# Patient Record
Sex: Male | Born: 1950 | Race: Black or African American | Hispanic: No | Marital: Married | Smoking: Former smoker
Health system: Southern US, Community
[De-identification: ages and names within clinical notes are randomized; demographics above are authoritative.]

## PROBLEM LIST (undated history)

## (undated) DIAGNOSIS — C801 Malignant (primary) neoplasm, unspecified: Secondary | ICD-10-CM

## (undated) DIAGNOSIS — I82401 Acute embolism and thrombosis of unspecified deep veins of right lower extremity: Secondary | ICD-10-CM

## (undated) DIAGNOSIS — D649 Anemia, unspecified: Secondary | ICD-10-CM

---

## 2008-04-16 HISTORY — PX: DENTAL SURGERY: SHX609

## 2018-04-14 ENCOUNTER — Inpatient Hospital Stay (HOSPITAL_COMMUNITY)
Admission: EM | Admit: 2018-04-14 | Discharge: 2018-04-22 | DRG: 329 | Disposition: A | Discharge status: Home / Self Care | Payer: PRIVATE HEALTH INSURANCE | Attending: Internal Medicine | Admitting: Internal Medicine

## 2018-04-14 ENCOUNTER — Encounter (HOSPITAL_COMMUNITY): Payer: Self-pay | Admitting: Emergency Medicine

## 2018-04-14 ENCOUNTER — Emergency Department (HOSPITAL_COMMUNITY): Payer: PRIVATE HEALTH INSURANCE

## 2018-04-14 DIAGNOSIS — K5669 Other partial intestinal obstruction: Secondary | ICD-10-CM | POA: Diagnosis not present

## 2018-04-14 DIAGNOSIS — N289 Disorder of kidney and ureter, unspecified: Secondary | ICD-10-CM

## 2018-04-14 DIAGNOSIS — Z681 Body mass index (BMI) 19 or less, adult: Secondary | ICD-10-CM

## 2018-04-14 DIAGNOSIS — C772 Secondary and unspecified malignant neoplasm of intra-abdominal lymph nodes: Secondary | ICD-10-CM | POA: Diagnosis present

## 2018-04-14 DIAGNOSIS — N3289 Other specified disorders of bladder: Secondary | ICD-10-CM | POA: Insufficient documentation

## 2018-04-14 DIAGNOSIS — R630 Anorexia: Secondary | ICD-10-CM | POA: Diagnosis present

## 2018-04-14 DIAGNOSIS — R531 Weakness: Secondary | ICD-10-CM

## 2018-04-14 DIAGNOSIS — Z91018 Allergy to other foods: Secondary | ICD-10-CM

## 2018-04-14 DIAGNOSIS — K56609 Unspecified intestinal obstruction, unspecified as to partial versus complete obstruction: Secondary | ICD-10-CM

## 2018-04-14 DIAGNOSIS — Z978 Presence of other specified devices: Secondary | ICD-10-CM

## 2018-04-14 DIAGNOSIS — N131 Hydronephrosis with ureteral stricture, not elsewhere classified: Secondary | ICD-10-CM | POA: Diagnosis present

## 2018-04-14 DIAGNOSIS — K56600 Partial intestinal obstruction, unspecified as to cause: Secondary | ICD-10-CM | POA: Diagnosis present

## 2018-04-14 DIAGNOSIS — C7802 Secondary malignant neoplasm of left lung: Secondary | ICD-10-CM | POA: Diagnosis present

## 2018-04-14 DIAGNOSIS — C7801 Secondary malignant neoplasm of right lung: Secondary | ICD-10-CM | POA: Diagnosis present

## 2018-04-14 DIAGNOSIS — Z515 Encounter for palliative care: Secondary | ICD-10-CM

## 2018-04-14 DIAGNOSIS — R918 Other nonspecific abnormal finding of lung field: Secondary | ICD-10-CM

## 2018-04-14 DIAGNOSIS — D649 Anemia, unspecified: Secondary | ICD-10-CM | POA: Diagnosis present

## 2018-04-14 DIAGNOSIS — E876 Hypokalemia: Secondary | ICD-10-CM | POA: Diagnosis not present

## 2018-04-14 DIAGNOSIS — R809 Proteinuria, unspecified: Secondary | ICD-10-CM | POA: Diagnosis present

## 2018-04-14 DIAGNOSIS — Z7982 Long term (current) use of aspirin: Secondary | ICD-10-CM

## 2018-04-14 DIAGNOSIS — D500 Iron deficiency anemia secondary to blood loss (chronic): Secondary | ICD-10-CM | POA: Diagnosis present

## 2018-04-14 DIAGNOSIS — Z23 Encounter for immunization: Secondary | ICD-10-CM

## 2018-04-14 DIAGNOSIS — C787 Secondary malignant neoplasm of liver and intrahepatic bile duct: Secondary | ICD-10-CM | POA: Diagnosis present

## 2018-04-14 DIAGNOSIS — C679 Malignant neoplasm of bladder, unspecified: Secondary | ICD-10-CM | POA: Diagnosis present

## 2018-04-14 DIAGNOSIS — J189 Pneumonia, unspecified organism: Secondary | ICD-10-CM | POA: Diagnosis not present

## 2018-04-14 DIAGNOSIS — R3129 Other microscopic hematuria: Secondary | ICD-10-CM | POA: Diagnosis present

## 2018-04-14 DIAGNOSIS — K6389 Other specified diseases of intestine: Secondary | ICD-10-CM | POA: Diagnosis not present

## 2018-04-14 DIAGNOSIS — Z7189 Other specified counseling: Secondary | ICD-10-CM

## 2018-04-14 DIAGNOSIS — R634 Abnormal weight loss: Secondary | ICD-10-CM | POA: Diagnosis present

## 2018-04-14 DIAGNOSIS — C187 Malignant neoplasm of sigmoid colon: Secondary | ICD-10-CM

## 2018-04-14 DIAGNOSIS — N189 Chronic kidney disease, unspecified: Secondary | ICD-10-CM | POA: Diagnosis present

## 2018-04-14 DIAGNOSIS — R160 Hepatomegaly, not elsewhere classified: Secondary | ICD-10-CM | POA: Diagnosis present

## 2018-04-14 DIAGNOSIS — N138 Other obstructive and reflux uropathy: Secondary | ICD-10-CM | POA: Diagnosis not present

## 2018-04-14 DIAGNOSIS — E870 Hyperosmolality and hypernatremia: Secondary | ICD-10-CM | POA: Diagnosis not present

## 2018-04-14 DIAGNOSIS — R000 Tachycardia, unspecified: Secondary | ICD-10-CM | POA: Diagnosis present

## 2018-04-14 DIAGNOSIS — K228 Other specified diseases of esophagus: Secondary | ICD-10-CM | POA: Diagnosis present

## 2018-04-14 DIAGNOSIS — K632 Fistula of intestine: Secondary | ICD-10-CM | POA: Diagnosis present

## 2018-04-14 DIAGNOSIS — E860 Dehydration: Secondary | ICD-10-CM | POA: Diagnosis present

## 2018-04-14 DIAGNOSIS — Z5111 Encounter for antineoplastic chemotherapy: Secondary | ICD-10-CM

## 2018-04-14 DIAGNOSIS — N179 Acute kidney failure, unspecified: Secondary | ICD-10-CM | POA: Diagnosis present

## 2018-04-14 DIAGNOSIS — K219 Gastro-esophageal reflux disease without esophagitis: Secondary | ICD-10-CM | POA: Diagnosis present

## 2018-04-14 DIAGNOSIS — R1900 Intra-abdominal and pelvic swelling, mass and lump, unspecified site: Secondary | ICD-10-CM

## 2018-04-14 DIAGNOSIS — Z0189 Encounter for other specified special examinations: Secondary | ICD-10-CM

## 2018-04-14 LAB — COMPREHENSIVE METABOLIC PANEL
ALT: 26 U/L (ref 0–44)
AST: 47 U/L — ABNORMAL HIGH (ref 15–41)
Albumin: 3.4 g/dL — ABNORMAL LOW (ref 3.5–5.0)
Alkaline Phosphatase: 83 U/L (ref 38–126)
Anion gap: 15 (ref 5–15)
BUN: 75 mg/dL — ABNORMAL HIGH (ref 8–23)
CO2: 18 mmol/L — ABNORMAL LOW (ref 22–32)
Calcium: 9 mg/dL (ref 8.9–10.3)
Chloride: 111 mmol/L (ref 98–111)
Creatinine, Ser: 2.83 mg/dL — ABNORMAL HIGH (ref 0.61–1.24)
GFR calc Af Amer: 25 mL/min — ABNORMAL LOW (ref >60)
GFR calc non Af Amer: 22 mL/min — ABNORMAL LOW (ref >60)
Glucose, Bld: 125 mg/dL — ABNORMAL HIGH (ref 70–99)
Potassium: 4 mmol/L (ref 3.5–5.1)
SODIUM: 144 mmol/L (ref 135–145)
Total Bilirubin: 1 mg/dL (ref 0.3–1.2)
Total Protein: 7.3 g/dL (ref 6.5–8.1)

## 2018-04-14 LAB — CBC
HCT: 29.3 % — ABNORMAL LOW (ref 39.0–52.0)
Hemoglobin: 8.9 g/dL — ABNORMAL LOW (ref 13.0–17.0)
MCH: 29.2 pg (ref 26.0–34.0)
MCHC: 30.4 g/dL (ref 30.0–36.0)
MCV: 96.1 fL (ref 80.0–100.0)
Platelets: 315 10*3/uL (ref 150–400)
RBC: 3.05 MIL/uL — ABNORMAL LOW (ref 4.22–5.81)
RDW: 15 % (ref 11.5–15.5)
WBC: 20.3 10*3/uL — ABNORMAL HIGH (ref 4.0–10.5)
nRBC: 0 % (ref 0.0–0.2)

## 2018-04-14 LAB — POC OCCULT BLOOD, ED: Fecal Occult Bld: NEGATIVE

## 2018-04-14 LAB — URINALYSIS, ROUTINE W REFLEX MICROSCOPIC
Bilirubin Urine: NEGATIVE
Glucose, UA: NEGATIVE mg/dL
Ketones, ur: NEGATIVE mg/dL
Leukocytes, UA: NEGATIVE
Nitrite: NEGATIVE
PROTEIN: 100 mg/dL — AB
RBC / HPF: >50 RBC/hpf — ABNORMAL HIGH (ref 0–5)
Specific Gravity, Urine: 1.021 (ref 1.005–1.030)
pH: 5 (ref 5.0–8.0)

## 2018-04-14 LAB — I-STAT CG4 LACTIC ACID, ED
Lactic Acid, Venous: 3.65 mmol/L (ref 0.5–1.9)
Lactic Acid, Venous: 1.86 mmol/L (ref 0.5–1.9)

## 2018-04-14 LAB — LIPASE, BLOOD: Lipase: 23 U/L (ref 11–51)

## 2018-04-14 MED ORDER — SODIUM CHLORIDE 0.9 % IV SOLN
1.0000 g | Freq: Every day | INTRAVENOUS | Status: DC
  Administered 2018-04-14 – 2018-04-18 (×5): 1 g via INTRAVENOUS
  Filled 2018-04-14: qty 10
  Filled 2018-04-14 (×4): qty 1

## 2018-04-14 MED ORDER — MORPHINE SULFATE (PF) 2 MG/ML IV SOLN
1.0000 mg | INTRAVENOUS | Status: DC | PRN
  Filled 2018-04-14: qty 1

## 2018-04-14 MED ORDER — ACETAMINOPHEN 650 MG RE SUPP: 650.0000 mg | Freq: Four times a day (QID) | RECTAL | Status: DC | PRN

## 2018-04-14 MED ORDER — SODIUM CHLORIDE 0.9 % IV BOLUS
1000.0000 mL | Freq: Once | INTRAVENOUS | Status: AC
  Administered 2018-04-14: 1000 mL via INTRAVENOUS

## 2018-04-14 MED ORDER — SODIUM CHLORIDE 0.9 % IV SOLN
INTRAVENOUS | Status: AC
  Administered 2018-04-14 – 2018-04-15 (×2): via INTRAVENOUS

## 2018-04-14 MED ORDER — ONDANSETRON HCL 4 MG PO TABS: 4.0000 mg | ORAL_TABLET | Freq: Four times a day (QID) | ORAL | Status: DC | PRN

## 2018-04-14 MED ORDER — ONDANSETRON HCL 4 MG/2ML IJ SOLN: 4.0000 mg | Freq: Four times a day (QID) | INTRAMUSCULAR | Status: DC | PRN

## 2018-04-14 MED ORDER — SODIUM CHLORIDE 0.9 % IV SOLN
500.0000 mg | Freq: Every day | INTRAVENOUS | Status: DC
  Administered 2018-04-14 – 2018-04-18 (×5): 500 mg via INTRAVENOUS
  Filled 2018-04-14 (×5): qty 500

## 2018-04-14 MED ORDER — ACETAMINOPHEN 325 MG PO TABS: 650.0000 mg | ORAL_TABLET | Freq: Four times a day (QID) | ORAL | Status: DC | PRN

## 2018-04-14 MED ORDER — LORAZEPAM 0.5 MG PO TABS: 0.5000 mg | ORAL_TABLET | Freq: Four times a day (QID) | ORAL | Status: DC | PRN

## 2018-04-14 MED ORDER — HEPARIN SODIUM (PORCINE) 5000 UNIT/ML IJ SOLN: 5000.0000 [IU] | Freq: Three times a day (TID) | INTRAMUSCULAR | Status: DC

## 2018-04-14 NOTE — ED Triage Notes (Signed)
Patient here from [facility] with complaints of weight loss of 30lbs in 4 months. Abd pain all over. Abd x-ray and chest x-ray done. Elevated WBC, and low Hgb.

## 2018-04-14 NOTE — ED Provider Notes (Addendum)
[facility] DEPT Provider Note   CSN: [id number] Arrival date & time: 04/14/18  1648     History   Chief Complaint Chief Complaint  Patient presents with  . Weight Loss  . Abdominal Pain  . Abnormal Lab    HPI Miguel Gamble is a 67 y.o. male presenting for evaluation of weight loss, decreased appetite, and abnormal bowel movements.  Patient states that the past 3 months, he has had a 40 pound weight loss.  This began after he felt like he had a food reaction to eating lots of garlic.  He had several months of diarrhea and weight loss, although this improved slightly before he started losing weight again.  Over the past 2 months, he reports decreased appetite.  He states his abdomen feels swollen.  As symptoms are worsening, he went to [facility] walk-in clinic today, where he was found to have abnormal labs including leukocytosis and anemia.  X-ray was abnormal, possible bronchogenic pna.  X-ray of the abdomen showed distended bowel, possible obstruction.  As such, is recommended he come to the ER.  Patient states he is currently without abdominal pain.  He reports some mild nausea, but no vomiting.  He denies fevers, chills, chest pain, shortness of breath, cough, or urinary symptoms.  Patient states he has no medical problems, takes no medications daily. He denies night sweats.   HPI  History reviewed. No pertinent past medical history.  There are no active problems to display for this patient.   History reviewed. No pertinent surgical history.      Home Medications    Prior to Admission medications   Medication Sig Start Date End Date Taking? Authorizing Provider  aspirin 325 MG EC tablet Take 325 mg by mouth every 6 (six) hours as needed for pain.   Yes [provider]    Family History No family history on file.  Social History Social History   Tobacco Use  . Smoking status: Never Smoker  . Smokeless tobacco: Never Used    Substance Use Topics  . Alcohol use: Not on file  . Drug use: Not on file     Allergies   Other   Review of Systems Review of Systems  Constitutional: Positive for appetite change and unexpected weight change.  Gastrointestinal: Positive for abdominal distention and nausea.  All other systems reviewed and are negative.    Physical Exam Updated Vital Signs BP 133/82   Pulse (!) 103   Temp 97.8 F (36.6 C) (Oral)   Resp 18   Ht 5\' 8"  (1.727 m)   Wt 57.2 kg   SpO2 98%   BMI 19.16 kg/m   Physical Exam Vitals signs and nursing note reviewed.  Constitutional:      General: He is not in acute distress.    Comments: Gaunt appearing male.  Appears dehydrated, but in no acute distress  HENT:     Head: Normocephalic and atraumatic.     Comments: MM dry    Mouth/Throat:     Mouth: Mucous membranes are dry.  Eyes:     Conjunctiva/sclera: Conjunctivae normal.     Pupils: Pupils are equal, round, and reactive to light.  Neck:     Musculoskeletal: Normal range of motion and neck supple.  Cardiovascular:     Rate and Rhythm: Regular rhythm. Tachycardia present.     Pulses: Normal pulses.     Comments: Tachycardic around 110 Pulmonary:     Effort: Pulmonary effort is  normal. No respiratory distress.     Breath sounds: Normal breath sounds. No wheezing.  Abdominal:     General: There is distension.     Palpations: Abdomen is soft.     Tenderness: There is no abdominal tenderness. There is no guarding or rebound.     Comments: abd appears distended and tight. No guarding or tenderness  Musculoskeletal: Normal range of motion.  Skin:    General: Skin is warm and dry.     Capillary Refill: Capillary refill takes less than 2 seconds.  Neurological:     Mental Status: He is oriented to person, place, and time.      ED Treatments / Results  Labs (all labs ordered are listed, but only abnormal results are displayed) Labs Reviewed  COMPREHENSIVE METABOLIC PANEL -  Abnormal; Notable for the following components:      Result Value   CO2 18 (*)    Glucose, Bld 125 (*)    BUN 75 (*)    Creatinine, Ser 2.83 (*)    Albumin 3.4 (*)    AST 47 (*)    GFR calc non Af Amer 22 (*)    GFR calc Af Amer 25 (*)    All other components within normal limits  CBC - Abnormal; Notable for the following components:   WBC 20.3 (*)    RBC 3.05 (*)    Hemoglobin 8.9 (*)    HCT 29.3 (*)    All other components within normal limits  URINALYSIS, ROUTINE W REFLEX MICROSCOPIC - Abnormal; Notable for the following components:   Color, Urine AMBER (*)    APPearance CLOUDY (*)    Hgb urine dipstick LARGE (*)    Protein, ur 100 (*)    RBC / HPF >50 (*)    Bacteria, UA RARE (*)    All other components within normal limits  I-STAT CG4 LACTIC ACID, ED - Abnormal; Notable for the following components:   Lactic Acid, Venous 3.65 (*)    All other components within normal limits  LIPASE, BLOOD  POC OCCULT BLOOD, ED  I-STAT CG4 LACTIC ACID, ED  TYPE AND SCREEN  ABO/RH    EKG None  Radiology Ct Abdomen Pelvis Wo Contrast  Result Date: 04/14/2018 CLINICAL DATA:  Chronic weight loss. Abnormal radiograph. Further evaluation requested. EXAM: CT CHEST, ABDOMEN AND PELVIS WITHOUT CONTRAST TECHNIQUE: Multidetector CT imaging of the chest, abdomen and pelvis was performed following the standard protocol without IV contrast. COMPARISON:  None. FINDINGS: CT CHEST FINDINGS Cardiovascular: The heart is normal in size. Mild calcification is noted at the aortic arch. The great vessels are unremarkable in appearance. Mediastinum/Nodes: The esophagus is filled with contrast, which may reflect gastroesophageal reflux or esophageal dysmotility. Wall thickening along the esophagus may reflect esophagitis or possibly metastatic disease. Mild achalasia cannot be excluded. Mild coronary artery calcification is seen. An enlarged 1.8 cm precarinal node is suspicious for metastatic disease. A trace  pericardial effusion is noted. The visualized portions of the thyroid gland are unremarkable. No axillary lymphadenopathy is seen. Lungs/Pleura: Patchy bilateral airspace opacities are noted, predominantly at the right upper lobe and left lower lobe, concerning for pneumonia. Lymphangitic spread of tumor could conceivably have a similar appearance. Multiple large pulmonary nodules are noted bilaterally, measuring up to 1.7 cm in size, compatible with metastatic disease. No pleural effusion or pneumothorax is seen. Musculoskeletal: No acute osseous abnormalities are identified. The visualized musculature is unremarkable in appearance. CT ABDOMEN PELVIS FINDINGS Hepatobiliary: Vague lesions  within the liver measure up to 3.5 cm in size, compatible with metastatic disease. The gallbladder is not definitely characterized. The common bile duct is normal in caliber. Pancreas: The pancreas is within normal limits. Peripancreatic nodes measure up to 1.4 cm in short axis, concerning for metastatic disease. Spleen: The spleen is diminutive and unremarkable in appearance. Adrenals/Urinary Tract: The adrenal glands are grossly unremarkable in appearance. Scattered bilateral renal cysts are noted, more prominent on the right. There is mild chronic left-sided hydronephrosis, with prominence of the proximal left ureter to the level of the lower abdomen, reflecting obstruction by the large lower abdominal and pelvic mass. No renal or ureteral stones are identified. Mild left-sided perinephric stranding is noted. Stomach/Bowel: There is diffuse dilatation of small-bowel loops to 6.0 cm in diameter, and distention of the cecum to 12.0 cm in diameter. The small and large bowel are diffusely dilated. The stomach is largely filled with contrast. Distention extends to the level of the proximal sigmoid colon. 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 Wall thickening is noted along the adjacent small and large bowel loops, and the mass measures approximately 16 x 9 cm. Wall thickening extends to the rectum, with diffuse presacral stranding. This most likely arises from the sigmoid colon, though a small bowel or bladder source is also possible. Vascular/Lymphatic: Scattered calcification is seen along the abdominal aorta and its branches. The abdominal aorta is otherwise grossly unremarkable. The inferior vena cava is grossly unremarkable. Scattered enlarged mesenteric and retroperitoneal nodes are seen, measuring up to 1.5 cm in short axis. Vague mass tracks directly anterior to the aortic bifurcation and along the pelvic sidewall bilaterally. A 1.9 cm node is seen along the left external iliac vessels. Reproductive: Multiple masses are noted within the bladder, with diffuse irregular bladder wall thickening at the dome of the bladder, reflecting diffuse spread of disease. The prostate is borderline enlarged, measuring 4.9 cm in transverse dimension. Other: Trace fluid is noted at a small right inguinal hernia. Musculoskeletal: No acute osseous abnormalities are identified. The visualized musculature is unremarkable in appearance. IMPRESSION: 1. 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 The mass measures 16 x 9 cm. Wall thickening extends along the adjacent small and large bowel loops. 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 2. Marked diffuse dilatation of the small and large bowel loops, reflecting partial bowel obstruction by the mass. 3. Extensive retroperitoneal, mesenteric and pelvic sidewall metastatic lymphadenopathy  noted. Enlarged mediastinal node also noted, concerning for metastatic disease. 4. Multiple pulmonary nodules noted bilaterally, compatible with metastatic disease. 5. Wall thickening along the esophagus may reflect esophagitis or possibly metastatic disease. Esophagus filled with contrast, possibly reflecting gastroesophageal reflux or esophageal dysmotility. Mild achalasia cannot be excluded. 6. Mild chronic left-sided hydronephrosis, reflecting obstruction by the large lower abdominal and pelvic mass. 7. Multiple masses within the bladder, with diffuse irregular bladder wall thickening at the dome of the bladder, reflecting diffuse spread of malignancy. 8. Scattered hepatic metastases noted. 9. Patchy bilateral airspace opacities, predominantly at the right upper lobe and  left lower lobe, concerning for pneumonia. Lymphangitic spread of tumor could conceivably have a similar appearance. 10. Bilateral renal cysts. 11. Trace pericardial effusion noted. Aortic Atherosclerosis (ICD10-I70.0). These results were called by telephone at the time of interpretation on 04/14/2018 at 9:41 pm to [facility] PA, who verbally acknowledged these results. Electronically Signed   By: Garald Balding M.D.   On: 04/14/2018 21:48   Ct Chest Wo Contrast  Result Date: 04/14/2018 CLINICAL DATA:  Chronic weight loss. Abnormal radiograph. Further evaluation requested. EXAM: CT CHEST, ABDOMEN AND PELVIS WITHOUT CONTRAST TECHNIQUE: Multidetector CT imaging of the chest, abdomen and pelvis was performed following the standard protocol without IV contrast. COMPARISON:  None. FINDINGS: CT CHEST FINDINGS Cardiovascular: The heart is normal in size. Mild calcification is noted at the aortic arch. The great vessels are unremarkable in appearance. Mediastinum/Nodes: The esophagus is filled with contrast, which may reflect gastroesophageal reflux or esophageal dysmotility. Wall thickening along the esophagus may reflect esophagitis or  possibly metastatic disease. Mild achalasia cannot be excluded. Mild coronary artery calcification is seen. An enlarged 1.8 cm precarinal node is suspicious for metastatic disease. A trace pericardial effusion is noted. The visualized portions of the thyroid gland are unremarkable. No axillary lymphadenopathy is seen. Lungs/Pleura: Patchy bilateral airspace opacities are noted, predominantly at the right upper lobe and left lower lobe, concerning for pneumonia. Lymphangitic spread of tumor could conceivably have a similar appearance. Multiple large pulmonary nodules are noted bilaterally, measuring up to 1.7 cm in size, compatible with metastatic disease. No pleural effusion or pneumothorax is seen. Musculoskeletal: No acute osseous abnormalities are identified. The visualized musculature is unremarkable in appearance. CT ABDOMEN PELVIS FINDINGS Hepatobiliary: Vague lesions within the liver measure up to 3.5 cm in size, compatible with metastatic disease. The gallbladder is not definitely characterized. The common bile duct is normal in caliber. Pancreas: The pancreas is within normal limits. Peripancreatic nodes measure up to 1.4 cm in short axis, concerning for metastatic disease. Spleen: The spleen is diminutive and unremarkable in appearance. Adrenals/Urinary Tract: The adrenal glands are grossly unremarkable in appearance. Scattered bilateral renal cysts are noted, more prominent on the right. There is mild chronic left-sided hydronephrosis, with prominence of the proximal left ureter to the level of the lower abdomen, reflecting obstruction by the large lower abdominal and pelvic mass. No renal or ureteral stones are identified. Mild left-sided perinephric stranding is noted. Stomach/Bowel: There is diffuse dilatation of small-bowel loops to 6.0 cm in diameter, and distention of the cecum to 12.0 cm in diameter. The small and large bowel are diffusely dilated. The stomach is largely filled with contrast.  Distention extends to the level of the proximal sigmoid colon. 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 Wall thickening is noted along the adjacent small and large bowel loops, and the mass measures approximately 16 x 9 cm. Wall thickening extends to the rectum, with diffuse presacral stranding. This most likely arises from the sigmoid colon, though a small bowel or bladder source is also possible. Vascular/Lymphatic: Scattered calcification is seen along the abdominal aorta and its branches. The abdominal aorta is otherwise grossly unremarkable. The inferior vena cava is grossly unremarkable. Scattered enlarged mesenteric and retroperitoneal nodes are seen, measuring up to 1.5 cm in short axis. Vague mass tracks directly anterior to the  aortic bifurcation and along the pelvic sidewall bilaterally. A 1.9 cm node is seen along the left external iliac vessels. Reproductive: Multiple masses are noted within the bladder, with diffuse irregular bladder wall thickening at the dome of the bladder, reflecting diffuse spread of disease. The prostate is borderline enlarged, measuring 4.9 cm in transverse dimension. Other: Trace fluid is noted at a small right inguinal hernia. Musculoskeletal: No acute osseous abnormalities are identified. The visualized musculature is unremarkable in appearance. IMPRESSION: 1. 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 The mass measures 16 x 9 cm. Wall thickening extends along the adjacent small and large bowel loops. 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 2. Marked  diffuse dilatation of the small and large bowel loops, reflecting partial bowel obstruction by the mass. 3. Extensive retroperitoneal, mesenteric and pelvic sidewall metastatic lymphadenopathy noted. Enlarged mediastinal node also noted, concerning for metastatic disease. 4. Multiple pulmonary nodules noted bilaterally, compatible with metastatic disease. 5. Wall thickening along the esophagus may reflect esophagitis or possibly metastatic disease. Esophagus filled with contrast, possibly reflecting gastroesophageal reflux or esophageal dysmotility. Mild achalasia cannot be excluded. 6. Mild chronic left-sided hydronephrosis, reflecting obstruction by the large lower abdominal and pelvic mass. 7. Multiple masses within the bladder, with diffuse irregular bladder wall thickening at the dome of the bladder, reflecting diffuse spread of malignancy. 8. Scattered hepatic metastases noted. 9. Patchy bilateral airspace opacities, predominantly at the right upper lobe and left lower lobe, concerning for pneumonia. Lymphangitic spread of tumor could conceivably have a similar appearance. 10. Bilateral renal cysts. 11. Trace pericardial effusion noted. Aortic Atherosclerosis (ICD10-I70.0). These results were called by telephone at the time of interpretation on 04/14/2018 at 9:41 pm to [facility] PA, who verbally acknowledged these results. Electronically Signed   By: Garald Balding M.D.   On: 04/14/2018 21:48    Procedures Procedures (including critical care time)  Medications Ordered in ED Medications  sodium chloride 0.9 % bolus 1,000 mL (0 mLs Intravenous Stopped 04/14/18 1926)     Initial Impression / Assessment and Plan / ED Course  I have reviewed the triage vital signs and the nursing notes.  Pertinent labs & imaging results that were available during my care of the patient were reviewed by me and considered in my medical decision making (see chart for details).     Pt presenting for evaluation  of weight loss, abnormal bowel movements, and abnormal labs.  Physical exam concerning, patient appears: And dehydrated.  Abdomen is distended and tight without tenderness or guarding.  Considering weight loss, concern for possible cancer.  Will obtain labs and imaging for further evaluation.  Labs show leukocytosis at 20, and anemia 8.9.  Creatinine elevated at 2.8.  No baseline to compare.  UA with large blood, no UTI.  CTs pending.  CT with concerning finding of abdominal pelvic mass measuring 16 x 9 cm.  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  Patient with diffuse metastatic disease along the retroperitoneal wall.  Bladder masses noted.  Liver and pulmonary mets.  Patient with possible achalasia, and a possible pneumonia.  Secondary to the mass, patient with bowel obstruction and chronic left-sided hydronephrosis.  Pt without fevers or cough, doubt pna. CT likely related  to CA.  Discussed findings with patient and wife.  Considering the extent of cancer, associated obstruction, AKI, and anemia, will call for admission.  Case discussed with attending, Dr. Venora Maples agrees to plan.  Discussed with Dr. Myna Hidalgo, patient to be admitted to [facility] service.  Requesting consult with general surgery for bowel obstruction.  Discussed with Dr. Barry Dienes from general surgery, who recommends NG tube and will follow.   Final Clinical Impressions(s) / ED Diagnoses   Final diagnoses:  Abdominal mass, unspecified abdominal location  Intestinal obstruction, unspecified cause, unspecified whether partial or complete (HCC)  AKI (acute kidney injury) ([place])  Anemia, unspecified type    ED Discharge Orders    None       Franchot Heidelberg, PA-C 04/14/18 2255    Franchot Heidelberg, PA-C 04/14/18 2327    Jola Schmidt, MD 04/14/18 2348

## 2018-04-14 NOTE — H&P (Signed)
History and Physical    Miguel Gamble EYC:[id number] DOB: 1950-05-01 DOA: 04/14/2018  PCP: Wenda Low, MD   Patient coming from: Home   Chief Complaint: Wt loss, loss of appetite,   HPI: Miguel Gamble is a 67 y.o. male who denies any significant past medical history, now presenting to the emergency department with several months of progressive abdominal discomfort, weight loss, intermittent loose stools, and loss of appetite.  Patient reports that he began to develop some vague abdominal discomfort over the summer, had loss of appetite at that time with weight loss, but seemed to improve some before his symptoms began to worsen again over the past couple months.  He continues to move his bowels, reports a semi-formed stool this morning, denies any nausea or vomiting, and denies abdominal pain per se, but reports abdominal discomfort and loss of appetite.  He denies any fevers or chills, denies chest pain, and denies any significant cough.  He has never had a colonoscopy.  He denies melena or hematochezia.  ED Course: Upon arrival to the ED, patient is found to be afebrile, saturating well on room air, slightly tachycardic, and with stable blood pressure.  Chemistry panel is notable for a creatinine of 2.83 with no priors for comparison.  CBC features a leukocytosis to 20,300 and a normocytic anemia with hemoglobin of 8.9.  Fecal occult blood testing is negative.  Urinalysis notable for microscopic hematuria and proteinuria.  Initial lactic acid is 3.75, normalizing after 1 L of normal saline.  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  Patient was given a liter of normal saline in the ED, remains hemodynamically stable, and will be observed for further  evaluation and management.  Review of Systems:  All other systems reviewed and apart from HPI, are negative.  History reviewed. No pertinent past medical history.  History reviewed. No pertinent surgical history.   reports that he has never smoked. He has never used smokeless tobacco. No history on file for alcohol and drug.  Allergies  Allergen Reactions  . Other Rash    Wheat straw    Family History  Problem Relation Age of Onset  . Lung cancer Mother   . Emphysema Father   . Heart failure Father      Prior to Admission medications   Medication Sig Start Date End Date Taking? Authorizing Provider  aspirin 325 MG EC tablet Take 325 mg by mouth every 6 (six) hours as needed for pain.   Yes [provider]    Physical Exam: Vitals:   04/14/18 1930 04/14/18 2030 04/14/18 2106 04/14/18 2158  BP: 130/81 129/81 122/86 133/82  Pulse: 99 99 98 (!) 103  Resp: 20 19 19 18   Temp:      TempSrc:      SpO2: 99% 99% 97% 98%  Weight:      Height:        Constitutional: NAD, calm, cachectic  Eyes: PERTLA, lids and conjunctivae normal ENMT: Mucous membranes are moist. Posterior pharynx clear of any exudate or lesions.   Neck: normal, supple, no masses, no thyromegaly Respiratory: clear to auscultation bilaterally, no wheezing, no crackles. Normal respiratory effort.    Cardiovascular: S1 & S2 heard, regular rate and rhythm. No extremity edema.   Abdomen: Distended, firm, non-tender. Bowel sounds active.  Musculoskeletal: no clubbing /  cyanosis. No joint deformity upper and lower extremities.   Skin: no significant rashes, lesions, ulcers. Poor turgor. Neurologic: CN 2-12 grossly intact. Sensation intact. Moving all extremities.  Psychiatric: Alert and oriented x 3. Calm, cooperative.    Labs on Admission: I have personally reviewed following labs and imaging studies  CBC: Recent Labs  Lab 04/14/18 1801  WBC 20.3*  HGB 8.9*  HCT 29.3*  MCV 96.1  PLT 195    Basic Metabolic Panel: Recent Labs  Lab 04/14/18 1801  NA 144  K 4.0  CL 111  CO2 18*  GLUCOSE 125*  BUN 75*  CREATININE 2.83*  CALCIUM 9.0   GFR: Estimated Creatinine Clearance: 20.2 mL/min (A) (by C-G formula based on SCr of 2.83 mg/dL (H)). Liver Function Tests: Recent Labs  Lab 04/14/18 1801  AST 47*  ALT 26  ALKPHOS 83  BILITOT 1.0  PROT 7.3  ALBUMIN 3.4*   Recent Labs  Lab 04/14/18 1801  LIPASE 23   No results for input(s): AMMONIA in the last 168 hours. Coagulation Profile: No results for input(s): INR, PROTIME in the last 168 hours. Cardiac Enzymes: No results for input(s): CKTOTAL, CKMB, CKMBINDEX, TROPONINI in the last 168 hours. BNP (last 3 results) No results for input(s): PROBNP in the last 8760 hours. HbA1C: No results for input(s): HGBA1C in the last 72 hours. CBG: No results for input(s): GLUCAP in the last 168 hours. Lipid Profile: No results for input(s): CHOL, HDL, LDLCALC, TRIG, CHOLHDL, LDLDIRECT in the last 72 hours. Thyroid Function Tests: No results for input(s): TSH, T4TOTAL, FREET4, T3FREE, THYROIDAB in the last 72 hours. Anemia Panel: No results for input(s): VITAMINB12, FOLATE, FERRITIN, TIBC, IRON, RETICCTPCT in the last 72 hours. Urine analysis:    Component Value Date/Time   COLORURINE AMBER (A) 04/14/2018 2053   APPEARANCEUR CLOUDY (A) 04/14/2018 2053   LABSPEC 1.021 04/14/2018 2053   PHURINE 5.0 04/14/2018 2053   GLUCOSEU NEGATIVE 04/14/2018 2053   HGBUR LARGE (A) 04/14/2018 2053   BILIRUBINUR NEGATIVE 04/14/2018 2053   [place] 04/14/2018 2053   PROTEINUR 100 (A) 04/14/2018 2053   NITRITE NEGATIVE 04/14/2018 2053   LEUKOCYTESUR NEGATIVE 04/14/2018 2053   Sepsis Labs: @LABRCNTIP (procalcitonin:4,lacticidven:4) )No results found for this or any previous visit (from the past 240 hour(s)).   Radiological Exams on Admission: Ct Abdomen Pelvis Wo Contrast  Result Date: 04/14/2018 CLINICAL DATA:  Chronic  weight loss. Abnormal radiograph. Further evaluation requested. EXAM: CT CHEST, ABDOMEN AND PELVIS WITHOUT CONTRAST TECHNIQUE: Multidetector CT imaging of the chest, abdomen and pelvis was performed following the standard protocol without IV contrast. COMPARISON:  None. FINDINGS: CT CHEST FINDINGS Cardiovascular: The heart is normal in size. Mild calcification is noted at the aortic arch. The great vessels are unremarkable in appearance. Mediastinum/Nodes: The esophagus is filled with contrast, which may reflect gastroesophageal reflux or esophageal dysmotility. Wall thickening along the esophagus may reflect esophagitis or possibly metastatic disease. Mild achalasia cannot be excluded. Mild coronary artery calcification is seen. An enlarged 1.8 cm precarinal node is suspicious for metastatic disease. A trace pericardial effusion is noted. The visualized portions of the thyroid gland are unremarkable. No axillary lymphadenopathy is seen. Lungs/Pleura: Patchy bilateral airspace opacities are noted, predominantly at the right upper lobe and left lower lobe, concerning for pneumonia. Lymphangitic spread of tumor could conceivably have a similar appearance. Multiple large pulmonary nodules are noted bilaterally, measuring up to 1.7 cm in size, compatible with metastatic disease. No pleural effusion or pneumothorax is seen. Musculoskeletal:  No acute osseous abnormalities are identified. The visualized musculature is unremarkable in appearance. CT ABDOMEN PELVIS FINDINGS Hepatobiliary: Vague lesions within the liver measure up to 3.5 cm in size, compatible with metastatic disease. The gallbladder is not definitely characterized. The common bile duct is normal in caliber. Pancreas: The pancreas is within normal limits. Peripancreatic nodes measure up to 1.4 cm in short axis, concerning for metastatic disease. Spleen: The spleen is diminutive and unremarkable in appearance. Adrenals/Urinary Tract: The adrenal glands are  grossly unremarkable in appearance. Scattered bilateral renal cysts are noted, more prominent on the right. There is mild chronic left-sided hydronephrosis, with prominence of the proximal left ureter to the level of the lower abdomen, reflecting obstruction by the large lower abdominal and pelvic mass. No renal or ureteral stones are identified. Mild left-sided perinephric stranding is noted. Stomach/Bowel: There is diffuse dilatation of small-bowel loops to 6.0 cm in diameter, and distention of the cecum to 12.0 cm in diameter. The small and large bowel are diffusely dilated. The stomach is largely filled with contrast. Distention extends to the level of the proximal sigmoid colon. 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 Wall thickening is noted along the adjacent small and large bowel loops, and the mass measures approximately 16 x 9 cm. Wall thickening extends to the rectum, with diffuse presacral stranding. This most likely arises from the sigmoid colon, though a small bowel or bladder source is also possible. Vascular/Lymphatic: Scattered calcification is seen along the abdominal aorta and its branches. The abdominal aorta is otherwise grossly unremarkable. The inferior vena cava is grossly unremarkable. Scattered enlarged mesenteric and retroperitoneal nodes are seen, measuring up to 1.5 cm in short axis. Vague mass tracks directly anterior to the aortic bifurcation and along the pelvic sidewall bilaterally. A 1.9 cm node is seen along the left external iliac vessels. Reproductive: Multiple masses are noted within the bladder, with diffuse irregular bladder wall thickening at the dome of the bladder, reflecting diffuse spread of disease. The prostate is borderline enlarged, measuring 4.9 cm in transverse dimension. Other: Trace  fluid is noted at a small right inguinal hernia. Musculoskeletal: No acute osseous abnormalities are identified. The visualized musculature is unremarkable in appearance. IMPRESSION: 1. 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 The mass measures 16 x 9 cm. Wall thickening extends along the adjacent small and large bowel loops. 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 2. Marked diffuse dilatation of the small and large bowel loops, reflecting partial bowel obstruction by the mass. 3. Extensive retroperitoneal, mesenteric and pelvic sidewall metastatic lymphadenopathy noted. Enlarged mediastinal node also noted, concerning for metastatic disease. 4. Multiple pulmonary nodules noted bilaterally, compatible with metastatic disease. 5. Wall thickening along the esophagus may reflect esophagitis or possibly metastatic disease. Esophagus filled with contrast, possibly reflecting gastroesophageal reflux or esophageal dysmotility. Mild achalasia cannot be excluded. 6. Mild chronic left-sided hydronephrosis, reflecting obstruction by the large lower abdominal and pelvic mass. 7. Multiple masses within the bladder, with diffuse irregular bladder wall thickening at the dome of the bladder, reflecting diffuse  spread of malignancy. 8. Scattered hepatic metastases noted. 9. Patchy bilateral airspace opacities, predominantly at the right upper lobe and left lower lobe, concerning for pneumonia. Lymphangitic spread of tumor could conceivably have a similar appearance. 10. Bilateral renal cysts. 11. Trace pericardial effusion noted. Aortic Atherosclerosis (ICD10-I70.0). These results were called by telephone at the time of interpretation on 04/14/2018 at 9:41 pm to [facility] PA, who  verbally acknowledged these results. Electronically Signed   By: Garald Balding M.D.   On: 04/14/2018 21:48   Ct Chest Wo Contrast  Result Date: 04/14/2018 CLINICAL DATA:  Chronic weight loss. Abnormal radiograph. Further evaluation requested. EXAM: CT CHEST, ABDOMEN AND PELVIS WITHOUT CONTRAST TECHNIQUE: Multidetector CT imaging of the chest, abdomen and pelvis was performed following the standard protocol without IV contrast. COMPARISON:  None. FINDINGS: CT CHEST FINDINGS Cardiovascular: The heart is normal in size. Mild calcification is noted at the aortic arch. The great vessels are unremarkable in appearance. Mediastinum/Nodes: The esophagus is filled with contrast, which may reflect gastroesophageal reflux or esophageal dysmotility. Wall thickening along the esophagus may reflect esophagitis or possibly metastatic disease. Mild achalasia cannot be excluded. Mild coronary artery calcification is seen. An enlarged 1.8 cm precarinal node is suspicious for metastatic disease. A trace pericardial effusion is noted. The visualized portions of the thyroid gland are unremarkable. No axillary lymphadenopathy is seen. Lungs/Pleura: Patchy bilateral airspace opacities are noted, predominantly at the right upper lobe and left lower lobe, concerning for pneumonia. Lymphangitic spread of tumor could conceivably have a similar appearance. Multiple large pulmonary nodules are noted bilaterally, measuring up to 1.7 cm in size, compatible with metastatic disease. No pleural effusion or pneumothorax is seen. Musculoskeletal: No acute osseous abnormalities are identified. The visualized musculature is unremarkable in appearance. CT ABDOMEN PELVIS FINDINGS Hepatobiliary: Vague lesions within the liver measure up to 3.5 cm in size, compatible with metastatic disease. The gallbladder is not definitely characterized. The common bile duct is normal in caliber. Pancreas: The pancreas is within normal limits. Peripancreatic nodes  measure up to 1.4 cm in short axis, concerning for metastatic disease. Spleen: The spleen is diminutive and unremarkable in appearance. Adrenals/Urinary Tract: The adrenal glands are grossly unremarkable in appearance. Scattered bilateral renal cysts are noted, more prominent on the right. There is mild chronic left-sided hydronephrosis, with prominence of the proximal left ureter to the level of the lower abdomen, reflecting obstruction by the large lower abdominal and pelvic mass. No renal or ureteral stones are identified. Mild left-sided perinephric stranding is noted. Stomach/Bowel: There is diffuse dilatation of small-bowel loops to 6.0 cm in diameter, and distention of the cecum to 12.0 cm in diameter. The small and large bowel are diffusely dilated. The stomach is largely filled with contrast. Distention extends to the level of the proximal sigmoid colon. 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 Wall thickening is noted along the adjacent small and large bowel loops, and the mass measures approximately 16 x 9 cm. Wall thickening extends to the rectum, with diffuse presacral stranding. This most likely arises from the sigmoid colon, though a small bowel or bladder source is also possible. Vascular/Lymphatic: Scattered calcification is seen along the abdominal aorta and its branches. The abdominal aorta is otherwise grossly unremarkable. The inferior vena cava is grossly unremarkable. Scattered enlarged mesenteric  and retroperitoneal nodes are seen, measuring up to 1.5 cm in short axis. Vague mass tracks directly anterior to the aortic bifurcation and along the pelvic sidewall bilaterally. A 1.9 cm node is seen along the left external iliac vessels. Reproductive: Multiple masses are noted within the bladder, with diffuse irregular  bladder wall thickening at the dome of the bladder, reflecting diffuse spread of disease. The prostate is borderline enlarged, measuring 4.9 cm in transverse dimension. Other: Trace fluid is noted at a small right inguinal hernia. Musculoskeletal: No acute osseous abnormalities are identified. The visualized musculature is unremarkable in appearance. IMPRESSION: 1. 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 The mass measures 16 x 9 cm. Wall thickening extends along the adjacent small and large bowel loops. 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 2. Marked diffuse dilatation of the small and large bowel loops, reflecting partial bowel obstruction by the mass. 3. Extensive retroperitoneal, mesenteric and pelvic sidewall metastatic lymphadenopathy noted. Enlarged mediastinal node also noted, concerning for metastatic disease. 4. Multiple pulmonary nodules noted bilaterally, compatible with metastatic disease. 5. Wall thickening along the esophagus may reflect esophagitis or possibly metastatic disease. Esophagus filled with contrast, possibly reflecting gastroesophageal reflux or esophageal dysmotility. Mild achalasia cannot be excluded. 6. Mild chronic left-sided hydronephrosis, reflecting obstruction by the large lower abdominal and pelvic mass. 7. Multiple masses within the bladder, with diffuse irregular bladder wall thickening at the dome of the bladder, reflecting diffuse spread of malignancy. 8. Scattered hepatic metastases noted. 9. Patchy bilateral airspace opacities, predominantly at the right upper lobe and left lower lobe, concerning for pneumonia. Lymphangitic spread of tumor could conceivably have a similar appearance. 10. Bilateral renal cysts. 11. Trace  pericardial effusion noted. Aortic Atherosclerosis (ICD10-I70.0). These results were called by telephone at the time of interpretation on 04/14/2018 at 9:41 pm to [facility] PA, who verbally acknowledged these results. Electronically Signed   By: Garald Balding M.D.   On: 04/14/2018 21:48    EKG: Independently reviewed.   Assessment/Plan   1. Intraabdominal masses with partial SBO   - Presents with several months of abdominal discomfort, loss of appetite, wt-loss, and loose stools  - Found to have very large complex mass likely arising from sigmoid colon with partial SBO, fistulae, urinary bladder and liver masses, lung nodules, LAD  - He had BM the day of admission, denies any N/V  - Continue supportive care, next step likely biopsy    2. Renal insufficiency  - SCr is 2.83 on admission with no prior labs to compare  - Mild left-sided nephrosis noted on CT, likely secondary to masses   - Given a 1 liter NS bolus in ED and will be continued on IVF hydration  - Renally-dose medications, avoid nephrotoxins, repeat chem panel in am   3. Normocytic anemia   - Hgb is 8.9 on admission with no prior labs to compare  - Denies melena or hematochezia and FOBT is negative  - Type and screen performed in ED    4. CAP  - Lung nodules noted on CT with PNA vs lymphangitic spread of tumor - Check sputum culture and strep pneumo antigen, treat with Rocephin and azithromycin     DVT prophylaxis: SCD's Code Status: Full  Family  Communication: Wife updated at bedside Consults called: None Admission status: Observation     Vianne Bulls, MD [facility] Pager [phone]  If 7PM-7AM, please contact night-coverage [email or web address] Password [facility]  04/14/2018, 10:38 PM

## 2018-04-15 ENCOUNTER — Inpatient Hospital Stay (HOSPITAL_COMMUNITY): Payer: PRIVATE HEALTH INSURANCE | Admitting: Anesthesiology

## 2018-04-15 ENCOUNTER — Encounter (HOSPITAL_COMMUNITY)
Admission: EM | Disposition: A | Discharge status: Home / Self Care | Payer: Self-pay | Source: Home / Self Care | Attending: Internal Medicine

## 2018-04-15 ENCOUNTER — Other Ambulatory Visit: Payer: Self-pay

## 2018-04-15 ENCOUNTER — Inpatient Hospital Stay (HOSPITAL_COMMUNITY): Payer: PRIVATE HEALTH INSURANCE

## 2018-04-15 ENCOUNTER — Encounter (HOSPITAL_COMMUNITY): Payer: Self-pay | Admitting: *Deleted

## 2018-04-15 DIAGNOSIS — R000 Tachycardia, unspecified: Secondary | ICD-10-CM | POA: Diagnosis present

## 2018-04-15 DIAGNOSIS — C772 Secondary and unspecified malignant neoplasm of intra-abdominal lymph nodes: Secondary | ICD-10-CM | POA: Diagnosis present

## 2018-04-15 DIAGNOSIS — R1900 Intra-abdominal and pelvic swelling, mass and lump, unspecified site: Secondary | ICD-10-CM | POA: Diagnosis not present

## 2018-04-15 DIAGNOSIS — D500 Iron deficiency anemia secondary to blood loss (chronic): Secondary | ICD-10-CM | POA: Diagnosis present

## 2018-04-15 DIAGNOSIS — N138 Other obstructive and reflux uropathy: Secondary | ICD-10-CM | POA: Diagnosis not present

## 2018-04-15 DIAGNOSIS — D649 Anemia, unspecified: Secondary | ICD-10-CM | POA: Diagnosis not present

## 2018-04-15 DIAGNOSIS — R160 Hepatomegaly, not elsewhere classified: Secondary | ICD-10-CM | POA: Diagnosis present

## 2018-04-15 DIAGNOSIS — N131 Hydronephrosis with ureteral stricture, not elsewhere classified: Secondary | ICD-10-CM | POA: Diagnosis present

## 2018-04-15 DIAGNOSIS — Z515 Encounter for palliative care: Secondary | ICD-10-CM | POA: Diagnosis not present

## 2018-04-15 DIAGNOSIS — K56600 Partial intestinal obstruction, unspecified as to cause: Secondary | ICD-10-CM | POA: Diagnosis present

## 2018-04-15 DIAGNOSIS — Z7189 Other specified counseling: Secondary | ICD-10-CM | POA: Diagnosis not present

## 2018-04-15 DIAGNOSIS — C787 Secondary malignant neoplasm of liver and intrahepatic bile duct: Secondary | ICD-10-CM | POA: Diagnosis present

## 2018-04-15 DIAGNOSIS — K632 Fistula of intestine: Secondary | ICD-10-CM | POA: Diagnosis present

## 2018-04-15 DIAGNOSIS — K5669 Other partial intestinal obstruction: Secondary | ICD-10-CM | POA: Diagnosis not present

## 2018-04-15 DIAGNOSIS — C679 Malignant neoplasm of bladder, unspecified: Secondary | ICD-10-CM | POA: Diagnosis present

## 2018-04-15 DIAGNOSIS — J189 Pneumonia, unspecified organism: Secondary | ICD-10-CM | POA: Diagnosis present

## 2018-04-15 DIAGNOSIS — N179 Acute kidney failure, unspecified: Secondary | ICD-10-CM | POA: Diagnosis present

## 2018-04-15 DIAGNOSIS — C187 Malignant neoplasm of sigmoid colon: Secondary | ICD-10-CM | POA: Diagnosis present

## 2018-04-15 DIAGNOSIS — K6389 Other specified diseases of intestine: Secondary | ICD-10-CM | POA: Diagnosis not present

## 2018-04-15 DIAGNOSIS — Z23 Encounter for immunization: Secondary | ICD-10-CM | POA: Diagnosis not present

## 2018-04-15 DIAGNOSIS — K56609 Unspecified intestinal obstruction, unspecified as to partial versus complete obstruction: Secondary | ICD-10-CM | POA: Diagnosis present

## 2018-04-15 DIAGNOSIS — C7802 Secondary malignant neoplasm of left lung: Secondary | ICD-10-CM | POA: Diagnosis present

## 2018-04-15 DIAGNOSIS — R634 Abnormal weight loss: Secondary | ICD-10-CM | POA: Diagnosis present

## 2018-04-15 DIAGNOSIS — K219 Gastro-esophageal reflux disease without esophagitis: Secondary | ICD-10-CM | POA: Diagnosis present

## 2018-04-15 DIAGNOSIS — C7801 Secondary malignant neoplasm of right lung: Secondary | ICD-10-CM | POA: Diagnosis present

## 2018-04-15 DIAGNOSIS — R630 Anorexia: Secondary | ICD-10-CM | POA: Diagnosis present

## 2018-04-15 DIAGNOSIS — E876 Hypokalemia: Secondary | ICD-10-CM | POA: Diagnosis not present

## 2018-04-15 DIAGNOSIS — E860 Dehydration: Secondary | ICD-10-CM | POA: Diagnosis present

## 2018-04-15 DIAGNOSIS — N2889 Other specified disorders of kidney and ureter: Secondary | ICD-10-CM | POA: Diagnosis not present

## 2018-04-15 DIAGNOSIS — N189 Chronic kidney disease, unspecified: Secondary | ICD-10-CM | POA: Diagnosis present

## 2018-04-15 DIAGNOSIS — E870 Hyperosmolality and hypernatremia: Secondary | ICD-10-CM | POA: Diagnosis not present

## 2018-04-15 DIAGNOSIS — R531 Weakness: Secondary | ICD-10-CM | POA: Diagnosis not present

## 2018-04-15 DIAGNOSIS — Z681 Body mass index (BMI) 19 or less, adult: Secondary | ICD-10-CM | POA: Diagnosis not present

## 2018-04-15 HISTORY — PX: CYSTOSCOPY W/ URETERAL STENT PLACEMENT: SHX1429

## 2018-04-15 LAB — CBC WITH DIFFERENTIAL/PLATELET
Abs Immature Granulocytes: 0.09 10*3/uL — ABNORMAL HIGH (ref 0.00–0.07)
BASOS PCT: 0 %
Basophils Absolute: 0 10*3/uL (ref 0.0–0.1)
Eosinophils Absolute: 0 10*3/uL (ref 0.0–0.5)
Eosinophils Relative: 0 %
HCT: 23.7 % — ABNORMAL LOW (ref 39.0–52.0)
Hemoglobin: 7.5 g/dL — ABNORMAL LOW (ref 13.0–17.0)
Immature Granulocytes: 1 %
Lymphocytes Relative: 4 %
Lymphs Abs: 0.6 10*3/uL — ABNORMAL LOW (ref 0.7–4.0)
MCH: 28.6 pg (ref 26.0–34.0)
MCHC: 31.6 g/dL (ref 30.0–36.0)
MCV: 90.5 fL (ref 80.0–100.0)
Monocytes Absolute: 0.8 10*3/uL (ref 0.1–1.0)
Monocytes Relative: 5 %
NEUTROS PCT: 90 %
Neutro Abs: 14.6 10*3/uL — ABNORMAL HIGH (ref 1.7–7.7)
Platelets: 311 10*3/uL (ref 150–400)
RBC: 2.62 MIL/uL — ABNORMAL LOW (ref 4.22–5.81)
RDW: 14.7 % (ref 11.5–15.5)
WBC: 16.1 10*3/uL — ABNORMAL HIGH (ref 4.0–10.5)
nRBC: 0 % (ref 0.0–0.2)

## 2018-04-15 LAB — BASIC METABOLIC PANEL
ANION GAP: 13 (ref 5–15)
BUN: 76 mg/dL — ABNORMAL HIGH (ref 8–23)
CO2: 17 mmol/L — ABNORMAL LOW (ref 22–32)
Calcium: 8.3 mg/dL — ABNORMAL LOW (ref 8.9–10.3)
Chloride: 115 mmol/L — ABNORMAL HIGH (ref 98–111)
Creatinine, Ser: 2.24 mg/dL — ABNORMAL HIGH (ref 0.61–1.24)
GFR calc Af Amer: 34 mL/min — ABNORMAL LOW (ref >60)
GFR calc non Af Amer: 29 mL/min — ABNORMAL LOW (ref >60)
Glucose, Bld: 99 mg/dL (ref 70–99)
Potassium: 3.2 mmol/L — ABNORMAL LOW (ref 3.5–5.1)
Sodium: 145 mmol/L (ref 135–145)

## 2018-04-15 LAB — GLUCOSE, CAPILLARY: Glucose-Capillary: 85 mg/dL (ref 70–99)

## 2018-04-15 LAB — HIV ANTIBODY (ROUTINE TESTING W REFLEX): HIV Screen 4th Generation wRfx: NONREACTIVE

## 2018-04-15 LAB — STREP PNEUMONIAE URINARY ANTIGEN: Strep Pneumo Urinary Antigen: NEGATIVE

## 2018-04-15 LAB — ABO/RH: ABO/RH(D): A POS

## 2018-04-15 SURGERY — CYSTOSCOPY, WITH RETROGRADE PYELOGRAM AND URETERAL STENT INSERTION
Anesthesia: General | Laterality: Bilateral

## 2018-04-15 MED ORDER — FENTANYL CITRATE (PF) 100 MCG/2ML IJ SOLN
INTRAMUSCULAR | Status: DC | PRN
  Administered 2018-04-15 (×3): 50 ug via INTRAVENOUS

## 2018-04-15 MED ORDER — INFLUENZA VAC SPLIT HIGH-DOSE 0.5 ML IM SUSY
0.5000 mL | PREFILLED_SYRINGE | INTRAMUSCULAR | Status: AC
  Administered 2018-04-16: 0.5 mL via INTRAMUSCULAR
  Filled 2018-04-15: qty 0.5

## 2018-04-15 MED ORDER — ROCURONIUM BROMIDE 50 MG/5ML IV SOSY
PREFILLED_SYRINGE | INTRAVENOUS | Status: DC | PRN
  Administered 2018-04-15: 5 mg via INTRAVENOUS
  Administered 2018-04-15: 25 mg via INTRAVENOUS

## 2018-04-15 MED ORDER — SUGAMMADEX SODIUM 200 MG/2ML IV SOLN
INTRAVENOUS | Status: DC | PRN
  Administered 2018-04-15: 240 mg via INTRAVENOUS

## 2018-04-15 MED ORDER — LORAZEPAM 2 MG/ML IJ SOLN: 0.5000 mg | Freq: Three times a day (TID) | INTRAMUSCULAR | Status: DC | PRN

## 2018-04-15 MED ORDER — FENTANYL CITRATE (PF) 100 MCG/2ML IJ SOLN: 25.0000 ug | INTRAMUSCULAR | Status: DC | PRN

## 2018-04-15 MED ORDER — DEXAMETHASONE SODIUM PHOSPHATE 10 MG/ML IJ SOLN
INTRAMUSCULAR | Status: DC | PRN
  Administered 2018-04-15: 10 mg via INTRAVENOUS

## 2018-04-15 MED ORDER — SODIUM CHLORIDE 0.9 % IV SOLN
INTRAVENOUS | Status: DC | PRN
  Administered 2018-04-15: 10 mL

## 2018-04-15 MED ORDER — LIDOCAINE 2% (20 MG/ML) 5 ML SYRINGE
INTRAMUSCULAR | Status: DC | PRN
  Administered 2018-04-15: 60 mg via INTRAVENOUS

## 2018-04-15 MED ORDER — LACTATED RINGERS IV SOLN
INTRAVENOUS | Status: DC
  Administered 2018-04-15: 15:00:00 via INTRAVENOUS

## 2018-04-15 MED ORDER — ONDANSETRON HCL 4 MG/2ML IJ SOLN
INTRAMUSCULAR | Status: AC
  Filled 2018-04-15: qty 2

## 2018-04-15 MED ORDER — SODIUM CHLORIDE 0.9 % IV SOLN
INTRAVENOUS | Status: DC | PRN
  Administered 2018-04-15: 1000 mL via INTRAVENOUS
  Administered 2018-04-18: 250 mL via INTRAVENOUS

## 2018-04-15 MED ORDER — SODIUM CHLORIDE 0.9 % IV SOLN
INTRAVENOUS | Status: DC | PRN
  Administered 2018-04-15: 17:00:00 via INTRAVENOUS

## 2018-04-15 MED ORDER — SODIUM CHLORIDE 0.9 % IR SOLN
Status: DC | PRN
  Administered 2018-04-15: 3000 mL

## 2018-04-15 MED ORDER — FENTANYL CITRATE (PF) 100 MCG/2ML IJ SOLN
INTRAMUSCULAR | Status: AC
  Filled 2018-04-15: qty 2

## 2018-04-15 MED ORDER — SUCCINYLCHOLINE CHLORIDE 200 MG/10ML IV SOSY
PREFILLED_SYRINGE | INTRAVENOUS | Status: DC | PRN
  Administered 2018-04-15: 80 mg via INTRAVENOUS

## 2018-04-15 MED ORDER — PHENYLEPHRINE 40 MCG/ML (10ML) SYRINGE FOR IV PUSH (FOR BLOOD PRESSURE SUPPORT)
PREFILLED_SYRINGE | INTRAVENOUS | Status: DC | PRN
  Administered 2018-04-15 (×2): 80 ug via INTRAVENOUS

## 2018-04-15 MED ORDER — CEFAZOLIN SODIUM-DEXTROSE 2-4 GM/100ML-% IV SOLN
2.0000 g | INTRAVENOUS | Status: AC
  Administered 2018-04-15: 2 g via INTRAVENOUS
  Filled 2018-04-15 (×2): qty 100

## 2018-04-15 MED ORDER — DEXAMETHASONE SODIUM PHOSPHATE 10 MG/ML IJ SOLN
INTRAMUSCULAR | Status: AC
  Filled 2018-04-15: qty 1

## 2018-04-15 MED ORDER — ONDANSETRON HCL 4 MG/2ML IJ SOLN
INTRAMUSCULAR | Status: DC | PRN
  Administered 2018-04-15: 4 mg via INTRAVENOUS

## 2018-04-15 MED ORDER — ONDANSETRON HCL 4 MG/2ML IJ SOLN: 4.0000 mg | Freq: Once | INTRAMUSCULAR | Status: DC | PRN

## 2018-04-15 MED ORDER — PROPOFOL 10 MG/ML IV BOLUS
INTRAVENOUS | Status: AC
  Filled 2018-04-15: qty 20

## 2018-04-15 MED ORDER — PNEUMOCOCCAL VAC POLYVALENT 25 MCG/0.5ML IJ INJ
0.5000 mL | INJECTION | INTRAMUSCULAR | Status: AC
  Administered 2018-04-16: 0.5 mL via INTRAMUSCULAR
  Filled 2018-04-15: qty 0.5

## 2018-04-15 MED ORDER — PROPOFOL 10 MG/ML IV BOLUS
INTRAVENOUS | Status: DC | PRN
  Administered 2018-04-15: 130 mg via INTRAVENOUS

## 2018-04-15 SURGICAL SUPPLY — 15 items
BAG URO CATCHER STRL LF (MISCELLANEOUS) ×3 IMPLANT
CATH INTERMIT  6FR 70CM (CATHETERS) ×3 IMPLANT
CLOTH BEACON ORANGE TIMEOUT ST (SAFETY) ×3 IMPLANT
COVER WAND RF STERILE (DRAPES) IMPLANT
GLOVE BIOGEL M STRL SZ7.5 (GLOVE) ×3 IMPLANT
GOWN STRL REUS W/TWL LRG LVL3 (GOWN DISPOSABLE) ×6 IMPLANT
GUIDEWIRE ANG ZIPWIRE 038X150 (WIRE) ×3 IMPLANT
GUIDEWIRE STR DUAL SENSOR (WIRE) ×3 IMPLANT
LOOP CUT BIPOLAR 24F LRG (ELECTROSURGICAL) ×3 IMPLANT
MANIFOLD NEPTUNE II (INSTRUMENTS) ×3 IMPLANT
PACK CYSTO (CUSTOM PROCEDURE TRAY) ×3 IMPLANT
STENT URET 6FRX24 CONTOUR (STENTS) ×6 IMPLANT
SYRINGE IRR TOOMEY STRL 70CC (SYRINGE) ×3 IMPLANT
TUBING CONNECTING 10 (TUBING) ×2 IMPLANT
TUBING CONNECTING 10' (TUBING) ×1

## 2018-04-15 NOTE — Anesthesia Procedure Notes (Signed)
Procedure Name: Intubation Date/Time: 04/15/2018 4:16 PM Performed by: West Pugh, CRNA Pre-anesthesia Checklist: Patient identified, Emergency Drugs available, Suction available, Patient being monitored and Timeout performed Patient Re-evaluated:Patient Re-evaluated prior to induction Oxygen Delivery Method: Circle system utilized Preoxygenation: Pre-oxygenation with 100% oxygen Induction Type: IV induction, Rapid sequence and Cricoid Pressure applied Laryngoscope Size: Mac and 4 Grade View: Grade I Tube type: Oral Tube size: 7.5 mm Number of attempts: 1 Airway Equipment and Method: Stylet Placement Confirmation: ETT inserted through vocal cords under direct vision,  positive ETCO2,  CO2 detector and breath sounds checked- equal and bilateral Secured at: 22 cm Tube secured with: Tape Dental Injury: Teeth and Oropharynx as per pre-operative assessment

## 2018-04-15 NOTE — Progress Notes (Signed)
Pt. to surgery via bed, spouse at side. Pt. alert and oriented x 4, no respiratory distress noted.

## 2018-04-15 NOTE — Op Note (Signed)
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  Cystoscopy 2.  Bilateral retrograde pyelography with interpretation 3.  Bilateral ureteral stent placement (6 x 24) 4.  Transurethral resection of bladder tumor (2.5 cm)  Surgeon: Pryor Curia MD  Anesthesia: General  Complications: None  EBL: Minimal  Specimens: 1.  Right lateral bladder tumor 2.  Posterior bladder tumor  Disposition of specimens: Pathology  Intraoperative findings: Bilateral retrograde pyelography was performed with Omnipaque contrast through 5 French ureteral catheters.  On the right side, multiple areas of distal narrowing were noted consistent with developing obstruction with mild dilation of the proximal ureter and renal pelvis and collecting system.  No intraureteral or renal pelvic filling defects were noted.  On the left side, there was more extensive areas of narrowing throughout the distal and mid ureter with some tortuosity.  There was significant proximal dilation of the proximal ureter and renal collecting system.  Indication: Miguel Gamble is a 67 year old gentleman who presented with a large pelvic mass with metastatic disease most likely originating in the sigmoid colon.  He was noted to have evidence of an elevated creatinine along with left hydronephrosis, and multiple possible bladder masses on his CT imaging.  As such, we discussed proceeding with cystoscopy with transurethral resection of these bladder masses for possible tissue diagnosis and to proceed with bilateral ureteral stent placement to optimize his renal function.  The potential risks, complications, and the expected recovery process of this procedure were discussed in detail and informed consent was obtained.  Description of procedure: The  patient was taken to the operating room and a general anesthetic was administered.  He was given preoperative antibiotics, placed in the dorsolithotomy position, and prepped and draped in the usual sterile fashion.  A preoperative timeout was performed.  Cystourethroscopy was then performed with the 30 degree lens and 22 French cystoscope sheath.  There was noted to be a large formed clot within the bladder with evidence of multiple pedunculated tumors extending from the right lateral bladder wall and a group of pedunculated tumors at the posterior bladder wall.  These appeared to be most consistent with a non-papillary, non-urothelial tumor on visual inspection.  Attention then turned to the right ureteral orifice.  Omnipaque contrast was injected through a 5 [place] ureteral stent.  There was noted to be evidence of narrowing of the right ureter distally and multiple areas consistent with developing obstruction.  Therefore, a 0.38 sensor guidewire was advanced up the right ureter into the renal pelvis.  A 6 x 24 double-J ureteral stent was then advanced over the wire using Seldinger technique and positioned appropriately under fluoroscopic and cystoscopic guidance.  The wire was then removed with a good curl noted in the renal pelvis as well as within the bladder.  Attention then turned to the left side and again Omnipaque contrast was injected.  This demonstrated significant areas of narrowing of the ureter consistent with an apple core appearance most likely related to extrinsic obstruction.  An attempt to place a sensor guidewire was unsuccessful in the area where the ureter was most narrowed in the midportion.  I therefore used a 0.38 Glidewire which was able to eventually be manipulated into the proximal ureter in the renal pelvis.  I then advanced the 5 French ureteral catheter and the Glidewire was exchanged for the sensor wire.  A 6 x 24 double-J ureteral stent was then advanced over this wire and  positioned appropriately within the renal pelvis.  The wire was removed with a good curl noted in the renal pelvis as well as within the bladder.  Attention then turned to the bladder.  The cystoscope was removed and replaced with the 26 French resectoscope sheath.  This was placed using a visual obturator.  I then irrigated the bladder with a To[place]y syringe removing the formed clot that was previously noted.  Using a cutting resection loop, I then resected both the right lateral bladder tumor and the posterior bladder tumors and sent them as separate specimens.  Hemostasis was achieved with electrocautery.  The bladder was then emptied and reinspected and hemostasis appeared excellent.  The bladder was then emptied and the procedure was ended.  The patient tolerated the procedure well and without complications.  He was able to be extubated and transferred to the recovery unit in satisfactory condition.

## 2018-04-15 NOTE — Progress Notes (Signed)
Pt. returned from Surgery via bed, alert and oriented x 4. Spouse at bedside,  No respiratory distress noted.

## 2018-04-15 NOTE — Progress Notes (Signed)
Patient ID: Miguel Gamble, male   DOB: Feb 08, 1951, 67 y.o.   MRN: [id number]  PROGRESS NOTE    Miguel Gamble  YQM:[id number] DOB: 13-Sep-1950 DOA: 04/14/2018 PCP: Wenda Low, MD   Brief Narrative:  67 year old male with no past medical history presented with weight loss and loss of appetite.  He was found to be tachycardic with creatinine of 2.83 and leukocytosis.  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  General surgery was consulted.   Assessment & Plan:   Principal Problem:   Partial bowel obstruction (HCC) Active Problems:   Renal insufficiency   Normocytic anemia   Liver masses   Mass of urinary bladder   Pulmonary nodules   Colonic mass   Entero-colonic fistula   Entero-enteric fistula   CAP (community acquired pneumonia)  Intra-abdominal masses with partial small bowel obstruction with probable fistula -General surgery following.  General surgery is consulted urology and GI. -Patient might need diverting ileostomy/colostomy -Consulted oncology/Dr. Learta Codding as per general surgery's request. -N.p.o. for now.  IV fluids.  Pain management -Palliative care consult  Pelvic mass with metastases with hydronephrosis and acute renal failure -No prior labs available.  Creatinine improving, today is 2.24.  Presented with creatinine of 2.83.  Most likely obstructive in nature -Continue IV fluids. -Urology has been consulted.  Patient might need cystoscopy and stenting.  Leukocytosis -Probably from above.  Improving.  Monitor  Probable community-acquired pneumonia -Lung nodules noted on CT with pneumonia versus lymphangitic spread of tumor -Follow cultures.  Continue Rocephin and Zithromax.  Strep pneumoniae urinary antigen is negative  DVT  prophylaxis: SCDs Code Status: Full Family Communication: Wife at bedside Disposition Plan: Depends on clinical outcome  Consultants: General surgery/GI/urology/oncology  Procedures: None  Antimicrobials: Rocephin and Zithromax from 04/14/2018 onwards  Subjective: Patient seen and examined at bedside.  Denies worsening abdominal pain or vomiting.  Had bowel movement yesterday.  No overnight fever.  Objective: Vitals:   04/14/18 2300 04/15/18 0000 04/15/18 0038 04/15/18 0523  BP: 128/86  115/71 117/66  Pulse: 96  95 (!) 101  Resp: 18 18 17 19   Temp:   98 F (36.7 C) 98 F (36.7 C)  TempSrc:   Oral Oral  SpO2: 97%  99% 93%  Weight:   59.6 kg 59.7 kg  Height:   5\' 8"  (1.727 m)     Intake/Output Summary (Last 24 hours) at 04/15/2018 1253 Last data filed at 04/15/2018 1230 Gross per 24 hour  Intake 2067.55 ml  Output 600 ml  Net 1467.55 ml   Filed Weights   04/14/18 1711 04/15/18 0038 04/15/18 0523  Weight: 57.2 kg 59.6 kg 59.7 kg    Examination:  General exam: Appears calm and comfortable.  Very thinly built; looks older than stated age Respiratory system: Bilateral decreased breath sounds at bases, scattered crackles Cardiovascular system: S1 & S2 heard, intermittent tachycardia Gastrointestinal system: Abdomen is distended, soft and nontender.  Bowel sounds sluggish  extremities: No cyanosis, clubbing, edema  .     Data Reviewed: I have personally reviewed following labs and imaging studies  CBC: Recent Labs  Lab 04/14/18 1801 04/15/18 0603  WBC 20.3* 16.1*  NEUTROABS  --  14.6*  HGB 8.9* 7.5*  HCT 29.3* 23.7*  MCV 96.1 90.5  PLT 315 528   Basic Metabolic Panel: Recent Labs  Lab 04/14/18  1801 04/15/18 0603  NA 144 145  K 4.0 3.2*  CL 111 115*  CO2 18* 17*  GLUCOSE 125* 99  BUN 75* 76*  CREATININE 2.83* 2.24*  CALCIUM 9.0 8.3*   GFR: Estimated Creatinine Clearance: 26.7 mL/min (A) (by C-G formula based on SCr of 2.24 mg/dL (H)). Liver  Function Tests: Recent Labs  Lab 04/14/18 1801  AST 47*  ALT 26  ALKPHOS 83  BILITOT 1.0  PROT 7.3  ALBUMIN 3.4*   Recent Labs  Lab 04/14/18 1801  LIPASE 23   No results for input(s): AMMONIA in the last 168 hours. Coagulation Profile: No results for input(s): INR, PROTIME in the last 168 hours. Cardiac Enzymes: No results for input(s): CKTOTAL, CKMB, CKMBINDEX, TROPONINI in the last 168 hours. BNP (last 3 results) No results for input(s): PROBNP in the last 8760 hours. HbA1C: No results for input(s): HGBA1C in the last 72 hours. CBG: Recent Labs  Lab 04/15/18 0952  GLUCAP 85   Lipid Profile: No results for input(s): CHOL, HDL, LDLCALC, TRIG, CHOLHDL, LDLDIRECT in the last 72 hours. Thyroid Function Tests: No results for input(s): TSH, T4TOTAL, FREET4, T3FREE, THYROIDAB in the last 72 hours. Anemia Panel: No results for input(s): VITAMINB12, FOLATE, FERRITIN, TIBC, IRON, RETICCTPCT in the last 72 hours. Sepsis Labs: Recent Labs  Lab 04/14/18 1808 04/14/18 2009  LATICACIDVEN 3.65* 1.86    No results found for this or any previous visit (from the past 240 hour(s)).       Radiology Studies: Ct Abdomen Pelvis Wo Contrast  Result Date: 04/14/2018 CLINICAL DATA:  Chronic weight loss. Abnormal radiograph. Further evaluation requested. EXAM: CT CHEST, ABDOMEN AND PELVIS WITHOUT CONTRAST TECHNIQUE: Multidetector CT imaging of the chest, abdomen and pelvis was performed following the standard protocol without IV contrast. COMPARISON:  None. FINDINGS: CT CHEST FINDINGS Cardiovascular: The heart is normal in size. Mild calcification is noted at the aortic arch. The great vessels are unremarkable in appearance. Mediastinum/Nodes: The esophagus is filled with contrast, which may reflect gastroesophageal reflux or esophageal dysmotility. Wall thickening along the esophagus may reflect esophagitis or possibly metastatic disease. Mild achalasia cannot be excluded. Mild coronary  artery calcification is seen. An enlarged 1.8 cm precarinal node is suspicious for metastatic disease. A trace pericardial effusion is noted. The visualized portions of the thyroid gland are unremarkable. No axillary lymphadenopathy is seen. Lungs/Pleura: Patchy bilateral airspace opacities are noted, predominantly at the right upper lobe and left lower lobe, concerning for pneumonia. Lymphangitic spread of tumor could conceivably have a similar appearance. Multiple large pulmonary nodules are noted bilaterally, measuring up to 1.7 cm in size, compatible with metastatic disease. No pleural effusion or pneumothorax is seen. Musculoskeletal: No acute osseous abnormalities are identified. The visualized musculature is unremarkable in appearance. CT ABDOMEN PELVIS FINDINGS Hepatobiliary: Vague lesions within the liver measure up to 3.5 cm in size, compatible with metastatic disease. The gallbladder is not definitely characterized. The common bile duct is normal in caliber. Pancreas: The pancreas is within normal limits. Peripancreatic nodes measure up to 1.4 cm in short axis, concerning for metastatic disease. Spleen: The spleen is diminutive and unremarkable in appearance. Adrenals/Urinary Tract: The adrenal glands are grossly unremarkable in appearance. Scattered bilateral renal cysts are noted, more prominent on the right. There is mild chronic left-sided hydronephrosis, with prominence of the proximal left ureter to the level of the lower abdomen, reflecting obstruction by the large lower abdominal and pelvic mass. No renal or ureteral stones are identified. Mild left-sided  perinephric stranding is noted. Stomach/Bowel: There is diffuse dilatation of small-bowel loops to 6.0 cm in diameter, and distention of the cecum to 12.0 cm in diameter. The small and large bowel are diffusely dilated. The stomach is largely filled with contrast. Distention extends to the level of the proximal sigmoid colon. A very large complex  mass is noted at the mid to lower abdomen and pelvis, which appears to include a fistula between multiple loops of small bowel, the cecum and sigmoid colon, with stool in the upper aspect of the mass. Wall thickening is noted along the adjacent small and large bowel loops, and the mass measures approximately 16 x 9 cm. Wall thickening extends to the rectum, with diffuse presacral stranding. This most likely arises from the sigmoid colon, though a small bowel or bladder source is also possible. Vascular/Lymphatic: Scattered calcification is seen along the abdominal aorta and its branches. The abdominal aorta is otherwise grossly unremarkable. The inferior vena cava is grossly unremarkable. Scattered enlarged mesenteric and retroperitoneal nodes are seen, measuring up to 1.5 cm in short axis. Vague mass tracks directly anterior to the aortic bifurcation and along the pelvic sidewall bilaterally. A 1.9 cm node is seen along the left external iliac vessels. Reproductive: Multiple masses are noted within the bladder, with diffuse irregular bladder wall thickening at the dome of the bladder, reflecting diffuse spread of disease. The prostate is borderline enlarged, measuring 4.9 cm in transverse dimension. Other: Trace fluid is noted at a small right inguinal hernia. Musculoskeletal: No acute osseous abnormalities are identified. The visualized musculature is unremarkable in appearance. IMPRESSION: 1. 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 The mass measures 16 x 9 cm. Wall thickening extends along the adjacent small and large bowel loops. 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 2. Marked diffuse dilatation of the small and large bowel loops, reflecting partial bowel  obstruction by the mass. 3. Extensive retroperitoneal, mesenteric and pelvic sidewall metastatic lymphadenopathy noted. Enlarged mediastinal node also noted, concerning for metastatic disease. 4. Multiple pulmonary nodules noted bilaterally, compatible with metastatic disease. 5. Wall thickening along the esophagus may reflect esophagitis or possibly metastatic disease. Esophagus filled with contrast, possibly reflecting gastroesophageal reflux or esophageal dysmotility. Mild achalasia cannot be excluded. 6. Mild chronic left-sided hydronephrosis, reflecting obstruction by the large lower abdominal and pelvic mass. 7. Multiple masses within the bladder, with diffuse irregular bladder wall thickening at the dome of the bladder, reflecting diffuse spread of malignancy. 8. Scattered hepatic metastases noted. 9. Patchy bilateral airspace opacities, predominantly at the right upper lobe and left lower lobe, concerning for pneumonia. Lymphangitic spread of tumor could conceivably have a similar appearance. 10. Bilateral renal cysts. 11. Trace pericardial effusion noted. Aortic Atherosclerosis (ICD10-I70.0). These results were called by telephone at the time of interpretation on 04/14/2018 at 9:41 pm to [facility] PA, who verbally acknowledged these results. Electronically Signed   By: Garald Balding M.D.   On: 04/14/2018 21:48   Ct Chest Wo Contrast  Result Date: 04/14/2018 CLINICAL DATA:  Chronic weight loss. Abnormal radiograph. Further evaluation requested. EXAM: CT CHEST, ABDOMEN AND PELVIS WITHOUT CONTRAST TECHNIQUE: Multidetector CT imaging of the chest, abdomen and pelvis was performed following the standard protocol without IV contrast. COMPARISON:  None.  FINDINGS: CT CHEST FINDINGS Cardiovascular: The heart is normal in size. Mild calcification is noted at the aortic arch. The great vessels are unremarkable in appearance. Mediastinum/Nodes: The esophagus is filled with contrast, which may reflect  gastroesophageal reflux or esophageal dysmotility. Wall thickening along the esophagus may reflect esophagitis or possibly metastatic disease. Mild achalasia cannot be excluded. Mild coronary artery calcification is seen. An enlarged 1.8 cm precarinal node is suspicious for metastatic disease. A trace pericardial effusion is noted. The visualized portions of the thyroid gland are unremarkable. No axillary lymphadenopathy is seen. Lungs/Pleura: Patchy bilateral airspace opacities are noted, predominantly at the right upper lobe and left lower lobe, concerning for pneumonia. Lymphangitic spread of tumor could conceivably have a similar appearance. Multiple large pulmonary nodules are noted bilaterally, measuring up to 1.7 cm in size, compatible with metastatic disease. No pleural effusion or pneumothorax is seen. Musculoskeletal: No acute osseous abnormalities are identified. The visualized musculature is unremarkable in appearance. CT ABDOMEN PELVIS FINDINGS Hepatobiliary: Vague lesions within the liver measure up to 3.5 cm in size, compatible with metastatic disease. The gallbladder is not definitely characterized. The common bile duct is normal in caliber. Pancreas: The pancreas is within normal limits. Peripancreatic nodes measure up to 1.4 cm in short axis, concerning for metastatic disease. Spleen: The spleen is diminutive and unremarkable in appearance. Adrenals/Urinary Tract: The adrenal glands are grossly unremarkable in appearance. Scattered bilateral renal cysts are noted, more prominent on the right. There is mild chronic left-sided hydronephrosis, with prominence of the proximal left ureter to the level of the lower abdomen, reflecting obstruction by the large lower abdominal and pelvic mass. No renal or ureteral stones are identified. Mild left-sided perinephric stranding is noted. Stomach/Bowel: There is diffuse dilatation of small-bowel loops to 6.0 cm in diameter, and distention of the cecum to 12.0  cm in diameter. The small and large bowel are diffusely dilated. The stomach is largely filled with contrast. Distention extends to the level of the proximal sigmoid colon. 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 Wall thickening is noted along the adjacent small and large bowel loops, and the mass measures approximately 16 x 9 cm. Wall thickening extends to the rectum, with diffuse presacral stranding. This most likely arises from the sigmoid colon, though a small bowel or bladder source is also possible. Vascular/Lymphatic: Scattered calcification is seen along the abdominal aorta and its branches. The abdominal aorta is otherwise grossly unremarkable. The inferior vena cava is grossly unremarkable. Scattered enlarged mesenteric and retroperitoneal nodes are seen, measuring up to 1.5 cm in short axis. Vague mass tracks directly anterior to the aortic bifurcation and along the pelvic sidewall bilaterally. A 1.9 cm node is seen along the left external iliac vessels. Reproductive: Multiple masses are noted within the bladder, with diffuse irregular bladder wall thickening at the dome of the bladder, reflecting diffuse spread of disease. The prostate is borderline enlarged, measuring 4.9 cm in transverse dimension. Other: Trace fluid is noted at a small right inguinal hernia. Musculoskeletal: No acute osseous abnormalities are identified. The visualized musculature is unremarkable in appearance. IMPRESSION: 1. Very large complex mass at the mid to lower abdomen and pelvis, which appears to include a fistula between multiple loops of small bowel, the cecum and sigmoid colon, with stool in the upper aspect  of the mass. The mass measures 16 x 9 cm. Wall thickening extends along the adjacent small and large bowel loops. This most likely  reflects a sigmoid colonic primary malignancy, though a small bowel or bladder source is also possible. 2. Marked diffuse dilatation of the small and large bowel loops, reflecting partial bowel obstruction by the mass. 3. Extensive retroperitoneal, mesenteric and pelvic sidewall metastatic lymphadenopathy noted. Enlarged mediastinal node also noted, concerning for metastatic disease. 4. Multiple pulmonary nodules noted bilaterally, compatible with metastatic disease. 5. Wall thickening along the esophagus may reflect esophagitis or possibly metastatic disease. Esophagus filled with contrast, possibly reflecting gastroesophageal reflux or esophageal dysmotility. Mild achalasia cannot be excluded. 6. Mild chronic left-sided hydronephrosis, reflecting obstruction by the large lower abdominal and pelvic mass. 7. Multiple masses within the bladder, with diffuse irregular bladder wall thickening at the dome of the bladder, reflecting diffuse spread of malignancy. 8. Scattered hepatic metastases noted. 9. Patchy bilateral airspace opacities, predominantly at the right upper lobe and left lower lobe, concerning for pneumonia. Lymphangitic spread of tumor could conceivably have a similar appearance. 10. Bilateral renal cysts. 11. Trace pericardial effusion noted. Aortic Atherosclerosis (ICD10-I70.0). These results were called by telephone at the time of interpretation on 04/14/2018 at 9:41 pm to [facility] PA, who verbally acknowledged these results. Electronically Signed   By: Garald Balding M.D.   On: 04/14/2018 21:48        Scheduled Meds: . [START ON 04/16/2018] Influenza vac split quadrivalent PF  0.5 mL Intramuscular Tomorrow-1000  . [START ON 04/16/2018] pneumococcal 23 valent vaccine  0.5 mL Intramuscular Tomorrow-1000   Continuous Infusions: . azithromycin Stopped (04/15/18 0022)  .  ceFAZolin (ANCEF) IV    . cefTRIAXone (ROCEPHIN)  IV Stopped (04/14/18 2314)     LOS: 0 days        Aline August, MD [facility] Pager [phone]  If 7PM-7AM, please contact night-coverage [email or web address] Password [facility] 04/15/2018, 12:53 PM

## 2018-04-15 NOTE — Transfer of Care (Signed)
Immediate Anesthesia Transfer of Care Note  Patient: Miguel Gamble  Procedure(s) Performed: CYSTOSCOPY BILATERAL WITH RETROGRADE PYELOGRAM/URETERAL BILATERAL STENT PLACEMENT WITH TRANSURETHRAL RESECTION OF BLADDER TUMOR (Bilateral )  Patient Location: PACU  Anesthesia Type:General  Level of Consciousness: awake, alert , oriented and patient cooperative  Airway & Oxygen Therapy: Patient Spontanous Breathing and Patient connected to face mask oxygen  Post-op Assessment: Report given to RN and Post -op Vital signs reviewed and stable  Post vital signs: Reviewed and stable  Last Vitals:  Vitals Value Taken Time  BP 137/88 04/15/2018  5:19 PM  Temp 37.1 C 04/15/2018  5:19 PM  Pulse 103 04/15/2018  5:29 PM  Resp 24 04/15/2018  5:29 PM  SpO2 93 % 04/15/2018  5:29 PM  Vitals shown include unvalidated device data.  Last Pain:  Vitals:   04/15/18 1719  TempSrc:   PainSc: 0-No pain         Complications: No apparent anesthesia complications

## 2018-04-15 NOTE — Consult Note (Addendum)
[facility] Consult  Referring Provider: Stark Klein, MD/Surgery and Aline August, MD/[facility] Primary Care Physician:  Wenda Low, MD Primary Gastroenterologist: Althia Forts  Reason for Consultation:  Sigmoid mass, evaluation for colonic stenting  HPI: Miguel Gamble is a 67 y.o. male who was admitted yesterday with profound weight loss of 40 pounds in the last several months. In summer patient had diarrhea and dyspepsia which she contributed to sensitivity/allergy to garlic. This contributed to about 18 pound weight loss, however, patient continued to have loss of appetite and progressive weakness. He had noted some change in bowel habits, with the diarrhea, but denied blood in stool or black stools until today morning when he noticed some blood in stool. Patient denies prior colonoscopy. Denies history of difficulty swallowing pain on swallowing, GERD, acid reflux or early satiety. He does not have a primary care physician but is scheduled to see Dr. Deforest Hoyles for first office visit in January.  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  History reviewed. No pertinent past medical history.  History reviewed. No pertinent surgical history.  Prior to Admission medications   Medication Sig Start Date End Date Taking? Authorizing Provider  aspirin 325 MG EC tablet Take 325 mg by mouth every 6 (six) hours as needed for pain.   Yes [provider]    Current Facility-Administered Medications  Medication Dose Route Frequency Provider Last Rate Last Dose  . acetaminophen (TYLENOL) tablet 650 mg  650 mg Oral Q6H PRN Opyd,  Ilene Qua, MD       Or  . acetaminophen (TYLENOL) suppository 650 mg  650 mg Rectal Q6H PRN Opyd, Ilene Qua, MD      . azithromycin (ZITHROMAX) 500 mg in sodium chloride 0.9 % 250 mL IVPB  500 mg Intravenous QHS Opyd, Ilene Qua, MD   Stopped at 04/15/18 0022  . ceFAZolin (ANCEF) IVPB 2g/100 mL premix  2 g Intravenous On Call to OR Raynelle Bring, MD      . cefTRIAXone (ROCEPHIN) 1 g in sodium chloride 0.9 % 100 mL IVPB  1 g Intravenous QHS Opyd, Ilene Qua, MD   Stopped at 04/14/18 2314  . [START ON 04/16/2018] Influenza vac split quadrivalent PF (FLUZONE HIGH-DOSE) injection 0.5 mL  0.5 mL Intramuscular Tomorrow-1000 Opyd, Ilene Qua, MD      . LORazepam (ATIVAN) injection 0.5-1 mg  0.5-1 mg Intravenous Q8H PRN Opyd, Ilene Qua, MD      . morphine 2 MG/ML injection 1-3 mg  1-3 mg Intravenous Q4H PRN Opyd, Ilene Qua, MD      . ondansetron (ZOFRAN) tablet 4 mg  4 mg Oral Q6H PRN Opyd, Ilene Qua, MD       Or  . ondansetron (ZOFRAN) injection 4 mg  4 mg Intravenous Q6H PRN Opyd, Ilene Qua, MD      . Derrill Memo ON 04/16/2018] pneumococcal 23 valent vaccine (PNU-IMMUNE) injection 0.5 mL  0.5 mL Intramuscular Tomorrow-1000 Opyd, Ilene Qua, MD        Allergies as of 04/14/2018 - Review Complete 04/14/2018  Allergen Reaction Noted  . Other Rash 04/14/2018    Family History  Problem Relation Age of Onset  . Lung cancer Mother   . Emphysema Father   . Heart  failure Father     Social History   Socioeconomic History  . Marital status: Married    Spouse name: Not on file  . Number of children: Not on file  . Years of education: Not on file  . Highest education level: Not on file  Occupational History  . Not on file  Social Needs  . Financial resource strain: Not on file  . Food insecurity:    Worry: Not on file    Inability: Not on file  . Transportation needs:    Medical: Not on file    Non-medical: Not on file  Tobacco Use  . Smoking status: Never Smoker  . Smokeless tobacco: Never Used   Substance and Sexual Activity  . Alcohol use: Not on file  . Drug use: Not on file  . Sexual activity: Not on file  Lifestyle  . Physical activity:    Days per week: Not on file    Minutes per session: Not on file  . Stress: Not on file  Relationships  . Social connections:    Talks on phone: Not on file    Gets together: Not on file    Attends religious service: Not on file    Active member of club or organization: Not on file    Attends meetings of clubs or organizations: Not on file    Relationship status: Not on file  . Intimate partner violence:    Fear of current or ex partner: Not on file    Emotionally abused: Not on file    Physically abused: Not on file    Forced sexual activity: Not on file  Other Topics Concern  . Not on file  Social History Narrative  . Not on file    Review of Systems: Positive for: GI: Described in detail in HPI.    Gen: anorexia, fatigue, weakness, malaise, involuntary weight loss, Denies any fever, chills, rigors, night sweats, and sleep disorder CV: Denies chest pain, angina, palpitations, syncope, orthopnea, PND, peripheral edema, and claudication. Resp: Denies dyspnea, cough, sputum, wheezing, coughing up blood. GU : Denies urinary burning, blood in urine, urinary frequency, urinary hesitancy, nocturnal urination, and urinary incontinence. MS: Denies joint pain or swelling.  Denies muscle weakness, cramps, atrophy.  Derm: Denies rash, itching, oral ulcerations, hives, unhealing ulcers.  Psych: Denies depression, anxiety, memory loss, suicidal ideation, hallucinations,  and confusion. Heme: Denies bruising, bleeding, and enlarged lymph nodes. Neuro:  Denies any headaches, dizziness, paresthesias. Endo:  Denies any problems with DM, thyroid, adrenal function.  Physical Exam: Vital signs in last 24 hours: Temp:  [97.8 F (36.6 C)-98 F (36.7 C)] 98 F (36.7 C) (12/31 0523) Pulse Rate:  [95-109] 101 (12/31 0523) Resp:  [16-20] 19  (12/31 0523) BP: (115-147)/(66-87) 117/66 (12/31 0523) SpO2:  [93 %-100 %] 93 % (12/31 0523) Weight:  [57.2 kg-59.7 kg] 59.7 kg (12/31 0523) Last BM Date: 04/14/18  General:   Cachectic, not in distress , appears older than stated age  head:  Normocephalic and atraumatic. Eyes:  Sclera clear, no icterus.  Prominent pallor Ears:  Normal auditory acuity. Nose:  No deformity, discharge,  or lesions. Mouth:  No deformity or lesions.  Oropharynx pink & moist.  Neck:  Supple; no masses or thyromegaly. Lungs:  Clear throughout to auscultation.   No wheezes, crackles, or rhonchi. No acute distress. Heart:  Regular rate and rhythm; no murmurs, clicks, rubs,  or gallops. Extremities:  Without clubbing or edema. Neurologic:  Alert and  oriented x4;  grossly normal neurologically. Skin:  Intact without significant lesions or rashes. Psych:  Alert and cooperative. Normal mood and affect. Abdomen: Distended, tympanitic on percussion,  no masses, hepatosplenomegaly or hernias noted.  Sluggish bowel sounds, without guarding, and without rebound.         Lab Results: Recent Labs    04/14/18 1801 04/15/18 0603  WBC 20.3* 16.1*  HGB 8.9* 7.5*  HCT 29.3* 23.7*  PLT 315 311   BMET Recent Labs    04/14/18 1801 04/15/18 0603  NA 144 145  K 4.0 3.2*  CL 111 115*  CO2 18* 17*  GLUCOSE 125* 99  BUN 75* 76*  CREATININE 2.83* 2.24*  CALCIUM 9.0 8.3*   LFT Recent Labs    04/14/18 1801  PROT 7.3  ALBUMIN 3.4*  AST 47*  ALT 26  ALKPHOS 83  BILITOT 1.0   PT/INR No results for input(s): LABPROT, INR in the last 72 hours.  Studies/Results: Ct Abdomen Pelvis Wo Contrast  Result Date: 04/14/2018 CLINICAL DATA:  Chronic weight loss. Abnormal radiograph. Further evaluation requested. EXAM: CT CHEST, ABDOMEN AND PELVIS WITHOUT CONTRAST TECHNIQUE: Multidetector CT imaging of the chest, abdomen and pelvis was performed following the standard protocol without IV contrast. COMPARISON:  None.  FINDINGS: CT CHEST FINDINGS Cardiovascular: The heart is normal in size. Mild calcification is noted at the aortic arch. The great vessels are unremarkable in appearance. Mediastinum/Nodes: The esophagus is filled with contrast, which may reflect gastroesophageal reflux or esophageal dysmotility. Wall thickening along the esophagus may reflect esophagitis or possibly metastatic disease. Mild achalasia cannot be excluded. Mild coronary artery calcification is seen. An enlarged 1.8 cm precarinal node is suspicious for metastatic disease. A trace pericardial effusion is noted. The visualized portions of the thyroid gland are unremarkable. No axillary lymphadenopathy is seen. Lungs/Pleura: Patchy bilateral airspace opacities are noted, predominantly at the right upper lobe and left lower lobe, concerning for pneumonia. Lymphangitic spread of tumor could conceivably have a similar appearance. Multiple large pulmonary nodules are noted bilaterally, measuring up to 1.7 cm in size, compatible with metastatic disease. No pleural effusion or pneumothorax is seen. Musculoskeletal: No acute osseous abnormalities are identified. The visualized musculature is unremarkable in appearance. CT ABDOMEN PELVIS FINDINGS Hepatobiliary: Vague lesions within the liver measure up to 3.5 cm in size, compatible with metastatic disease. The gallbladder is not definitely characterized. The common bile duct is normal in caliber. Pancreas: The pancreas is within normal limits. Peripancreatic nodes measure up to 1.4 cm in short axis, concerning for metastatic disease. Spleen: The spleen is diminutive and unremarkable in appearance. Adrenals/Urinary Tract: The adrenal glands are grossly unremarkable in appearance. Scattered bilateral renal cysts are noted, more prominent on the right. There is mild chronic left-sided hydronephrosis, with prominence of the proximal left ureter to the level of the lower abdomen, reflecting obstruction by the large  lower abdominal and pelvic mass. No renal or ureteral stones are identified. Mild left-sided perinephric stranding is noted. Stomach/Bowel: There is diffuse dilatation of small-bowel loops to 6.0 cm in diameter, and distention of the cecum to 12.0 cm in diameter. The small and large bowel are diffusely dilated. The stomach is largely filled with contrast. Distention extends to the level of the proximal sigmoid colon. 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 Wall thickening is noted along the adjacent small and large bowel loops, and the mass measures approximately 16 x 9 cm. Wall thickening extends to the rectum, with diffuse presacral stranding. This most likely arises from the sigmoid colon, though a small bowel or bladder source is also possible. Vascular/Lymphatic: Scattered calcification is seen along the abdominal aorta and its branches. The abdominal aorta is otherwise grossly unremarkable. The inferior vena cava is grossly unremarkable. Scattered enlarged mesenteric and retroperitoneal nodes are seen, measuring up to 1.5 cm in short axis. Vague mass tracks directly anterior to the aortic bifurcation and along the pelvic sidewall bilaterally. A 1.9 cm node is seen along the left external iliac vessels. Reproductive: Multiple masses are noted within the bladder, with diffuse irregular bladder wall thickening at the dome of the bladder, reflecting diffuse spread of disease. The prostate is borderline enlarged, measuring 4.9 cm in transverse dimension. Other: Trace fluid is noted at a small right inguinal hernia. Musculoskeletal: No acute osseous abnormalities are identified. The visualized musculature is unremarkable in appearance. IMPRESSION: 1. 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 The mass measures 16 x 9 cm. Wall thickening extends along the adjacent small and large bowel loops. 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 2. Marked diffuse dilatation of the small and large bowel loops, reflecting partial bowel obstruction by the mass. 3. Extensive retroperitoneal, mesenteric and pelvic sidewall metastatic lymphadenopathy noted. Enlarged mediastinal node also noted, concerning for metastatic disease. 4. Multiple pulmonary nodules noted bilaterally, compatible with metastatic disease. 5. Wall thickening along the esophagus may reflect esophagitis or possibly metastatic disease. Esophagus filled with contrast, possibly reflecting gastroesophageal reflux or esophageal dysmotility. Mild achalasia cannot be excluded. 6. Mild chronic left-sided hydronephrosis, reflecting obstruction by the large lower abdominal and pelvic mass. 7. Multiple masses within the bladder, with diffuse irregular bladder wall thickening at the dome of the bladder, reflecting diffuse spread of malignancy. 8. Scattered hepatic metastases noted. 9. Patchy bilateral airspace opacities, predominantly at the right upper lobe and left lower lobe, concerning for pneumonia. Lymphangitic spread of tumor could conceivably have a similar appearance. 10. Bilateral renal cysts. 11. Trace pericardial effusion noted. Aortic Atherosclerosis (ICD10-I70.0). These results were called by telephone at the time of interpretation on 04/14/2018 at 9:41 pm to [facility] PA, who verbally acknowledged these results. Electronically Signed   By: Garald Balding M.D.   On: 04/14/2018 21:48   Ct Chest Wo Contrast  Result Date: 04/14/2018 CLINICAL DATA:  Chronic weight loss. Abnormal radiograph. Further evaluation requested. EXAM: CT CHEST, ABDOMEN  AND PELVIS WITHOUT CONTRAST TECHNIQUE: Multidetector CT imaging of the chest, abdomen and pelvis was performed following the standard protocol without IV contrast. COMPARISON:  None. FINDINGS: CT CHEST FINDINGS Cardiovascular: The heart is normal in size. Mild calcification is noted at the aortic arch. The great vessels are unremarkable in appearance. Mediastinum/Nodes: The esophagus is filled with contrast, which may reflect gastroesophageal reflux or esophageal dysmotility. Wall thickening along the esophagus may reflect esophagitis or possibly metastatic disease. Mild achalasia cannot be excluded. Mild coronary artery calcification is seen. An enlarged 1.8 cm precarinal node is suspicious for metastatic disease. A trace pericardial effusion is noted. The visualized portions of the thyroid gland are unremarkable. No axillary  lymphadenopathy is seen. Lungs/Pleura: Patchy bilateral airspace opacities are noted, predominantly at the right upper lobe and left lower lobe, concerning for pneumonia. Lymphangitic spread of tumor could conceivably have a similar appearance. Multiple large pulmonary nodules are noted bilaterally, measuring up to 1.7 cm in size, compatible with metastatic disease. No pleural effusion or pneumothorax is seen. Musculoskeletal: No acute osseous abnormalities are identified. The visualized musculature is unremarkable in appearance. CT ABDOMEN PELVIS FINDINGS Hepatobiliary: Vague lesions within the liver measure up to 3.5 cm in size, compatible with metastatic disease. The gallbladder is not definitely characterized. The common bile duct is normal in caliber. Pancreas: The pancreas is within normal limits. Peripancreatic nodes measure up to 1.4 cm in short axis, concerning for metastatic disease. Spleen: The spleen is diminutive and unremarkable in appearance. Adrenals/Urinary Tract: The adrenal glands are grossly unremarkable in appearance. Scattered bilateral renal cysts are noted, more  prominent on the right. There is mild chronic left-sided hydronephrosis, with prominence of the proximal left ureter to the level of the lower abdomen, reflecting obstruction by the large lower abdominal and pelvic mass. No renal or ureteral stones are identified. Mild left-sided perinephric stranding is noted. Stomach/Bowel: There is diffuse dilatation of small-bowel loops to 6.0 cm in diameter, and distention of the cecum to 12.0 cm in diameter. The small and large bowel are diffusely dilated. The stomach is largely filled with contrast. Distention extends to the level of the proximal sigmoid colon. 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 Wall thickening is noted along the adjacent small and large bowel loops, and the mass measures approximately 16 x 9 cm. Wall thickening extends to the rectum, with diffuse presacral stranding. This most likely arises from the sigmoid colon, though a small bowel or bladder source is also possible. Vascular/Lymphatic: Scattered calcification is seen along the abdominal aorta and its branches. The abdominal aorta is otherwise grossly unremarkable. The inferior vena cava is grossly unremarkable. Scattered enlarged mesenteric and retroperitoneal nodes are seen, measuring up to 1.5 cm in short axis. Vague mass tracks directly anterior to the aortic bifurcation and along the pelvic sidewall bilaterally. A 1.9 cm node is seen along the left external iliac vessels. Reproductive: Multiple masses are noted within the bladder, with diffuse irregular bladder wall thickening at the dome of the bladder, reflecting diffuse spread of disease. The prostate is borderline enlarged, measuring 4.9 cm in transverse dimension. Other: Trace fluid is noted at a small right inguinal hernia. Musculoskeletal: No acute osseous  abnormalities are identified. The visualized musculature is unremarkable in appearance. IMPRESSION: 1. 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 The mass measures 16 x 9 cm. Wall thickening extends along the adjacent small and large bowel loops. 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 2. Marked diffuse dilatation of the small and large bowel loops, reflecting partial bowel obstruction by the mass. 3. Extensive retroperitoneal, mesenteric and pelvic sidewall metastatic lymphadenopathy noted. Enlarged mediastinal node also noted, concerning for metastatic disease. 4. Multiple pulmonary nodules noted bilaterally, compatible with metastatic disease. 5. Wall thickening along the  esophagus may reflect esophagitis or possibly metastatic disease. Esophagus filled with contrast, possibly reflecting gastroesophageal reflux or esophageal dysmotility. Mild achalasia cannot be excluded. 6. Mild chronic left-sided hydronephrosis, reflecting obstruction by the large lower abdominal and pelvic mass. 7. Multiple masses within the bladder, with diffuse irregular bladder wall thickening at the dome of the bladder, reflecting diffuse spread of malignancy. 8. Scattered hepatic metastases noted. 9. Patchy bilateral airspace opacities, predominantly at the right upper lobe and left lower lobe, concerning for pneumonia. Lymphangitic spread of tumor could conceivably have a similar appearance. 10. Bilateral renal cysts. 11. Trace pericardial effusion noted. Aortic Atherosclerosis (ICD10-I70.0). These results were called by telephone at the time of interpretation on 04/14/2018 at 9:41 pm to [facility] PA, who verbally acknowledged these results. Electronically Signed   By: Garald Balding  M.D.   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 Patient will benefit from a diverting ileostomy/colostomy. Discussed the same with Dr. Barry Dienes. The cecum and small bowel appear very distended, recommend NG tube placement and to keep it to low intermittent suction. Meanwhile, patient is scheduled for cystoscopy with bilateral ureteric stent placement today.    LOS: 0 days   Ronnette Juniper, MD 04/15/2018, 12:29 PM  Pager [phone] If no answer or after 5 PM call [phone]

## 2018-04-15 NOTE — Anesthesia Preprocedure Evaluation (Addendum)
Anesthesia Evaluation  Patient identified by MRN, date of birth, ID band Patient awake    Reviewed: Allergy & Precautions, NPO status , Patient's Chart, lab work & pertinent test results  Airway Mallampati: II  TM Distance: >3 FB Neck ROM: Full    Dental  (+) Dental Advisory Given, Implants Full set of permanent implants :   Pulmonary pneumonia,  Multiple pulmonary nodules on CT   Pulmonary exam normal breath sounds clear to auscultation       Cardiovascular (-) hypertensionnegative cardio ROS Normal cardiovascular exam Rhythm:Regular Rate:Normal     Neuro/Psych negative neurological ROS     GI/Hepatic Hepatic metastasis on CT CAT scan showed a very large complex mass in the sigmoid with 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  Reproductive/Obstetrics                           Anesthesia Physical Anesthesia Plan  ASA: IV  Anesthesia Plan: General   Post-op Pain Management:    Induction: Intravenous and Rapid sequence  PONV Risk Score and Plan: 2 and Dexamethasone, Ondansetron and Midazolam  Airway Management Planned: Oral ETT  Additional Equipment:   Intra-op Plan:   Post-operative Plan: Extubation in OR  Informed Consent: I have reviewed the patients History and Physical, chart, labs and discussed the procedure including the risks, benefits and alternatives for the proposed anesthesia with the patient or authorized representative who has indicated his/her understanding and acceptance.   Dental advisory given  Plan Discussed  with: CRNA  Anesthesia Plan Comments:        Anesthesia Quick Evaluation

## 2018-04-15 NOTE — Progress Notes (Signed)
Patient ID: Miguel Gamble, male   DOB: 12/29/1950, 67 y.o.   MRN: [id number]  This NP visited patient at the bedside to introduce myself and the role of palliative medicine in his treatment plan.  His wife is at bedside.   Patient is for bilateral nephrostomy placement today and is a bit overwhelmed with his current situation.  Discussed with patient the importance of continued conversation with his family and the medical providers regarding overall plan of care and treatment options,  ensuring decisions are within the context of the patients values and GOCs.  He is open to meeting with me tomorrow for GOCs discussion.   Questions and concerns addressed     No charge  Wadie Lessen NP  Palliative Medicine Team Team Phone # [phone] Pager [phone]

## 2018-04-15 NOTE — Consult Note (Signed)
Urology Consult   Physician requesting consult: Dr. Stark Klein  Reason for consult: Bilateral hydronephrosis, bladder mass, AKI  History of Present Illness: Miguel Gamble is a 67 y.o. with limited prior medical care who presented with a few month history of abdominal bloating and discomfort and a 30-40 lb weight loss.  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  Specifically within the urinary tract, he has evidence of left hydronephrosis and right renal pelvic fullness.  He has hyperdense areas within the bladder that may represent clot vs metastatic tumor deposits.  He denies gross hematuria or other urinary symptoms.  Dr. Barry Dienes is discussing options for management of his partial bowel obstruction with GI with plans for a stent vs ostomy diversion.   History reviewed. No pertinent past medical history.  History reviewed. No pertinent surgical history.  Current Hospital Medications:  Home Meds:  No current facility-administered medications on file prior to encounter.    Current Outpatient Medications on File Prior to Encounter  Medication Sig Dispense Refill  . aspirin 325 MG EC tablet Take 325 mg by mouth every 6 (six) hours as needed for pain.       Scheduled Meds: . [START ON 04/16/2018] Influenza vac split quadrivalent PF  0.5 mL Intramuscular Tomorrow-1000  . [START ON 04/16/2018] pneumococcal 23 valent vaccine  0.5 mL Intramuscular Tomorrow-1000   Continuous Infusions: . sodium chloride 100 mL/hr at 04/15/18 0952  . azithromycin Stopped (04/15/18 0022)  . cefTRIAXone (ROCEPHIN)  IV Stopped (04/14/18 2314)   PRN Meds:.acetaminophen **OR** acetaminophen, LORazepam, morphine injection, ondansetron **OR** ondansetron (ZOFRAN) IV  Allergies:  Allergies  Allergen Reactions  . Other Rash    Wheat straw    Family History  Problem Relation Age of Onset  . Lung cancer  Mother   . Emphysema Father   . Heart failure Father     Social History:  reports that he has never smoked. He has never used smokeless tobacco. No history on file for alcohol and drug.  ROS: A complete review of systems was performed.  All systems are negative except for pertinent findings as noted.  Physical Exam:  Vital signs in last 24 hours: Temp:  [97.8 F (36.6 C)-98 F (36.7 C)] 98 F (36.7 C) (12/31 0523) Pulse Rate:  [95-109] 101 (12/31 0523) Resp:  [16-20] 19 (12/31 0523) BP: (115-147)/(66-87) 117/66 (12/31 0523) SpO2:  [93 %-100 %] 93 % (12/31 0523) Weight:  [57.2 kg-59.7 kg] 59.7 kg (12/31 0523) Constitutional:  Alert and oriented, No acute distress Cardiovascular: Regular rate and rhythm, No JVD Respiratory: Normal respiratory effort, Lungs clear bilaterally GI: Abdomen is distended with distant bowel sounds.  No tenderness. GU: No CVA tenderness Lymphatic: No lymphadenopathy Neurologic: Grossly intact, no focal deficits Psychiatric: Normal mood and affect  Laboratory Data:  Recent Labs    04/14/18 1801 04/15/18 0603  WBC 20.3* 16.1*  HGB 8.9* 7.5*  HCT 29.3* 23.7*  PLT 315 311    Recent Labs    04/14/18 1801 04/15/18 0603  NA 144 145  K 4.0 3.2*  CL 111 115*  GLUCOSE 125* 99  BUN 75* 76*  CALCIUM 9.0 8.3*  CREATININE 2.83* 2.24*     Results for orders placed or performed during the hospital encounter of 04/14/18 (from the past 24 hour(s))  Lipase, blood     Status: None   Collection Time: 04/14/18  6:01  PM  Result Value Ref Range   Lipase 23 11 - 51 U/L  Comprehensive metabolic panel     Status: Abnormal   Collection Time: 04/14/18  6:01 PM  Result Value Ref Range   Sodium 144 135 - 145 mmol/L   Potassium 4.0 3.5 - 5.1 mmol/L   Chloride 111 98 - 111 mmol/L   CO2 18 (L) 22 - 32 mmol/L   Glucose, Bld 125 (H) 70 - 99 mg/dL   BUN 75 (H) 8 - 23 mg/dL   Creatinine, Ser 2.83 (H) 0.61 - 1.24 mg/dL   Calcium 9.0 8.9 - 10.3 mg/dL   Total  Protein 7.3 6.5 - 8.1 g/dL   Albumin 3.4 (L) 3.5 - 5.0 g/dL   AST 47 (H) 15 - 41 U/L   ALT 26 0 - 44 U/L   Alkaline Phosphatase 83 38 - 126 U/L   Total Bilirubin 1.0 0.3 - 1.2 mg/dL   GFR calc non Af Amer 22 (L) >60 mL/min   GFR calc Af Amer 25 (L) >60 mL/min   Anion gap 15 5 - 15  CBC     Status: Abnormal   Collection Time: 04/14/18  6:01 PM  Result Value Ref Range   WBC 20.3 (H) 4.0 - 10.5 K/uL   RBC 3.05 (L) 4.22 - 5.81 MIL/uL   Hemoglobin 8.9 (L) 13.0 - 17.0 g/dL   HCT 29.3 (L) 39.0 - 52.0 %   MCV 96.1 80.0 - 100.0 fL   MCH 29.2 26.0 - 34.0 pg   MCHC 30.4 30.0 - 36.0 g/dL   RDW 15.0 11.5 - 15.5 %   Platelets 315 150 - 400 K/uL   nRBC 0.0 0.0 - 0.2 %  I-Stat CG4 Lactic Acid, ED     Status: Abnormal   Collection Time: 04/14/18  6:08 PM  Result Value Ref Range   Lactic Acid, Venous 3.65 (HH) 0.5 - 1.9 mmol/L   Comment NOTIFIED PHYSICIAN   POC occult blood, ED     Status: None   Collection Time: 04/14/18  6:20 PM  Result Value Ref Range   Fecal Occult Bld NEGATIVE NEGATIVE  Type and screen Katie     Status: None   Collection Time: 04/14/18  7:13 PM  Result Value Ref Range   ABO/RH(D) A POS    Antibody Screen NEG    Sample Expiration      04/17/2018 Performed at [facility], [place] [address] [place], [place] [address]   ABO/Rh     Status: None   Collection Time: 04/14/18  7:13 PM  Result Value Ref Range   ABO/RH(D)      A POS Performed at [facility], [place] [address] [place], [place] [address]   I-Stat CG4 Lactic Acid, ED     Status: None   Collection Time: 04/14/18  8:09 PM  Result Value Ref Range   Lactic Acid, Venous 1.86 0.5 - 1.9 mmol/L  Urinalysis, Routine w reflex microscopic     Status: Abnormal   Collection Time: 04/14/18  8:53 PM  Result Value Ref Range   Color, Urine AMBER (A) YELLOW   APPearance CLOUDY (A) CLEAR   Specific Gravity, Urine 1.021 1.005 - 1.030   pH 5.0 5.0 - 8.0    Glucose, UA NEGATIVE NEGATIVE mg/dL   Hgb urine dipstick LARGE (A) NEGATIVE   Bilirubin Urine NEGATIVE NEGATIVE   Ketones, ur NEGATIVE NEGATIVE mg/dL   Protein, ur 100 (A)  NEGATIVE mg/dL   Nitrite NEGATIVE NEGATIVE   Leukocytes, UA NEGATIVE NEGATIVE   RBC / HPF >50 (H) 0 - 5 RBC/hpf   WBC, UA 6-10 0 - 5 WBC/hpf   Bacteria, UA RARE (A) NONE SEEN   Mucus PRESENT    Hyaline Casts, UA PRESENT   Strep pneumoniae urinary antigen     Status: None   Collection Time: 04/14/18  8:53 PM  Result Value Ref Range   Strep Pneumo Urinary Antigen NEGATIVE NEGATIVE  Basic metabolic panel     Status: Abnormal   Collection Time: 04/15/18  6:03 AM  Result Value Ref Range   Sodium 145 135 - 145 mmol/L   Potassium 3.2 (L) 3.5 - 5.1 mmol/L   Chloride 115 (H) 98 - 111 mmol/L   CO2 17 (L) 22 - 32 mmol/L   Glucose, Bld 99 70 - 99 mg/dL   BUN 76 (H) 8 - 23 mg/dL   Creatinine, Ser 2.24 (H) 0.61 - 1.24 mg/dL   Calcium 8.3 (L) 8.9 - 10.3 mg/dL   GFR calc non Af Amer 29 (L) >60 mL/min   GFR calc Af Amer 34 (L) >60 mL/min   Anion gap 13 5 - 15  CBC WITH DIFFERENTIAL     Status: Abnormal   Collection Time: 04/15/18  6:03 AM  Result Value Ref Range   WBC 16.1 (H) 4.0 - 10.5 K/uL   RBC 2.62 (L) 4.22 - 5.81 MIL/uL   Hemoglobin 7.5 (L) 13.0 - 17.0 g/dL   HCT 23.7 (L) 39.0 - 52.0 %   MCV 90.5 80.0 - 100.0 fL   MCH 28.6 26.0 - 34.0 pg   MCHC 31.6 30.0 - 36.0 g/dL   RDW 14.7 11.5 - 15.5 %   Platelets 311 150 - 400 K/uL   nRBC 0.0 0.0 - 0.2 %   Neutrophils Relative % 90 %   Neutro Abs 14.6 (H) 1.7 - 7.7 K/uL   Lymphocytes Relative 4 %   Lymphs Abs 0.6 (L) 0.7 - 4.0 K/uL   Monocytes Relative 5 %   Monocytes Absolute 0.8 0.1 - 1.0 K/uL   Eosinophils Relative 0 %   Eosinophils Absolute 0.0 0.0 - 0.5 K/uL   Basophils Relative 0 %   Basophils Absolute 0.0 0.0 - 0.1 K/uL   Immature Granulocytes 1 %   Abs Immature Granulocytes 0.09 (H) 0.00 - 0.07 K/uL   Polychromasia PRESENT   Glucose, capillary     Status:  None   Collection Time: 04/15/18  9:52 AM  Result Value Ref Range   Glucose-Capillary 85 70 - 99 mg/dL   Comment 1 Notify RN    Comment 2 Document in Chart    No results found for this or any previous visit (from the past 240 hour(s)).  Renal Function: Recent Labs    04/14/18 1801 04/15/18 0603  CREATININE 2.83* 2.24*   Estimated Creatinine Clearance: 26.7 mL/min (A) (by C-G formula based on SCr of 2.24 mg/dL (H)).  Radiologic Imaging: Ct Abdomen Pelvis Wo Contrast  Result Date: 04/14/2018 CLINICAL DATA:  Chronic weight loss. Abnormal radiograph. Further evaluation requested. EXAM: CT CHEST, ABDOMEN AND PELVIS WITHOUT CONTRAST TECHNIQUE: Multidetector CT imaging of the chest, abdomen and pelvis was performed following the standard protocol without IV contrast. COMPARISON:  None. FINDINGS: CT CHEST FINDINGS Cardiovascular: The heart is normal in size. Mild calcification is noted at the aortic arch. The great vessels are unremarkable in appearance. Mediastinum/Nodes: The esophagus is filled with contrast, which may  reflect gastroesophageal reflux or esophageal dysmotility. Wall thickening along the esophagus may reflect esophagitis or possibly metastatic disease. Mild achalasia cannot be excluded. Mild coronary artery calcification is seen. An enlarged 1.8 cm precarinal node is suspicious for metastatic disease. A trace pericardial effusion is noted. The visualized portions of the thyroid gland are unremarkable. No axillary lymphadenopathy is seen. Lungs/Pleura: Patchy bilateral airspace opacities are noted, predominantly at the right upper lobe and left lower lobe, concerning for pneumonia. Lymphangitic spread of tumor could conceivably have a similar appearance. Multiple large pulmonary nodules are noted bilaterally, measuring up to 1.7 cm in size, compatible with metastatic disease. No pleural effusion or pneumothorax is seen. Musculoskeletal: No acute osseous abnormalities are identified. The  visualized musculature is unremarkable in appearance. CT ABDOMEN PELVIS FINDINGS Hepatobiliary: Vague lesions within the liver measure up to 3.5 cm in size, compatible with metastatic disease. The gallbladder is not definitely characterized. The common bile duct is normal in caliber. Pancreas: The pancreas is within normal limits. Peripancreatic nodes measure up to 1.4 cm in short axis, concerning for metastatic disease. Spleen: The spleen is diminutive and unremarkable in appearance. Adrenals/Urinary Tract: The adrenal glands are grossly unremarkable in appearance. Scattered bilateral renal cysts are noted, more prominent on the right. There is mild chronic left-sided hydronephrosis, with prominence of the proximal left ureter to the level of the lower abdomen, reflecting obstruction by the large lower abdominal and pelvic mass. No renal or ureteral stones are identified. Mild left-sided perinephric stranding is noted. Stomach/Bowel: There is diffuse dilatation of small-bowel loops to 6.0 cm in diameter, and distention of the cecum to 12.0 cm in diameter. The small and large bowel are diffusely dilated. The stomach is largely filled with contrast. Distention extends to the level of the proximal sigmoid colon. 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 Wall thickening is noted along the adjacent small and large bowel loops, and the mass measures approximately 16 x 9 cm. Wall thickening extends to the rectum, with diffuse presacral stranding. This most likely arises from the sigmoid colon, though a small bowel or bladder source is also possible. Vascular/Lymphatic: Scattered calcification is seen along the abdominal aorta and its branches. The abdominal aorta is otherwise grossly unremarkable. The inferior vena cava is grossly unremarkable.  Scattered enlarged mesenteric and retroperitoneal nodes are seen, measuring up to 1.5 cm in short axis. Vague mass tracks directly anterior to the aortic bifurcation and along the pelvic sidewall bilaterally. A 1.9 cm node is seen along the left external iliac vessels. Reproductive: Multiple masses are noted within the bladder, with diffuse irregular bladder wall thickening at the dome of the bladder, reflecting diffuse spread of disease. The prostate is borderline enlarged, measuring 4.9 cm in transverse dimension. Other: Trace fluid is noted at a small right inguinal hernia. Musculoskeletal: No acute osseous abnormalities are identified. The visualized musculature is unremarkable in appearance. IMPRESSION: 1. 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 The mass measures 16 x 9 cm. Wall thickening extends along the adjacent small and large bowel loops. 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 2. Marked diffuse dilatation of the small and large bowel loops, reflecting partial bowel obstruction by the mass. 3. Extensive retroperitoneal, mesenteric and pelvic sidewall metastatic lymphadenopathy noted. Enlarged mediastinal node also noted, concerning for metastatic disease. 4. Multiple pulmonary nodules noted bilaterally, compatible with metastatic disease. 5. Wall thickening along the esophagus may reflect esophagitis or possibly metastatic disease. Esophagus filled with contrast, possibly reflecting gastroesophageal reflux or esophageal dysmotility. Mild achalasia cannot be excluded. 6. Mild chronic left-sided hydronephrosis, reflecting obstruction by the large lower abdominal and pelvic mass. 7. Multiple masses within the bladder, with diffuse irregular bladder wall  thickening at the dome of the bladder, reflecting diffuse spread of malignancy. 8. Scattered hepatic metastases noted. 9. Patchy bilateral airspace opacities, predominantly at the right upper lobe and left lower lobe, concerning for pneumonia. Lymphangitic spread of tumor could conceivably have a similar appearance. 10. Bilateral renal cysts. 11. Trace pericardial effusion noted. Aortic Atherosclerosis (ICD10-I70.0). These results were called by telephone at the time of interpretation on 04/14/2018 at 9:41 pm to [facility] PA, who verbally acknowledged these results. Electronically Signed   By: Garald Balding M.D.   On: 04/14/2018 21:48   Ct Chest Wo Contrast  Result Date: 04/14/2018 CLINICAL DATA:  Chronic weight loss. Abnormal radiograph. Further evaluation requested. EXAM: CT CHEST, ABDOMEN AND PELVIS WITHOUT CONTRAST TECHNIQUE: Multidetector CT imaging of the chest, abdomen and pelvis was performed following the standard protocol without IV contrast. COMPARISON:  None. FINDINGS: CT CHEST FINDINGS Cardiovascular: The heart is normal in size. Mild calcification is noted at the aortic arch. The great vessels are unremarkable in appearance. Mediastinum/Nodes: The esophagus is filled with contrast, which may reflect gastroesophageal reflux or esophageal dysmotility. Wall thickening along the esophagus may reflect esophagitis or possibly metastatic disease. Mild achalasia cannot be excluded. Mild coronary artery calcification is seen. An enlarged 1.8 cm precarinal node is suspicious for metastatic disease. A trace pericardial effusion is noted. The visualized portions of the thyroid gland are unremarkable. No axillary lymphadenopathy is seen. Lungs/Pleura: Patchy bilateral airspace opacities are noted, predominantly at the right upper lobe and left lower lobe, concerning for pneumonia. Lymphangitic spread of tumor could conceivably have a similar appearance. Multiple large pulmonary nodules are noted  bilaterally, measuring up to 1.7 cm in size, compatible with metastatic disease. No pleural effusion or pneumothorax is seen. Musculoskeletal: No acute osseous abnormalities are identified. The visualized musculature is unremarkable in appearance. CT ABDOMEN PELVIS FINDINGS Hepatobiliary: Vague lesions within the liver measure up to 3.5 cm in size, compatible with metastatic disease. The gallbladder is not definitely characterized. The common bile duct is normal in caliber. Pancreas: The pancreas is within normal limits. Peripancreatic nodes measure up to 1.4 cm in short axis, concerning for metastatic disease. Spleen: The spleen is diminutive and unremarkable in appearance. Adrenals/Urinary Tract: The adrenal glands are grossly unremarkable in appearance. Scattered bilateral renal cysts are noted, more prominent on the right. There is mild chronic left-sided hydronephrosis, with prominence of the proximal left ureter to the level of the lower abdomen, reflecting obstruction by the large lower abdominal and pelvic mass. No renal or ureteral stones are identified. Mild left-sided perinephric stranding is noted. Stomach/Bowel: There is diffuse dilatation of small-bowel loops to 6.0 cm in diameter, and distention of the cecum to 12.0 cm in diameter. The small and large bowel are diffusely dilated. The stomach is largely filled with contrast. Distention extends to the level of the proximal sigmoid colon. A very large  complex mass is noted at the mid to lower abdomen and pelvis, which appears to include a fistula between multiple loops of small bowel, the cecum and sigmoid colon, with stool in the upper aspect of the mass. Wall thickening is noted along the adjacent small and large bowel loops, and the mass measures approximately 16 x 9 cm. Wall thickening extends to the rectum, with diffuse presacral stranding. This most likely arises from the sigmoid colon, though a small bowel or bladder source is also possible.  Vascular/Lymphatic: Scattered calcification is seen along the abdominal aorta and its branches. The abdominal aorta is otherwise grossly unremarkable. The inferior vena cava is grossly unremarkable. Scattered enlarged mesenteric and retroperitoneal nodes are seen, measuring up to 1.5 cm in short axis. Vague mass tracks directly anterior to the aortic bifurcation and along the pelvic sidewall bilaterally. A 1.9 cm node is seen along the left external iliac vessels. Reproductive: Multiple masses are noted within the bladder, with diffuse irregular bladder wall thickening at the dome of the bladder, reflecting diffuse spread of disease. The prostate is borderline enlarged, measuring 4.9 cm in transverse dimension. Other: Trace fluid is noted at a small right inguinal hernia. Musculoskeletal: No acute osseous abnormalities are identified. The visualized musculature is unremarkable in appearance. IMPRESSION: 1. 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 The mass measures 16 x 9 cm. Wall thickening extends along the adjacent small and large bowel loops. 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 2. Marked diffuse dilatation of the small and large bowel loops, reflecting partial bowel obstruction by the mass. 3. Extensive retroperitoneal, mesenteric and pelvic sidewall metastatic lymphadenopathy noted. Enlarged mediastinal node also noted, concerning for metastatic disease. 4. Multiple pulmonary nodules noted bilaterally, compatible with metastatic disease. 5. Wall thickening along the esophagus may reflect esophagitis or possibly metastatic disease. Esophagus filled with contrast, possibly reflecting gastroesophageal reflux or esophageal dysmotility. Mild achalasia cannot be  excluded. 6. Mild chronic left-sided hydronephrosis, reflecting obstruction by the large lower abdominal and pelvic mass. 7. Multiple masses within the bladder, with diffuse irregular bladder wall thickening at the dome of the bladder, reflecting diffuse spread of malignancy. 8. Scattered hepatic metastases noted. 9. Patchy bilateral airspace opacities, predominantly at the right upper lobe and left lower lobe, concerning for pneumonia. Lymphangitic spread of tumor could conceivably have a similar appearance. 10. Bilateral renal cysts. 11. Trace pericardial effusion noted. Aortic Atherosclerosis (ICD10-I70.0). These results were called by telephone at the time of interpretation on 04/14/2018 at 9:41 pm to [facility] PA, who verbally acknowledged these results. Electronically Signed   By: Garald Balding M.D.   On: 04/14/2018 21:48    I independently reviewed the above imaging studies.  Impression/Recommendation  1. Pelvic mass with metastatic disease with hydronephrosis and AKI: I have recommended he proceed with cystoscopy with attempt at bilateral ureteral stent placement to relieve obstruction and optimize renal function.  In addition, I will plan for cystoscopic biopsy of any tissue lesions identified within the bladder to aid with tissue diagnosis of his primary malignancy.  I discussed the potential benefits and risks of the procedure, side effects of the proposed treatment, the likelihood of the patient achieving the goals of the procedure, and any potential problems that might occur during  the procedure or recuperation.  He gives informed consent and we will keep him NPO and proceed later today.  Maddyson Keil,LES 04/15/2018, 10:11 AM    Pryor Curia MD   CC: Dr. Stark Klein

## 2018-04-15 NOTE — Anesthesia Postprocedure Evaluation (Signed)
Anesthesia Post Note  Patient: Miguel Gamble  Procedure(s) Performed: CYSTOSCOPY BILATERAL WITH RETROGRADE PYELOGRAM/URETERAL BILATERAL STENT PLACEMENT WITH TRANSURETHRAL RESECTION OF BLADDER TUMOR (Bilateral )     Patient location during evaluation: PACU Anesthesia Type: General Level of consciousness: awake and alert Pain management: pain level controlled Vital Signs Assessment: post-procedure vital signs reviewed and stable Respiratory status: spontaneous breathing, nonlabored ventilation, respiratory function stable and patient connected to nasal cannula oxygen Cardiovascular status: blood pressure returned to baseline, stable and tachycardic Postop Assessment: no apparent nausea or vomiting Anesthetic complications: no    Last Vitals:  Vitals:   04/15/18 1719 04/15/18 1730  BP: 137/88 137/86  Pulse: (!) 104 (!) 103  Resp: (!) 25 (!) 24  Temp: 37.1 C   SpO2: 100% 93%    Last Pain:  Vitals:   04/15/18 1730  TempSrc:   PainSc: 0-No pain                 Catalina Gravel

## 2018-04-15 NOTE — Consult Note (Addendum)
Reason for Consult:  bowel obstruction Referring Physician: [name] is an 67 y.o. male.  HPI:  Patient is a 67 year old male who presents with approximately 4 to 6 months of progressive abdominal distention, decreased appetite, weight loss, and weakness.  His symptoms were worsening more quickly and he was seen at [facility] walk-in clinic today.  He was found to be anemic and had an elevated creatinine.  He was also felt to possibly have pneumonia and a bowel obstruction.  He was referred to the emergency department based on these findings.  He has lost approximately 40 pounds.  He has had a significant decrease in amount of stool.  He has had some formed stools and some diarrhea.  He has not had any significant abdominal pain except after eating a dish inadvertently that had significant amounts of garlic in it.  He has had a known garlic intolerance and felt like that pain episode was definitely associated with that.  That type of pain has resolved.  He continues to pass gas.  He has had some nausea but minimal vomiting.  He denies knowledge of blood in his stool or urine.  He has not had any chest pain or shortness of breath.  The patient denies air in his urine.  He has not had a colonoscopy in the past.  The only family history of cancer that he has is a mother that had lung cancer with extensive smoking history.  The patient reports being very fit previously but during the same time.  Of weight loss, he has had significant decrease in his tolerance for exercise.  History reviewed. No pertinent past medical history.  History reviewed. No pertinent surgical history.  Family History  Problem Relation Age of Onset  . Lung cancer Mother   . Emphysema Father   . Heart failure Father     Social History:  reports that he has never smoked. He has never used smokeless tobacco. No history on file for alcohol and drug.  Allergies:  Allergies  Allergen Reactions  . Other Rash    Wheat  straw    Medications:  Current Meds  Medication Sig  . aspirin 325 MG EC tablet Take 325 mg by mouth every 6 (six) hours as needed for pain.     Results for orders placed or performed during the hospital encounter of 04/14/18 (from the past 48 hour(s))  Lipase, blood     Status: None   Collection Time: 04/14/18  6:01 PM  Result Value Ref Range   Lipase 23 11 - 51 U/L    Comment: Performed at [facility], [place] [address], [place], [place] [address]  Comprehensive metabolic panel     Status: Abnormal   Collection Time: 04/14/18  6:01 PM  Result Value Ref Range   Sodium 144 135 - 145 mmol/L   Potassium 4.0 3.5 - 5.1 mmol/L   Chloride 111 98 - 111 mmol/L   CO2 18 (L) 22 - 32 mmol/L   Glucose, Bld 125 (H) 70 - 99 mg/dL   BUN 75 (H) 8 - 23 mg/dL   Creatinine, Ser 2.83 (H) 0.61 - 1.24 mg/dL   Calcium 9.0 8.9 - 10.3 mg/dL   Total Protein 7.3 6.5 - 8.1 g/dL   Albumin 3.4 (L) 3.5 - 5.0 g/dL   AST 47 (H) 15 - 41 U/L   ALT 26 0 - 44 U/L   Alkaline Phosphatase 83 38 - 126 U/L   Total Bilirubin  1.0 0.3 - 1.2 mg/dL   GFR calc non Af Amer 22 (L) >60 mL/min   GFR calc Af Amer 25 (L) >60 mL/min   Anion gap 15 5 - 15    Comment: Performed at [facility], [place] [address], [place], [place] [address]  CBC     Status: Abnormal   Collection Time: 04/14/18  6:01 PM  Result Value Ref Range   WBC 20.3 (H) 4.0 - 10.5 K/uL   RBC 3.05 (L) 4.22 - 5.81 MIL/uL   Hemoglobin 8.9 (L) 13.0 - 17.0 g/dL   HCT 29.3 (L) 39.0 - 52.0 %   MCV 96.1 80.0 - 100.0 fL   MCH 29.2 26.0 - 34.0 pg   MCHC 30.4 30.0 - 36.0 g/dL   RDW 15.0 11.5 - 15.5 %   Platelets 315 150 - 400 K/uL   nRBC 0.0 0.0 - 0.2 %    Comment: Performed at [facility], [place] [address] [place], [place] [address]  I-Stat CG4 Lactic Acid, ED     Status: Abnormal   Collection Time: 04/14/18  6:08 PM  Result Value Ref Range   Lactic Acid, Venous 3.65 (HH) 0.5 - 1.9 mmol/L   Comment NOTIFIED  PHYSICIAN   POC occult blood, ED     Status: None   Collection Time: 04/14/18  6:20 PM  Result Value Ref Range   Fecal Occult Bld NEGATIVE NEGATIVE  Type and screen [place]     Status: None   Collection Time: 04/14/18  7:13 PM  Result Value Ref Range   ABO/RH(D) A POS    Antibody Screen NEG    Sample Expiration      04/17/2018 Performed at [facility], [place] [address], [place], [place] [address]   ABO/Rh     Status: None (Preliminary result)   Collection Time: 04/14/18  7:13 PM  Result Value Ref Range   ABO/RH(D)      A POS Performed at [facility], [place] [address] [place], [place] [address]   I-Stat CG4 Lactic Acid, ED     Status: None   Collection Time: 04/14/18  8:09 PM  Result Value Ref Range   Lactic Acid, Venous 1.86 0.5 - 1.9 mmol/L  Urinalysis, Routine w reflex microscopic     Status: Abnormal   Collection Time: 04/14/18  8:53 PM  Result Value Ref Range   Color, Urine AMBER (A) YELLOW    Comment: BIOCHEMICALS MAY BE AFFECTED BY COLOR   APPearance CLOUDY (A) CLEAR   Specific Gravity, Urine 1.021 1.005 - 1.030   pH 5.0 5.0 - 8.0   Glucose, UA NEGATIVE NEGATIVE mg/dL   Hgb urine dipstick LARGE (A) NEGATIVE   Bilirubin Urine NEGATIVE NEGATIVE   Ketones, ur NEGATIVE NEGATIVE mg/dL   Protein, ur 100 (A) NEGATIVE mg/dL   Nitrite NEGATIVE NEGATIVE   Leukocytes, UA NEGATIVE NEGATIVE   RBC / HPF >50 (H) 0 - 5 RBC/hpf   WBC, UA 6-10 0 - 5 WBC/hpf   Bacteria, UA RARE (A) NONE SEEN   Mucus PRESENT    Hyaline Casts, UA PRESENT     Comment: Performed at [facility], [place] [address], [place], [place] [address]    Ct Abdomen Pelvis Wo Contrast  Result Date: 04/14/2018 CLINICAL DATA:  Chronic weight loss. Abnormal radiograph. Further evaluation requested. EXAM: CT CHEST, ABDOMEN AND PELVIS WITHOUT CONTRAST TECHNIQUE: Multidetector CT imaging of the chest, abdomen and  pelvis was performed  following the standard protocol without IV contrast. COMPARISON:  None. FINDINGS: CT CHEST FINDINGS Cardiovascular: The heart is normal in size. Mild calcification is noted at the aortic arch. The great vessels are unremarkable in appearance. Mediastinum/Nodes: The esophagus is filled with contrast, which may reflect gastroesophageal reflux or esophageal dysmotility. Wall thickening along the esophagus may reflect esophagitis or possibly metastatic disease. Mild achalasia cannot be excluded. Mild coronary artery calcification is seen. An enlarged 1.8 cm precarinal node is suspicious for metastatic disease. A trace pericardial effusion is noted. The visualized portions of the thyroid gland are unremarkable. No axillary lymphadenopathy is seen. Lungs/Pleura: Patchy bilateral airspace opacities are noted, predominantly at the right upper lobe and left lower lobe, concerning for pneumonia. Lymphangitic spread of tumor could conceivably have a similar appearance. Multiple large pulmonary nodules are noted bilaterally, measuring up to 1.7 cm in size, compatible with metastatic disease. No pleural effusion or pneumothorax is seen. Musculoskeletal: No acute osseous abnormalities are identified. The visualized musculature is unremarkable in appearance. CT ABDOMEN PELVIS FINDINGS Hepatobiliary: Vague lesions within the liver measure up to 3.5 cm in size, compatible with metastatic disease. The gallbladder is not definitely characterized. The common bile duct is normal in caliber. Pancreas: The pancreas is within normal limits. Peripancreatic nodes measure up to 1.4 cm in short axis, concerning for metastatic disease. Spleen: The spleen is diminutive and unremarkable in appearance. Adrenals/Urinary Tract: The adrenal glands are grossly unremarkable in appearance. Scattered bilateral renal cysts are noted, more prominent on the right. There is mild chronic left-sided hydronephrosis, with prominence of the proximal left ureter  to the level of the lower abdomen, reflecting obstruction by the large lower abdominal and pelvic mass. No renal or ureteral stones are identified. Mild left-sided perinephric stranding is noted. Stomach/Bowel: There is diffuse dilatation of small-bowel loops to 6.0 cm in diameter, and distention of the cecum to 12.0 cm in diameter. The small and large bowel are diffusely dilated. The stomach is largely filled with contrast. Distention extends to the level of the proximal sigmoid colon. 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 Wall thickening is noted along the adjacent small and large bowel loops, and the mass measures approximately 16 x 9 cm. Wall thickening extends to the rectum, with diffuse presacral stranding. This most likely arises from the sigmoid colon, though a small bowel or bladder source is also possible. Vascular/Lymphatic: Scattered calcification is seen along the abdominal aorta and its branches. The abdominal aorta is otherwise grossly unremarkable. The inferior vena cava is grossly unremarkable. Scattered enlarged mesenteric and retroperitoneal nodes are seen, measuring up to 1.5 cm in short axis. Vague mass tracks directly anterior to the aortic bifurcation and along the pelvic sidewall bilaterally. A 1.9 cm node is seen along the left external iliac vessels. Reproductive: Multiple masses are noted within the bladder, with diffuse irregular bladder wall thickening at the dome of the bladder, reflecting diffuse spread of disease. The prostate is borderline enlarged, measuring 4.9 cm in transverse dimension. Other: Trace fluid is noted at a small right inguinal hernia. Musculoskeletal: No acute osseous abnormalities are identified. The visualized musculature is unremarkable in appearance. IMPRESSION: 1. Very large  complex mass at the mid to lower abdomen and pelvis, which appears to include a fistula between multiple loops  of small bowel, the cecum and sigmoid colon, with stool in the upper aspect of the mass. The mass measures 16 x 9 cm. Wall thickening extends along the adjacent small and large bowel loops. 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 2. Marked diffuse dilatation of the small and large bowel loops, reflecting partial bowel obstruction by the mass. 3. Extensive retroperitoneal, mesenteric and pelvic sidewall metastatic lymphadenopathy noted. Enlarged mediastinal node also noted, concerning for metastatic disease. 4. Multiple pulmonary nodules noted bilaterally, compatible with metastatic disease. 5. Wall thickening along the esophagus may reflect esophagitis or possibly metastatic disease. Esophagus filled with contrast, possibly reflecting gastroesophageal reflux or esophageal dysmotility. Mild achalasia cannot be excluded. 6. Mild chronic left-sided hydronephrosis, reflecting obstruction by the large lower abdominal and pelvic mass. 7. Multiple masses within the bladder, with diffuse irregular bladder wall thickening at the dome of the bladder, reflecting diffuse spread of malignancy. 8. Scattered hepatic metastases noted. 9. Patchy bilateral airspace opacities, predominantly at the right upper lobe and left lower lobe, concerning for pneumonia. Lymphangitic spread of tumor could conceivably have a similar appearance. 10. Bilateral renal cysts. 11. Trace pericardial effusion noted. Aortic Atherosclerosis (ICD10-I70.0). These results were called by telephone at the time of interpretation on 04/14/2018 at 9:41 pm to [facility] PA, who verbally acknowledged these results. Electronically Signed   By: Garald Balding M.D.   On: 04/14/2018 21:48   Ct Chest Wo Contrast  Result Date: 04/14/2018 CLINICAL DATA:  Chronic weight  loss. Abnormal radiograph. Further evaluation requested. EXAM: CT CHEST, ABDOMEN AND PELVIS WITHOUT CONTRAST TECHNIQUE: Multidetector CT imaging of the chest, abdomen and pelvis was performed following the standard protocol without IV contrast. COMPARISON:  None. FINDINGS: CT CHEST FINDINGS Cardiovascular: The heart is normal in size. Mild calcification is noted at the aortic arch. The great vessels are unremarkable in appearance. Mediastinum/Nodes: The esophagus is filled with contrast, which may reflect gastroesophageal reflux or esophageal dysmotility. Wall thickening along the esophagus may reflect esophagitis or possibly metastatic disease. Mild achalasia cannot be excluded. Mild coronary artery calcification is seen. An enlarged 1.8 cm precarinal node is suspicious for metastatic disease. A trace pericardial effusion is noted. The visualized portions of the thyroid gland are unremarkable. No axillary lymphadenopathy is seen. Lungs/Pleura: Patchy bilateral airspace opacities are noted, predominantly at the right upper lobe and left lower lobe, concerning for pneumonia. Lymphangitic spread of tumor could conceivably have a similar appearance. Multiple large pulmonary nodules are noted bilaterally, measuring up to 1.7 cm in size, compatible with metastatic disease. No pleural effusion or pneumothorax is seen. Musculoskeletal: No acute osseous abnormalities are identified. The visualized musculature is unremarkable in appearance. CT ABDOMEN PELVIS FINDINGS Hepatobiliary: Vague lesions within the liver measure up to 3.5 cm in size, compatible with metastatic disease. The gallbladder is not definitely characterized. The common bile duct is normal in caliber. Pancreas: The pancreas is within normal limits. Peripancreatic nodes measure up to 1.4 cm in short axis, concerning for metastatic disease. Spleen: The spleen is diminutive and unremarkable in appearance. Adrenals/Urinary Tract: The adrenal glands are grossly  unremarkable in appearance. Scattered bilateral renal cysts are noted, more prominent on the right. There is mild chronic left-sided hydronephrosis, with prominence of the proximal left ureter to the level of the lower abdomen, reflecting obstruction by the large lower abdominal and pelvic mass. No renal or ureteral stones are identified. Mild left-sided perinephric stranding is noted. Stomach/Bowel: There  is diffuse dilatation of small-bowel loops to 6.0 cm in diameter, and distention of the cecum to 12.0 cm in diameter. The small and large bowel are diffusely dilated. The stomach is largely filled with contrast. Distention extends to the level of the proximal sigmoid colon. 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 Wall thickening is noted along the adjacent small and large bowel loops, and the mass measures approximately 16 x 9 cm. Wall thickening extends to the rectum, with diffuse presacral stranding. This most likely arises from the sigmoid colon, though a small bowel or bladder source is also possible. Vascular/Lymphatic: Scattered calcification is seen along the abdominal aorta and its branches. The abdominal aorta is otherwise grossly unremarkable. The inferior vena cava is grossly unremarkable. Scattered enlarged mesenteric and retroperitoneal nodes are seen, measuring up to 1.5 cm in short axis. Vague mass tracks directly anterior to the aortic bifurcation and along the pelvic sidewall bilaterally. A 1.9 cm node is seen along the left external iliac vessels. Reproductive: Multiple masses are noted within the bladder, with diffuse irregular bladder wall thickening at the dome of the bladder, reflecting diffuse spread of disease. The prostate is borderline enlarged, measuring 4.9 cm in transverse dimension. Other: Trace fluid is  noted at a small right inguinal hernia. Musculoskeletal: No acute osseous abnormalities are identified. The visualized musculature is unremarkable in appearance. IMPRESSION: 1. 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 The mass measures 16 x 9 cm. Wall thickening extends along the adjacent small and large bowel loops. 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 2. Marked diffuse dilatation of the small and large bowel loops, reflecting partial bowel obstruction by the mass. 3. Extensive retroperitoneal, mesenteric and pelvic sidewall metastatic lymphadenopathy noted. Enlarged mediastinal node also noted, concerning for metastatic disease. 4. Multiple pulmonary nodules noted bilaterally, compatible with metastatic disease. 5. Wall thickening along the esophagus may reflect esophagitis or possibly metastatic disease. Esophagus filled with contrast, possibly reflecting gastroesophageal reflux or esophageal dysmotility. Mild achalasia cannot be excluded. 6. Mild chronic left-sided hydronephrosis, reflecting obstruction by the large lower abdominal and pelvic mass. 7. Multiple masses within the bladder, with diffuse irregular bladder wall thickening at the dome of the bladder, reflecting diffuse spread of malignancy. 8. Scattered hepatic metastases noted. 9. Patchy bilateral airspace opacities, predominantly at the right upper lobe and left lower lobe, concerning for pneumonia. Lymphangitic spread of tumor could conceivably have a similar appearance. 10. Bilateral renal cysts. 11. Trace pericardial effusion noted. Aortic Atherosclerosis (ICD10-I70.0). These results were called by telephone at the time of interpretation on 04/14/2018 at 9:41 pm to [facility] PA, who verbally  acknowledged these results. Electronically Signed   By: Garald Balding M.D.   On: 04/14/2018 21:48    Review of Systems  Constitutional: Positive for malaise/fatigue and weight loss.  HENT: Negative.        Significant dental work  Eyes: Negative.   Respiratory: Negative.   Cardiovascular: Negative.   Gastrointestinal: Positive for constipation, diarrhea, heartburn and nausea.       Progressive distention Decreased  amount of stool Decreased appetite  Genitourinary: Negative.   Skin: Negative.   Neurological: Negative.   Endo/Heme/Allergies:       Recent dx of anemia  Psychiatric/Behavioral: Negative.   All other systems reviewed and are negative.  Blood pressure 128/86, pulse 96, temperature 97.8 F (36.6 C), temperature source Oral, resp. rate 18, height 5\' 8"  (1.727 m), weight 57.2 kg, SpO2 97 %. Physical Exam  Constitutional: He is oriented to person, place, and time. He appears well-developed. No distress.  Very thin with distended abdomen.  Has appearance of previously fit person.    HENT:  Head: Normocephalic and atraumatic.  Mouth/Throat: Oropharynx is clear and moist.  Dental implants in good condition.   External ears normal.  No discharge.  No nasal discharge.   Moist oral mucosa.    Eyes: Pupils are equal, round, and reactive to light. Conjunctivae are normal. Right eye exhibits no discharge. Left eye exhibits no discharge. No scleral icterus.  Neck: Neck supple. No tracheal deviation present. No thyromegaly present.  Cardiovascular: Normal rate, regular rhythm and intact distal pulses.  Respiratory: Effort normal. No respiratory distress. He exhibits no tenderness.  Slightly tachypneic while talking.    GI: Soft. He exhibits distension. He exhibits no mass. There is no abdominal tenderness. There is no rebound and no guarding.  No hernias appreciated.  No hepatosplenomegaly.     Musculoskeletal: Normal range of motion.        General: No tenderness, deformity  or edema.  Lymphadenopathy:    He has no cervical adenopathy.  Neurological: He is alert and oriented to person, place, and time. Coordination normal.  Skin: Skin is warm and dry. No rash noted. He is not diaphoretic. No erythema. There is pallor.  Psychiatric: He has a normal mood and affect. His behavior is normal. Judgment and thought content normal.    Assessment/Plan: Partial large bowel obstruction Pelvic mass consistent with bladder cancer or sigmoid colon cancer and possible fistulization to small bowel.   Chronic blood loss anemia Hematuria Dehydration Acute kidney injury vs chronic kidney disease Right hydronephrosis Bladder masses Metastatic cancer likely given adenopathy seen in retroperitoneal, mesenteric, and pelvic location.   Hepatic masses Pulmonary masses/airspace disease.   Dilated esophagus  Patient appears to have a partial bowel obstruction from metastatic cancer.  This mass does appear to be quite complex.  The key at this point is to address his urgent issues including the bowel obstruction, dehydration, hydronephrosis, and leukocytosis.  Recommend GI consult to see if there is any form of stenting that could be done at this region of the mass.  I suspect that they will think that this is too high.  However, if he was able to have nonoperative treatment of the obstructing lesion, it would help him get to chemotherapy quicker.  I definitely would not recommend any sort of resection of this tumor initially.  His primary life-threatening disease will be the metastatic disease.  Relief of the obstruction with an ostomy would be the most likely surgical treatment if there is no stenting that is possible.  Additionally, if there is an element of small bowel obstruction associated with a large bowel obstruction, colonic stenting would not relieve this.  Patient will need a urology consult for the hydronephrosis and bladder masses.  It is possible that this could represent  locally advanced and metastatic bladder cancer.  Patient may need a right-sided stent from obstruction from the tumor, but would defer this decision to urology.  Recommend oncology consultation.  They will need at least a biopsy from the bladder, colon, or liver to make a tentative diagnosis.  This would help guide treatment.  The patient does have extensive disease but has been quite healthy.  If his acute issues can be resolved, he most likely would have good tolerance to chemotherapy.  He will need nutritional involvement after relief of the obstruction.  Because the patient is continuing to pass gas and primarily has a large bowel obstruction, I think we can hold off on the NG tube at this point.  I do recommend keeping him n.p.o. other than ice chips.  Will check prealbumin.    I would treat the patient for reflux based on the contrast in the esophagus.  I would wait on working up the esophagus until after the bowel obstruction is relieved.  I do not want him putting in more contrast or air from an endoscopy from an upper standpoint if he continues to have such dilated large bowel.  The patient's leukocytosis may be from multiple sources.  The patient has airspace disease which could reflect aspiration chronically.  The patient may also have some necrosis from a tumor.  Patient does not have evidence of sepsis or SIRS, however the patient could have some translocation of bacteria based on the colonic dilation.  We will follow this patient.  I do not anticipate general surgery procedure today.  This was discussed with the patient and spouse.    Stark Klein 04/15/2018, 12:05 AM

## 2018-04-16 ENCOUNTER — Encounter (HOSPITAL_COMMUNITY): Payer: Self-pay | Admitting: Urology

## 2018-04-16 DIAGNOSIS — R634 Abnormal weight loss: Secondary | ICD-10-CM

## 2018-04-16 DIAGNOSIS — Z7189 Other specified counseling: Secondary | ICD-10-CM

## 2018-04-16 DIAGNOSIS — Z978 Presence of other specified devices: Secondary | ICD-10-CM

## 2018-04-16 DIAGNOSIS — C772 Secondary and unspecified malignant neoplasm of intra-abdominal lymph nodes: Secondary | ICD-10-CM

## 2018-04-16 DIAGNOSIS — N179 Acute kidney failure, unspecified: Secondary | ICD-10-CM

## 2018-04-16 DIAGNOSIS — Z6820 Body mass index (BMI) 20.0-20.9, adult: Secondary | ICD-10-CM

## 2018-04-16 DIAGNOSIS — Z515 Encounter for palliative care: Secondary | ICD-10-CM

## 2018-04-16 DIAGNOSIS — I82401 Acute embolism and thrombosis of unspecified deep veins of right lower extremity: Secondary | ICD-10-CM

## 2018-04-16 DIAGNOSIS — K56600 Partial intestinal obstruction, unspecified as to cause: Principal | ICD-10-CM

## 2018-04-16 DIAGNOSIS — C187 Malignant neoplasm of sigmoid colon: Secondary | ICD-10-CM

## 2018-04-16 DIAGNOSIS — N2889 Other specified disorders of kidney and ureter: Secondary | ICD-10-CM

## 2018-04-16 HISTORY — PX: PORTA CATH INSERTION: CATH118285

## 2018-04-16 HISTORY — DX: Acute embolism and thrombosis of unspecified deep veins of right lower extremity: I82.401

## 2018-04-16 LAB — COMPREHENSIVE METABOLIC PANEL
ALT: 24 U/L (ref 0–44)
AST: 30 U/L (ref 15–41)
Albumin: 2.7 g/dL — ABNORMAL LOW (ref 3.5–5.0)
Alkaline Phosphatase: 72 U/L (ref 38–126)
Anion gap: 14 (ref 5–15)
BUN: 61 mg/dL — ABNORMAL HIGH (ref 8–23)
CO2: 16 mmol/L — ABNORMAL LOW (ref 22–32)
CREATININE: 1.98 mg/dL — AB (ref 0.61–1.24)
Calcium: 8.7 mg/dL — ABNORMAL LOW (ref 8.9–10.3)
Chloride: 120 mmol/L — ABNORMAL HIGH (ref 98–111)
GFR calc Af Amer: 39 mL/min — ABNORMAL LOW (ref >60)
GFR calc non Af Amer: 34 mL/min — ABNORMAL LOW (ref >60)
GLUCOSE: 87 mg/dL (ref 70–99)
Potassium: 3.9 mmol/L (ref 3.5–5.1)
Sodium: 150 mmol/L — ABNORMAL HIGH (ref 135–145)
Total Bilirubin: 0.8 mg/dL (ref 0.3–1.2)
Total Protein: 6.4 g/dL — ABNORMAL LOW (ref 6.5–8.1)

## 2018-04-16 LAB — CBC WITH DIFFERENTIAL/PLATELET
Abs Immature Granulocytes: 0.23 10*3/uL — ABNORMAL HIGH (ref 0.00–0.07)
Basophils Absolute: 0 10*3/uL (ref 0.0–0.1)
Basophils Relative: 0 %
Eosinophils Absolute: 0 10*3/uL (ref 0.0–0.5)
Eosinophils Relative: 0 %
HCT: 27.8 % — ABNORMAL LOW (ref 39.0–52.0)
HEMOGLOBIN: 8.6 g/dL — AB (ref 13.0–17.0)
Immature Granulocytes: 2 %
LYMPHS ABS: 0.5 10*3/uL — AB (ref 0.7–4.0)
Lymphocytes Relative: 3 %
MCH: 29.4 pg (ref 26.0–34.0)
MCHC: 30.9 g/dL (ref 30.0–36.0)
MCV: 94.9 fL (ref 80.0–100.0)
Monocytes Absolute: 0.6 10*3/uL (ref 0.1–1.0)
Monocytes Relative: 4 %
Neutro Abs: 14.4 10*3/uL — ABNORMAL HIGH (ref 1.7–7.7)
Neutrophils Relative %: 91 %
Platelets: 308 10*3/uL (ref 150–400)
RBC: 2.93 MIL/uL — ABNORMAL LOW (ref 4.22–5.81)
RDW: 15.1 % (ref 11.5–15.5)
WBC: 15.7 10*3/uL — ABNORMAL HIGH (ref 4.0–10.5)
nRBC: 0 % (ref 0.0–0.2)

## 2018-04-16 LAB — MAGNESIUM: Magnesium: 2.7 mg/dL — ABNORMAL HIGH (ref 1.7–2.4)

## 2018-04-16 LAB — GLUCOSE, CAPILLARY: Glucose-Capillary: 76 mg/dL (ref 70–99)

## 2018-04-16 LAB — TYPE AND SCREEN
ABO/RH(D): A POS
ABO/RH(D): A POS
Antibody Screen: NEGATIVE
Antibody Screen: NEGATIVE

## 2018-04-16 MED ORDER — METRONIDAZOLE IN NACL 5-0.79 MG/ML-% IV SOLN
500.0000 mg | Freq: Once | INTRAVENOUS | Status: AC
  Administered 2018-04-17: 500 mg via INTRAVENOUS

## 2018-04-16 MED ORDER — DEXTROSE 5 % IV SOLN
INTRAVENOUS | Status: DC
  Administered 2018-04-16 – 2018-04-17 (×2): via INTRAVENOUS

## 2018-04-16 NOTE — Consult Note (Signed)
Consultation Note Date: 04/16/2018   Patient Name: Miguel Gamble  DOB: Jul 21, 1950  MRN: [id number]  Age / Sex: 68 y.o., male  PCP: Wenda Low, MD Referring Physician: Aileen Fass, Tammi Klippel, MD  Reason for Consultation: Establishing goals of care and Psychosocial/spiritual support  HPI/Patient Profile: 68 y.o. male   admitted on 04/14/2018 a reported 90-monthhistory of anorexia and weight loss.  He stimates a 30 pound weight loss.   Noncontrast CTs of the chest, abdomen, and pelvis revealed a complex mass in the lower abdomen and pelvis which appears to arise from the sigmoid colon with fistula formation between multiple loops of small bowel.   Marked diffuse dilatation of the small bowel and large bowel loops consistent with a partial obstruction.   Extensive retroperitoneal, mesenteric, and pelvic lymphadenopathy.  There is an enlarged precarinal node.   Chronic left hydronephrosis.   Multiple masses are noted in the bladder.  Multiple hepatic metastases.   Patchy airspace opacity secondary to metastases versus pneumonia.  He was admitted and Dr. BAlinda Moneywas consulted.  He underwent cystoscopy and placement of bilateral ureter stents yesterday.  He was noted to have obstruction of both ureters.  Tumors were resected from the right lateral and posterior bladder.  He underwent placement of an NG tube yesterday.  Dr SBenay Spicewith oncology has met with the patient. For surgery in the monring for diverting colostomy  Patient faces treatment option decisions, advanced directive decisions and anticipatory care needs.   Clinical Assessment and Goals of Care:   This NP MWadie Lessenreviewed medical records, received report from team, assessed the patient and then meet at the patient's bedside along with his wife/Nancy KEdison Pace to introduce the role of palliative medicine into a holistic treatment plan  A   discussion was had today regarding advanced directives and the importance of documentation of healthcare power of attorney and living well.   Created space and opportunity for patient and his wife to explore thoughts and feelings regarding current medical situation.  Values and goals of care important to patient and family were attempted to be elicited.   Discussed contemplation of the "what ifs"   MOST form introduced   Questions and concerns addressed.   Family encouraged to call with questions or concerns.    PMT will continue to support holistically.   NEXT OF KIN    SUMMARY OF RECOMMENDATIONS    Code Status/Advance Care Planning:  Full code   Palliative Prophylaxis:   Aspiration, Bowel Regimen, Delirium Protocol, Frequent Pain Assessment and Oral Care  Additional Recommendations (Limitations, Scope, Preferences):  Full Scope Treatment   Patient is open to all offered and available medical interventions to prolong life.  It is hopeful that once stable will seek oncology care as an outpatient under Dr. SGearldine Shownguidance.  Psycho-social/Spiritual:   Desire for further Chaplaincy support:no  Additional Recommendations: Emotional support offered  Prognosis:   Unable to determine  Discharge Planning: To Be Determined      Primary Diagnoses: Present on Admission: .  Partial bowel obstruction (Early) . Normocytic anemia . Mass of urinary bladder . Pulmonary nodules . Colonic mass . Entero-colonic fistula . Entero-enteric fistula . CAP (community acquired pneumonia)   I have reviewed the medical record, interviewed the patient and family, and examined the patient. The following aspects are pertinent.  History reviewed. No pertinent past medical history. Social History   Socioeconomic History  . Marital status: Married    Spouse name: Not on file  . Number of children: Not on file  . Years of education: Not on file  . Highest education level: Not on  file  Occupational History  . Not on file  Social Needs  . Financial resource strain: Not on file  . Food insecurity:    Worry: Not on file    Inability: Not on file  . Transportation needs:    Medical: Not on file    Non-medical: Not on file  Tobacco Use  . Smoking status: Never Smoker  . Smokeless tobacco: Never Used  Substance and Sexual Activity  . Alcohol use: Not on file  . Drug use: Not on file  . Sexual activity: Not on file  Lifestyle  . Physical activity:    Days per week: Not on file    Minutes per session: Not on file  . Stress: Not on file  Relationships  . Social connections:    Talks on phone: Not on file    Gets together: Not on file    Attends religious service: Not on file    Active member of club or organization: Not on file    Attends meetings of clubs or organizations: Not on file    Relationship status: Not on file  Other Topics Concern  . Not on file  Social History Narrative  . Not on file   Family History  Problem Relation Age of Onset  . Lung cancer Mother   . Emphysema Father   . Heart failure Father    Scheduled Meds: Continuous Infusions: . sodium chloride 10 mL/hr at 04/16/18 0600  . azithromycin Stopped (04/16/18 0008)  . cefTRIAXone (ROCEPHIN)  IV Stopped (04/15/18 2247)  . dextrose    . [START ON 04/17/2018] metronidazole     PRN Meds:.sodium chloride, acetaminophen **OR** acetaminophen, LORazepam, morphine injection, ondansetron **OR** ondansetron (ZOFRAN) IV Medications Prior to Admission:  Prior to Admission medications   Medication Sig Start Date End Date Taking? Authorizing Provider  aspirin 325 MG EC tablet Take 325 mg by mouth every 6 (six) hours as needed for pain.   Yes [provider]   Allergies  Allergen Reactions  . Other Rash    Wheat straw   Review of Systems  Neurological: Positive for weakness.    Physical Exam Constitutional:      Appearance: He is cachectic. He is ill-appearing.    Cardiovascular:     Rate and Rhythm: Tachycardia present.  Abdominal:     General: Bowel sounds are decreased. There is distension.     Tenderness: There is generalized abdominal tenderness.  Skin:    General: Skin is warm and dry.  Neurological:     Mental Status: He is alert.     Vital Signs: BP (!) 145/84 (BP Location: Right Arm)   Pulse (!) 103   Temp 98.6 F (37 C) (Oral)   Resp 16   Ht _0  (1.727 m)   Wt 59.7 kg   SpO2 96%   BMI 20.01 kg/m  Pain Scale: 0-10  Pain Score: 0-No pain   SpO2: SpO2: 96 % O2 Device:SpO2: 96 % O2 Flow Rate: .O2 Flow Rate (L/min): 2 L/min  IO: Intake/output summary:   Intake/Output Summary (Last 24 hours) at 04/16/2018 1234 Last data filed at 04/16/2018 0941 Gross per 24 hour  Intake 2162.64 ml  Output 1650 ml  Net 512.64 ml    LBM: Last BM Date: 04/15/18 Baseline Weight: Weight: 57.2 kg Most recent weight: Weight: 59.7 kg     Palliative Assessment/Data:  30%   Flowsheet Rows     Most Recent Value  Intake Tab  Referral Department  Hospitalist  Unit at Time of Referral  Oncology Unit  Palliative Care Primary Diagnosis  Cancer  Date Notified  04/15/18  Palliative Care Type  New Palliative care  Reason for referral  Clarify Goals of Care  Date of Admission  04/14/18  # of days IP prior to Palliative referral  1  Clinical Assessment  Psychosocial & Spiritual Assessment  Palliative Care Outcomes     Discussed with Dr Venetia Constable  Time In: 1500 Time Out: 1600 Time Total: 60  minutes Greater than 50%  of this time was spent counseling and coordinating care related to the above assessment and plan.  Signed by: Wadie Lessen, NP   Please contact Palliative Medicine Team phone at [phone] for questions and concerns.  For individual provider: See Shea Evans

## 2018-04-16 NOTE — Progress Notes (Signed)
Patient ID: Miguel Gamble, male   DOB: 11-05-1950, 68 y.o.   MRN: [id number]  1 Day Post-Op Subjective: Pt s/p TURBT and bilateral ureteral stent placement yesterday.  Complains of expected urgency and frequency of urination.  Mild hematuria as expected.  Objective: Vital signs in last 24 hours: Temp:  [97.6 F (36.4 C)-98.8 F (37.1 C)] 98.6 F (37 C) (01/01 0403) Pulse Rate:  [93-105] 103 (01/01 0403) Resp:  [16-25] 16 (01/01 0403) BP: (134-153)/(78-91) 145/84 (01/01 0403) SpO2:  [92 %-100 %] 96 % (01/01 0403) Weight:  [59.7 kg] 59.7 kg (12/31 1532)  Intake/Output from previous day: 12/31 0701 - 01/01 0700 In: 2162.6 [I.V.:912.6; IV Piggyback:450] Out: 1400 [Urine:1400] Intake/Output this shift: Total I/O In: -  Out: 500 [Urine:50; Emesis/NG output:450]  Physical Exam:  General: Alert and oriented Abdomen: Soft, Mild distention, NG in place   Lab Results: Recent Labs    04/14/18 1801 04/15/18 0603 04/16/18 0418  HGB 8.9* 7.5* 8.6*  HCT 29.3* 23.7* 27.8*   CBC Latest Ref Rng & Units 04/16/2018 04/15/2018 04/14/2018  WBC 4.0 - 10.5 K/uL 15.7(H) 16.1(H) 20.3(H)  Hemoglobin 13.0 - 17.0 g/dL 8.6(L) 7.5(L) 8.9(L)  Hematocrit 39.0 - 52.0 % 27.8(L) 23.7(L) 29.3(L)  Platelets 150 - 400 K/uL 308 311 315     BMET Recent Labs    04/15/18 0603 04/16/18 0418  NA 145 150*  K 3.2* 3.9  CL 115* 120*  CO2 17* 16*  GLUCOSE 99 87  BUN 76* 61*  CREATININE 2.24* 1.98*  CALCIUM 8.3* 8.7*     Studies/Results:   Assessment/Plan: 1) AKI with ureteral obstruction: S/P bilateral ureteral stent placement. Renal function improving.  Continue to follow renal function.  Will likely need chronic stent management.  2) Pelvic mass with metastatic disease:  Pathology from TURBT pending.  Suspect colon primary.  Dr. Learta Codding has seen patient this morning and systemic therapy will be pending pathology result and management of bowel obstruction.    LOS: 1 day    Jaydi Bray,LES 04/16/2018, 8:25 AM

## 2018-04-16 NOTE — Progress Notes (Signed)
1 Day Post-Op   Subjective/Chief Complaint: Got an NGT and has had quite a bit of output.  Denies pain other than back of throat.     Objective: Vital signs in last 24 hours: Temp:  [97.6 F (36.4 C)-98.8 F (37.1 C)] 98.6 F (37 C) (01/01 0403) Pulse Rate:  [93-105] 103 (01/01 0403) Resp:  [16-25] 16 (01/01 0403) BP: (134-153)/(78-91) 145/84 (01/01 0403) SpO2:  [92 %-100 %] 96 % (01/01 0403) Weight:  [59.7 kg] 59.7 kg (12/31 1532) Last BM Date: 04/15/18  Intake/Output from previous day: 12/31 0701 - 01/01 0700 In: 2162.6 [I.V.:912.6; IV Piggyback:450] Out: 1400 [Urine:1400] Intake/Output this shift: Total I/O In: -  Out: 500 [Urine:50; Emesis/NG output:450]  General appearance: alert, cooperative and mild distress Resp: breathing comfortably GI: soft, distended, though less so than before.  non tender. Extremities: extremities normal, atraumatic, no cyanosis or edema  Lab Results:  Recent Labs    04/15/18 0603 04/16/18 0418  WBC 16.1* 15.7*  HGB 7.5* 8.6*  HCT 23.7* 27.8*  PLT 311 308   BMET Recent Labs    04/15/18 0603 04/16/18 0418  NA 145 150*  K 3.2* 3.9  CL 115* 120*  CO2 17* 16*  GLUCOSE 99 87  BUN 76* 61*  CREATININE 2.24* 1.98*  CALCIUM 8.3* 8.7*   PT/INR No results for input(s): LABPROT, INR in the last 72 hours. ABG No results for input(s): PHART, HCO3 in the last 72 hours.  Invalid input(s): PCO2, PO2  Studies/Results: Ct Abdomen Pelvis Wo Contrast  Result Date: 04/14/2018 CLINICAL DATA:  Chronic weight loss. Abnormal radiograph. Further evaluation requested. EXAM: CT CHEST, ABDOMEN AND PELVIS WITHOUT CONTRAST TECHNIQUE: Multidetector CT imaging of the chest, abdomen and pelvis was performed following the standard protocol without IV contrast. COMPARISON:  None. FINDINGS: CT CHEST FINDINGS Cardiovascular: The heart is normal in size. Mild calcification is noted at the aortic arch. The great vessels are unremarkable in appearance.  Mediastinum/Nodes: The esophagus is filled with contrast, which may reflect gastroesophageal reflux or esophageal dysmotility. Wall thickening along the esophagus may reflect esophagitis or possibly metastatic disease. Mild achalasia cannot be excluded. Mild coronary artery calcification is seen. An enlarged 1.8 cm precarinal node is suspicious for metastatic disease. A trace pericardial effusion is noted. The visualized portions of the thyroid gland are unremarkable. No axillary lymphadenopathy is seen. Lungs/Pleura: Patchy bilateral airspace opacities are noted, predominantly at the right upper lobe and left lower lobe, concerning for pneumonia. Lymphangitic spread of tumor could conceivably have a similar appearance. Multiple large pulmonary nodules are noted bilaterally, measuring up to 1.7 cm in size, compatible with metastatic disease. No pleural effusion or pneumothorax is seen. Musculoskeletal: No acute osseous abnormalities are identified. The visualized musculature is unremarkable in appearance. CT ABDOMEN PELVIS FINDINGS Hepatobiliary: Vague lesions within the liver measure up to 3.5 cm in size, compatible with metastatic disease. The gallbladder is not definitely characterized. The common bile duct is normal in caliber. Pancreas: The pancreas is within normal limits. Peripancreatic nodes measure up to 1.4 cm in short axis, concerning for metastatic disease. Spleen: The spleen is diminutive and unremarkable in appearance. Adrenals/Urinary Tract: The adrenal glands are grossly unremarkable in appearance. Scattered bilateral renal cysts are noted, more prominent on the right. There is mild chronic left-sided hydronephrosis, with prominence of the proximal left ureter to the level of the lower abdomen, reflecting obstruction by the large lower abdominal and pelvic mass. No renal or ureteral stones are identified. Mild left-sided perinephric stranding  is noted. Stomach/Bowel: There is diffuse dilatation of  small-bowel loops to 6.0 cm in diameter, and distention of the cecum to 12.0 cm in diameter. The small and large bowel are diffusely dilated. The stomach is largely filled with contrast. Distention extends to the level of the proximal sigmoid colon. 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 Wall thickening is noted along the adjacent small and large bowel loops, and the mass measures approximately 16 x 9 cm. Wall thickening extends to the rectum, with diffuse presacral stranding. This most likely arises from the sigmoid colon, though a small bowel or bladder source is also possible. Vascular/Lymphatic: Scattered calcification is seen along the abdominal aorta and its branches. The abdominal aorta is otherwise grossly unremarkable. The inferior vena cava is grossly unremarkable. Scattered enlarged mesenteric and retroperitoneal nodes are seen, measuring up to 1.5 cm in short axis. Vague mass tracks directly anterior to the aortic bifurcation and along the pelvic sidewall bilaterally. A 1.9 cm node is seen along the left external iliac vessels. Reproductive: Multiple masses are noted within the bladder, with diffuse irregular bladder wall thickening at the dome of the bladder, reflecting diffuse spread of disease. The prostate is borderline enlarged, measuring 4.9 cm in transverse dimension. Other: Trace fluid is noted at a small right inguinal hernia. Musculoskeletal: No acute osseous abnormalities are identified. The visualized musculature is unremarkable in appearance. IMPRESSION: 1. 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 The mass measures 16 x 9 cm. Wall thickening  extends along the adjacent small and large bowel loops. 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 2. Marked diffuse dilatation of the small and large bowel loops, reflecting partial bowel obstruction by the mass. 3. Extensive retroperitoneal, mesenteric and pelvic sidewall metastatic lymphadenopathy noted. Enlarged mediastinal node also noted, concerning for metastatic disease. 4. Multiple pulmonary nodules noted bilaterally, compatible with metastatic disease. 5. Wall thickening along the esophagus may reflect esophagitis or possibly metastatic disease. Esophagus filled with contrast, possibly reflecting gastroesophageal reflux or esophageal dysmotility. Mild achalasia cannot be excluded. 6. Mild chronic left-sided hydronephrosis, reflecting obstruction by the large lower abdominal and pelvic mass. 7. Multiple masses within the bladder, with diffuse irregular bladder wall thickening at the dome of the bladder, reflecting diffuse spread of malignancy. 8. Scattered hepatic metastases noted. 9. Patchy bilateral airspace opacities, predominantly at the right upper lobe and left lower lobe, concerning for pneumonia. Lymphangitic spread of tumor could conceivably have a similar appearance. 10. Bilateral renal cysts. 11. Trace pericardial effusion noted. Aortic Atherosclerosis (ICD10-I70.0). These results were called by telephone at the time of interpretation on 04/14/2018 at 9:41 pm to [facility] PA, who verbally acknowledged these results. Electronically Signed   By: Garald Balding M.D.   On: 04/14/2018 21:48   Dg Abd 1 View  Result Date: 04/15/2018 CLINICAL DATA:  Encounter for nasogastric tube placement. EXAM: ABDOMEN - 1 VIEW COMPARISON:  CT abdomen pelvis of April 14, 2018 FINDINGS: Multiple dilated small bowel loops are identified throughout abdomen and pelvis. Nasogastric tube is identified with distal tip in the  proximal  stomach. IMPRESSION: Nasogastric tube is identified with distal tip in the proximal stomach. Multiple dilated small bowel loops are identified throughout abdomen and pelvis as seen on prior recent CT. Electronically Signed   By: Abelardo Diesel M.D.   On: 04/15/2018 14:31   Ct Chest Wo Contrast  Result Date: 04/14/2018 CLINICAL DATA:  Chronic weight loss. Abnormal radiograph. Further evaluation requested. EXAM: CT CHEST, ABDOMEN AND PELVIS WITHOUT CONTRAST TECHNIQUE: Multidetector CT imaging of the chest, abdomen and pelvis was performed following the standard protocol without IV contrast. COMPARISON:  None. FINDINGS: CT CHEST FINDINGS Cardiovascular: The heart is normal in size. Mild calcification is noted at the aortic arch. The great vessels are unremarkable in appearance. Mediastinum/Nodes: The esophagus is filled with contrast, which may reflect gastroesophageal reflux or esophageal dysmotility. Wall thickening along the esophagus may reflect esophagitis or possibly metastatic disease. Mild achalasia cannot be excluded. Mild coronary artery calcification is seen. An enlarged 1.8 cm precarinal node is suspicious for metastatic disease. A trace pericardial effusion is noted. The visualized portions of the thyroid gland are unremarkable. No axillary lymphadenopathy is seen. Lungs/Pleura: Patchy bilateral airspace opacities are noted, predominantly at the right upper lobe and left lower lobe, concerning for pneumonia. Lymphangitic spread of tumor could conceivably have a similar appearance. Multiple large pulmonary nodules are noted bilaterally, measuring up to 1.7 cm in size, compatible with metastatic disease. No pleural effusion or pneumothorax is seen. Musculoskeletal: No acute osseous abnormalities are identified. The visualized musculature is unremarkable in appearance. CT ABDOMEN PELVIS FINDINGS Hepatobiliary: Vague lesions within the liver measure up to 3.5 cm in size, compatible with metastatic disease.  The gallbladder is not definitely characterized. The common bile duct is normal in caliber. Pancreas: The pancreas is within normal limits. Peripancreatic nodes measure up to 1.4 cm in short axis, concerning for metastatic disease. Spleen: The spleen is diminutive and unremarkable in appearance. Adrenals/Urinary Tract: The adrenal glands are grossly unremarkable in appearance. Scattered bilateral renal cysts are noted, more prominent on the right. There is mild chronic left-sided hydronephrosis, with prominence of the proximal left ureter to the level of the lower abdomen, reflecting obstruction by the large lower abdominal and pelvic mass. No renal or ureteral stones are identified. Mild left-sided perinephric stranding is noted. Stomach/Bowel: There is diffuse dilatation of small-bowel loops to 6.0 cm in diameter, and distention of the cecum to 12.0 cm in diameter. The small and large bowel are diffusely dilated. The stomach is largely filled with contrast. Distention extends to the level of the proximal sigmoid colon. 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 Wall thickening is noted along the adjacent small and large bowel loops, and the mass measures approximately 16 x 9 cm. Wall thickening extends to the rectum, with diffuse presacral stranding. This most likely arises from the sigmoid colon, though a small bowel or bladder source is also possible. Vascular/Lymphatic: Scattered calcification is seen along the abdominal aorta and its branches. The abdominal aorta is otherwise grossly unremarkable. The inferior vena cava is grossly unremarkable. Scattered enlarged mesenteric and retroperitoneal nodes are seen, measuring up to 1.5 cm in short axis. Vague mass tracks directly anterior to the aortic bifurcation and along the pelvic  sidewall bilaterally. A 1.9 cm node is seen along the left external iliac vessels. Reproductive:  Multiple masses are noted within the bladder, with diffuse irregular bladder wall thickening at the dome of the bladder, reflecting diffuse spread of disease. The prostate is borderline enlarged, measuring 4.9 cm in transverse dimension. Other: Trace fluid is noted at a small right inguinal hernia. Musculoskeletal: No acute osseous abnormalities are identified. The visualized musculature is unremarkable in appearance. IMPRESSION: 1. 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 The mass measures 16 x 9 cm. Wall thickening extends along the adjacent small and large bowel loops. 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 2. Marked diffuse dilatation of the small and large bowel loops, reflecting partial bowel obstruction by the mass. 3. Extensive retroperitoneal, mesenteric and pelvic sidewall metastatic lymphadenopathy noted. Enlarged mediastinal node also noted, concerning for metastatic disease. 4. Multiple pulmonary nodules noted bilaterally, compatible with metastatic disease. 5. Wall thickening along the esophagus may reflect esophagitis or possibly metastatic disease. Esophagus filled with contrast, possibly reflecting gastroesophageal reflux or esophageal dysmotility. Mild achalasia cannot be excluded. 6. Mild chronic left-sided hydronephrosis, reflecting obstruction by the large lower abdominal and pelvic mass. 7. Multiple masses within the bladder, with diffuse irregular bladder wall thickening at the dome of the bladder, reflecting diffuse spread of malignancy. 8. Scattered hepatic metastases noted. 9. Patchy bilateral airspace opacities, predominantly at the right upper  lobe and left lower lobe, concerning for pneumonia. Lymphangitic spread of tumor could conceivably have a similar appearance. 10. Bilateral renal cysts. 11. Trace pericardial effusion noted. Aortic Atherosclerosis (ICD10-I70.0). These results were called by telephone at the time of interpretation on 04/14/2018 at 9:41 pm to [facility] PA, who verbally acknowledged these results. Electronically Signed   By: Garald Balding M.D.   On: 04/14/2018 21:48   Dg C-arm 1-60 Min-no Report  Result Date: 04/15/2018 Fluoroscopy was utilized by the requesting physician.  No radiographic interpretation.    Anti-infectives: Anti-infectives (From admission, onward)   Start     Dose/Rate Route Frequency Ordered Stop   04/15/18 1030  ceFAZolin (ANCEF) IVPB 2g/100 mL premix     2 g 200 mL/hr over 30 Minutes Intravenous On call to O.R. 04/15/18 1018 04/15/18 1647   04/14/18 2230  cefTRIAXone (ROCEPHIN) 1 g in sodium chloride 0.9 % 100 mL IVPB     1 g 200 mL/hr over 30 Minutes Intravenous Daily at bedtime 04/14/18 2218 04/21/18 2159   04/14/18 2230  azithromycin (ZITHROMAX) 500 mg in sodium chloride 0.9 % 250 mL IVPB     500 mg 250 mL/hr over 60 Minutes Intravenous Daily at bedtime 04/14/18 2218 04/21/18 2159      Assessment/Plan: s/p Procedure(s): CYSTOSCOPY BILATERAL WITH RETROGRADE PYELOGRAM/URETERAL BILATERAL STENT PLACEMENT WITH TRANSURETHRAL RESECTION OF BLADDER TUMOR (Bilateral)  Partial large bowel obstruction Pelvic mass consistent with bladder cancer or sigmoid colon cancer and possible fistulization to small bowel.   Chronic blood loss anemia Hematuria Dehydration Acute kidney injury vs chronic kidney disease Right hydronephrosis Bladder masses Metastatic cancer likely given adenopathy seen in retroperitoneal, mesenteric, and pelvic location.   Hepatic masses Pulmonary masses/airspace disease.   Dilated esophagus   GI reviewed and mass not amenable to stenting.   Creatinine  improving. Dr. Alinda Money stented both ureters.   Hypernatremia - defer to medicine. NGT has decompressed the small bowel  a bit.    Will post for open diverting colostomy tomorrow with Dr. Ninfa Linden. Tentatively posted for around 10 AM, but this time may change.    No bowel prep given obstruction.   Will give perioperative antibiotics. Reviewed risks of surgery with patient including bleeding, infection, damage to adjacent structures, heart/lung issues, blood clot, ostomy issues, wound problems including hernia, death.      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     LOS: 1 day    Stark Klein 04/16/2018

## 2018-04-16 NOTE — Progress Notes (Signed)
-  Patient not seen.  Chart reviewed. - Cystoscopy yesterday showed multiple tumors in the bladder- status post resection.  Pathology pending. -He is scheduled for open diverting colostomy tomorrow. -GI will follow from distance.  Otis Brace MD, [place] 04/16/2018, 1:15 PM  Contact #  [phone]

## 2018-04-16 NOTE — Progress Notes (Signed)
[facility] PROGRESS NOTE    Progress Note  Miguel Gamble  QQV:[id number] DOB: 29-Jul-1950 DOA: 04/14/2018 PCP: Wenda Low, MD     Brief Narrative:   Miguel Gamble is an 68 y.o. 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  Assessment/Plan:   Intra-abdominal mass with partial Partial bowel obstruction [facility]) Neurosurgery, GI and urology were consulted. Allergy was also consulted. Patient is status post cystoscopy with bilateral retrograde pyelogram with bilateral stent placement and transurethral resection of bladder tumor Diet per urology. 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 Surgery recommended NG tube for small bowel decompression and open diverting colostomy. CEA pending pathology report from 04/15/2018 is pending.  We will follow-up on results.  Pelvic mass with metastases and hydronephrosis leading to acute renal failure: Creatinine on admission 2.4. Most likely postobstructive. Urology was consulted who performed cystoscopy with bilateral stent placement creatinine is slowly improving we will continue IV fluids but will change him to D5W check a basic metabolic panel in the morning.  Leukocytosis: Likely due to above has remained afebrile.  Probable community-acquired pneumonia: Lung nodules on CT versus lymphangitic spread Culture data is pending. He was started empirically on Rocephin and azithromycin urine antigens are negative.  Normocytic anemia HemoGlobin today is 8.6.   Abdominal mass   DVT prophylaxis: lovenxo Family Communication:wife Disposition Plan/Barrier to D/C: Once able to tolerate orals. Code Status:      Code Status Orders  (From admission, onward)         Start     Ordered   04/14/18 2216  Full code  Continuous     04/14/18 2218        Code Status History    This patient has a current code status but no historical code status.        IV Access:    Peripheral IV   Procedures and diagnostic studies:   Ct Abdomen Pelvis Wo Contrast  Result Date: 04/14/2018 CLINICAL DATA:  Chronic weight loss. Abnormal radiograph. Further evaluation requested. EXAM: CT CHEST, ABDOMEN AND PELVIS WITHOUT CONTRAST TECHNIQUE: Multidetector CT imaging of the chest, abdomen and pelvis was performed following the standard protocol without IV contrast. COMPARISON:  None. FINDINGS: CT CHEST FINDINGS Cardiovascular: The heart is normal in size. Mild calcification is noted at the aortic arch. The great vessels are unremarkable in appearance. Mediastinum/Nodes: The esophagus is filled with contrast, which may reflect gastroesophageal reflux or esophageal dysmotility. Wall thickening along the esophagus may reflect esophagitis or possibly metastatic disease. Mild achalasia cannot be excluded. Mild coronary artery calcification is seen. An enlarged 1.8 cm precarinal node is suspicious for metastatic disease. A trace pericardial effusion is noted. The visualized portions of the thyroid gland are unremarkable. No axillary lymphadenopathy is seen. Lungs/Pleura: Patchy bilateral airspace opacities are noted, predominantly at the right upper lobe and left lower lobe, concerning for pneumonia. Lymphangitic spread of tumor could conceivably have a similar appearance. Multiple large pulmonary nodules are noted bilaterally, measuring up to 1.7 cm in size, compatible with metastatic disease. No pleural effusion or pneumothorax is seen. Musculoskeletal: No acute osseous abnormalities are identified. The visualized musculature is unremarkable in appearance. CT ABDOMEN PELVIS FINDINGS Hepatobiliary: Vague lesions  within the  liver measure up to 3.5 cm in size, compatible with metastatic disease. The gallbladder is not definitely characterized. The common bile duct is normal in caliber. Pancreas: The pancreas is within normal limits. Peripancreatic nodes measure up to 1.4 cm in short axis, concerning for metastatic disease. Spleen: The spleen is diminutive and unremarkable in appearance. Adrenals/Urinary Tract: The adrenal glands are grossly unremarkable in appearance. Scattered bilateral renal cysts are noted, more prominent on the right. There is mild chronic left-sided hydronephrosis, with prominence of the proximal left ureter to the level of the lower abdomen, reflecting obstruction by the large lower abdominal and pelvic mass. No renal or ureteral stones are identified. Mild left-sided perinephric stranding is noted. Stomach/Bowel: There is diffuse dilatation of small-bowel loops to 6.0 cm in diameter, and distention of the cecum to 12.0 cm in diameter. The small and large bowel are diffusely dilated. The stomach is largely filled with contrast. Distention extends to the level of the proximal sigmoid colon. 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 Wall thickening is noted along the adjacent small and large bowel loops, and the mass measures approximately 16 x 9 cm. Wall thickening extends to the rectum, with diffuse presacral stranding. This most likely arises from the sigmoid colon, though a small bowel or bladder source is also possible. Vascular/Lymphatic: Scattered calcification is seen along the abdominal aorta and its branches. The abdominal aorta is otherwise grossly unremarkable. The inferior vena cava is grossly unremarkable. Scattered enlarged mesenteric and retroperitoneal nodes are seen, measuring up to 1.5 cm in short axis. Vague mass tracks  directly anterior to the aortic bifurcation and along the pelvic sidewall bilaterally. A 1.9 cm node is seen along the left external iliac vessels. Reproductive: Multiple masses are noted within the bladder, with diffuse irregular bladder wall thickening at the dome of the bladder, reflecting diffuse spread of disease. The prostate is borderline enlarged, measuring 4.9 cm in transverse dimension. Other: Trace fluid is noted at a small right inguinal hernia. Musculoskeletal: No acute osseous abnormalities are identified. The visualized musculature is unremarkable in appearance. IMPRESSION: 1. 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 The mass measures 16 x 9 cm. Wall thickening extends along the adjacent small and large bowel loops. 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 2. Marked diffuse dilatation of the small and large bowel loops, reflecting partial bowel obstruction by the mass. 3. Extensive retroperitoneal, mesenteric and pelvic sidewall metastatic lymphadenopathy noted. Enlarged mediastinal node also noted, concerning for metastatic disease. 4. Multiple pulmonary nodules noted bilaterally, compatible with metastatic disease. 5. Wall thickening along the esophagus may reflect esophagitis or possibly metastatic disease. Esophagus filled with contrast, possibly reflecting gastroesophageal reflux or esophageal dysmotility. Mild achalasia cannot be excluded. 6. Mild chronic left-sided hydronephrosis, reflecting obstruction by the large lower abdominal and pelvic mass. 7. Multiple masses within the bladder, with diffuse irregular bladder wall thickening at the dome of the bladder, reflecting diffuse spread of malignancy. 8. Scattered hepatic metastases noted. 9. Patchy  bilateral airspace opacities, predominantly at the right upper lobe  and left lower lobe, concerning for pneumonia. Lymphangitic spread of tumor could conceivably have a similar appearance. 10. Bilateral renal cysts. 11. Trace pericardial effusion noted. Aortic Atherosclerosis (ICD10-I70.0). These results were called by telephone at the time of interpretation on 04/14/2018 at 9:41 pm to [facility] PA, who verbally acknowledged these results. Electronically Signed   By: Garald Balding M.D.   On: 04/14/2018 21:48   Dg Abd 1 View  Result Date: 04/15/2018 CLINICAL DATA:  Encounter for nasogastric tube placement. EXAM: ABDOMEN - 1 VIEW COMPARISON:  CT abdomen pelvis of April 14, 2018 FINDINGS: Multiple dilated small bowel loops are identified throughout abdomen and pelvis. Nasogastric tube is identified with distal tip in the proximal stomach. IMPRESSION: Nasogastric tube is identified with distal tip in the proximal stomach. Multiple dilated small bowel loops are identified throughout abdomen and pelvis as seen on prior recent CT. Electronically Signed   By: Abelardo Diesel M.D.   On: 04/15/2018 14:31   Ct Chest Wo Contrast  Result Date: 04/14/2018 CLINICAL DATA:  Chronic weight loss. Abnormal radiograph. Further evaluation requested. EXAM: CT CHEST, ABDOMEN AND PELVIS WITHOUT CONTRAST TECHNIQUE: Multidetector CT imaging of the chest, abdomen and pelvis was performed following the standard protocol without IV contrast. COMPARISON:  None. FINDINGS: CT CHEST FINDINGS Cardiovascular: The heart is normal in size. Mild calcification is noted at the aortic arch. The great vessels are unremarkable in appearance. Mediastinum/Nodes: The esophagus is filled with contrast, which may reflect gastroesophageal reflux or esophageal dysmotility. Wall thickening along the esophagus may reflect esophagitis or possibly metastatic disease. Mild achalasia cannot be excluded. Mild coronary artery calcification is seen. An  enlarged 1.8 cm precarinal node is suspicious for metastatic disease. A trace pericardial effusion is noted. The visualized portions of the thyroid gland are unremarkable. No axillary lymphadenopathy is seen. Lungs/Pleura: Patchy bilateral airspace opacities are noted, predominantly at the right upper lobe and left lower lobe, concerning for pneumonia. Lymphangitic spread of tumor could conceivably have a similar appearance. Multiple large pulmonary nodules are noted bilaterally, measuring up to 1.7 cm in size, compatible with metastatic disease. No pleural effusion or pneumothorax is seen. Musculoskeletal: No acute osseous abnormalities are identified. The visualized musculature is unremarkable in appearance. CT ABDOMEN PELVIS FINDINGS Hepatobiliary: Vague lesions within the liver measure up to 3.5 cm in size, compatible with metastatic disease. The gallbladder is not definitely characterized. The common bile duct is normal in caliber. Pancreas: The pancreas is within normal limits. Peripancreatic nodes measure up to 1.4 cm in short axis, concerning for metastatic disease. Spleen: The spleen is diminutive and unremarkable in appearance. Adrenals/Urinary Tract: The adrenal glands are grossly unremarkable in appearance. Scattered bilateral renal cysts are noted, more prominent on the right. There is mild chronic left-sided hydronephrosis, with prominence of the proximal left ureter to the level of the lower abdomen, reflecting obstruction by the large lower abdominal and pelvic mass. No renal or ureteral stones are identified. Mild left-sided perinephric stranding is noted. Stomach/Bowel: There is diffuse dilatation of small-bowel loops to 6.0 cm in diameter, and distention of the cecum to 12.0 cm in diameter. The small and large bowel are diffusely dilated. The stomach is largely filled with contrast. Distention extends to the level of the proximal sigmoid colon. A very large complex mass is noted at the mid to  lower abdomen and pelvis, which appears to include a fistula between multiple loops of small bowel, the cecum and sigmoid colon, with stool in the upper aspect of the  mass. Wall thickening is noted along the adjacent small and large bowel loops, and the mass measures approximately 16 x 9 cm. Wall thickening extends to the rectum, with diffuse presacral stranding. This most likely arises from the sigmoid colon, though a small bowel or bladder source is also possible. Vascular/Lymphatic: Scattered calcification is seen along the abdominal aorta and its branches. The abdominal aorta is otherwise grossly unremarkable. The inferior vena cava is grossly unremarkable. Scattered enlarged mesenteric and retroperitoneal nodes are seen, measuring up to 1.5 cm in short axis. Vague mass tracks directly anterior to the aortic bifurcation and along the pelvic sidewall bilaterally. A 1.9 cm node is seen along the left external iliac vessels. Reproductive: Multiple masses are noted within the bladder, with diffuse irregular bladder wall thickening at the dome of the bladder, reflecting diffuse spread of disease. The prostate is borderline enlarged, measuring 4.9 cm in transverse dimension. Other: Trace fluid is noted at a small right inguinal hernia. Musculoskeletal: No acute osseous abnormalities are identified. The visualized musculature is unremarkable in appearance. IMPRESSION: 1. 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 The mass measures 16 x 9 cm. Wall thickening extends along the adjacent small and large bowel loops. 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 2. Marked diffuse dilatation of the small and large bowel loops, reflecting partial bowel obstruction by the mass. 3.  Extensive retroperitoneal, mesenteric and pelvic sidewall metastatic lymphadenopathy noted. Enlarged mediastinal node also noted, concerning for metastatic disease. 4. Multiple pulmonary nodules noted bilaterally, compatible with metastatic disease. 5. Wall thickening along the esophagus may reflect esophagitis or possibly metastatic disease. Esophagus filled with contrast, possibly reflecting gastroesophageal reflux or esophageal dysmotility. Mild achalasia cannot be excluded. 6. Mild chronic left-sided hydronephrosis, reflecting obstruction by the large lower abdominal and pelvic mass. 7. Multiple masses within the bladder, with diffuse irregular bladder wall thickening at the dome of the bladder, reflecting diffuse spread of malignancy. 8. Scattered hepatic metastases noted. 9. Patchy bilateral airspace opacities, predominantly at the right upper lobe and left lower lobe, concerning for pneumonia. Lymphangitic spread of tumor could conceivably have a similar appearance. 10. Bilateral renal cysts. 11. Trace pericardial effusion noted. Aortic Atherosclerosis (ICD10-I70.0). These results were called by telephone at the time of interpretation on 04/14/2018 at 9:41 pm to [facility] PA, who verbally acknowledged these results. Electronically Signed   By: Garald Balding M.D.   On: 04/14/2018 21:48   Dg C-arm 1-60 Min-no Report  Result Date: 04/15/2018 Fluoroscopy was utilized by the requesting physician.  No radiographic interpretation.     Medical Consultants:    None.  Anti-Infectives:   Rocephin  Subjective:    [name] great with NG tube in place.  Objective:    Vitals:   04/15/18 1831 04/15/18 1831 04/15/18 2001 04/16/18 0403  BP:  (!) 148/91 (!) 146/86 (!) 145/84  Pulse:  96 97 (!) 103  Resp: 20 20 18 16   Temp:  98 F (36.7 C) 98.4 F (36.9 C) 98.6 F (37 C)  TempSrc:  Oral Oral Oral  SpO2:  94% 100% 96%  Weight:      Height:        Intake/Output Summary (Last  24 hours) at 04/16/2018 1147 Last data filed at 04/16/2018  0539 Gross per 24 hour  Intake 2162.64 ml  Output 1800 ml  Net 362.64 ml   Filed Weights   04/15/18 0038 04/15/18 0523 04/15/18 1532  Weight: 59.6 kg 59.7 kg 59.7 kg    Exam: General exam: In no acute distress. Respiratory system: Air movement and clear to auscultation. Cardiovascular system: Rate and rhythm positive S1-S2 Gastrointestinal system: Bowel sounds soft nontender nondistended Central nervous system: Alert and oriented. No focal neurological deficits. Extremities: No pedal edema. Skin: No rashes, lesions or ulcers Psychiatry: Judgement and insight appear normal. Mood & affect appropriate.    Data Reviewed:    Labs: Basic Metabolic Panel: Recent Labs  Lab 04/14/18 1801 04/15/18 0603 04/16/18 0418  NA 144 145 150*  K 4.0 3.2* 3.9  CL 111 115* 120*  CO2 18* 17* 16*  GLUCOSE 125* 99 87  BUN 75* 76* 61*  CREATININE 2.83* 2.24* 1.98*  CALCIUM 9.0 8.3* 8.7*  MG  --   --  2.7*   GFR Estimated Creatinine Clearance: 30.2 mL/min (A) (by C-G formula based on SCr of 1.98 mg/dL (H)). Liver Function Tests: Recent Labs  Lab 04/14/18 1801 04/16/18 0418  AST 47* 30  ALT 26 24  ALKPHOS 83 72  BILITOT 1.0 0.8  PROT 7.3 6.4*  ALBUMIN 3.4* 2.7*   Recent Labs  Lab 04/14/18 1801  LIPASE 23   No results for input(s): AMMONIA in the last 168 hours. Coagulation profile No results for input(s): INR, PROTIME in the last 168 hours.  CBC: Recent Labs  Lab 04/14/18 1801 04/15/18 0603 04/16/18 0418  WBC 20.3* 16.1* 15.7*  NEUTROABS  --  14.6* 14.4*  HGB 8.9* 7.5* 8.6*  HCT 29.3* 23.7* 27.8*  MCV 96.1 90.5 94.9  PLT 315 311 308   Cardiac Enzymes: No results for input(s): CKTOTAL, CKMB, CKMBINDEX, TROPONINI in the last 168 hours. BNP (last 3 results) No results for input(s): PROBNP in the last 8760 hours. CBG: Recent Labs  Lab 04/15/18 0952 04/16/18 0759  GLUCAP 85 76   D-Dimer: No results for  input(s): DDIMER in the last 72 hours. Hgb A1c: No results for input(s): HGBA1C in the last 72 hours. Lipid Profile: No results for input(s): CHOL, HDL, LDLCALC, TRIG, CHOLHDL, LDLDIRECT in the last 72 hours. Thyroid function studies: No results for input(s): TSH, T4TOTAL, T3FREE, THYROIDAB in the last 72 hours.  Invalid input(s): FREET3 Anemia work up: No results for input(s): VITAMINB12, FOLATE, FERRITIN, TIBC, IRON, RETICCTPCT in the last 72 hours. Sepsis Labs: Recent Labs  Lab 04/14/18 1801 04/14/18 1808 04/14/18 2009 04/15/18 0603 04/16/18 0418  WBC 20.3*  --   --  16.1* 15.7*  LATICACIDVEN  --  3.65* 1.86  --   --    Microbiology No results found for this or any previous visit (from the past 240 hour(s)).   Medications:    Continuous Infusions: . sodium chloride 10 mL/hr at 04/16/18 0600  . azithromycin Stopped (04/16/18 0008)  . cefTRIAXone (ROCEPHIN)  IV Stopped (04/15/18 2247)  . [START ON 04/17/2018] metronidazole         LOS: 1 day   Charlynne Cousins  [facility]   *Please refer to [email or web address], password [email or web address] to get updated schedule on who will round on this patient, as hospitalists switch teams weekly. If 7PM-7AM, please contact night-coverage at [email or web address], password [email or web address] for any overnight needs.  04/16/2018, 11:47 AM

## 2018-04-16 NOTE — Consult Note (Signed)
New Hematology/Oncology Consult   Requesting GN:OIBBCW Alekh      Reason for Consult: Bowel obstruction, colon mass  HPI: Miguel Gamble reports a 66-month history of anorexia and weight loss.  He estimates a 30 pound weight loss.  He had an episode of diarrhea and abdominal cramping after eating "garlic "in June.  He has a history of poor tolerance with garlic.  The symptoms resolved and he gained weight for a period of time. He presented to the emergency room 04/14/2018 and was noted to have an elevated creatinine and anemia. Noncontrast CTs of the chest, abdomen, and pelvis revealed a complex mass in the lower abdomen and pelvis which appears to arise from the sigmoid colon with fistula formation between multiple loops of small bowel.  Marked diffuse dilatation of the small bowel and large bowel loops consistent with a partial obstruction.  Extensive retroperitoneal, mesenteric, and pelvic lymphadenopathy.  There is an enlarged precarinal node.  Chronic left hydronephrosis.  Multiple masses are noted in the bladder.  Multiple hepatic metastases.  Patchy airspace opacity secondary to metastases versus pneumonia.  He was admitted and Dr. Alinda Money was consulted.  He underwent cystoscopy and placement of bilateral ureter stents yesterday.  He was noted to have obstruction of both ureters.  Tumors were resected from the right lateral and posterior bladder.  He has been seen in consultation by Dr. Therisa Doyne and Dr. Barry Dienes.  He underwent placement of an NG tube yesterday.  He denies nausea.     Past medical history: 1.  Hemorrhoids  Past Surgical History:  Procedure Laterality Date  . CYSTOSCOPY W/ URETERAL STENT PLACEMENT Bilateral 04/15/2018   Procedure: CYSTOSCOPY BILATERAL WITH RETROGRADE PYELOGRAM/URETERAL BILATERAL STENT PLACEMENT WITH TRANSURETHRAL RESECTION OF BLADDER TUMOR;  Surgeon: Raynelle Bring, MD;  Location: [facility];  Service: Urology;  Laterality: Bilateral;  :   Current  Facility-Administered Medications:  .  0.9 %  sodium chloride infusion, , Intravenous, PRN, Starla Link, Kshitiz, MD, Last Rate: 10 mL/hr at 04/16/18 0600 .  acetaminophen (TYLENOL) tablet 650 mg, 650 mg, Oral, Q6H PRN **OR** acetaminophen (TYLENOL) suppository 650 mg, 650 mg, Rectal, Q6H PRN, Raynelle Bring, MD .  azithromycin (ZITHROMAX) 500 mg in sodium chloride 0.9 % 250 mL IVPB, 500 mg, Intravenous, QHS, Raynelle Bring, MD, Stopped at 04/16/18 0008 .  cefTRIAXone (ROCEPHIN) 1 g in sodium chloride 0.9 % 100 mL IVPB, 1 g, Intravenous, QHS, Raynelle Bring, MD, Stopped at 04/15/18 2247 .  Influenza vac split quadrivalent PF (FLUZONE HIGH-DOSE) injection 0.5 mL, 0.5 mL, Intramuscular, Tomorrow-1000, Raynelle Bring, MD .  LORazepam (ATIVAN) injection 0.5-1 mg, 0.5-1 mg, Intravenous, Q8H PRN, Raynelle Bring, MD .  morphine 2 MG/ML injection 1-3 mg, 1-3 mg, Intravenous, Q4H PRN, Raynelle Bring, MD .  ondansetron [facility]) tablet 4 mg, 4 mg, Oral, Q6H PRN **OR** ondansetron (ZOFRAN) injection 4 mg, 4 mg, Intravenous, Q6H PRN, Raynelle Bring, MD .  pneumococcal 23 valent vaccine (PNU-IMMUNE) injection 0.5 mL, 0.5 mL, Intramuscular, Tomorrow-1000, Raynelle Bring, MD:  . Influenza vac split quadrivalent PF  0.5 mL Intramuscular Tomorrow-1000  . pneumococcal 23 valent vaccine  0.5 mL Intramuscular Tomorrow-1000  :  Allergies  Allergen Reactions  . Other Rash    Wheat straw  :  FH: His mother died of lung cancer and was a smoker.  No other family history of cancer  SOCIAL HISTORY: He lives with his wife in [place].  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  He quit smoking cigarettes approximately 30 years ago.  He reports social alcohol use.  No transfusion history.  No risk factor for HIV or hepatitis.  Review of Systems: Anorexia, 30 pound weight loss, intermittent bleeding from "hemorrhoids ", diarrhea  Positives include:  A complete ROS was otherwise negative.   Physical Exam:  Blood  pressure (!) 145/84, pulse (!) 103, temperature 98.6 F (37 C), temperature source Oral, resp. rate 16, height 5\' 8"  (1.727 m), weight 131 lb 9.8 oz (59.7 kg), SpO2 96 %.  HEENT: NG tube in place, oral cavity without visible mass, neck without mass Lungs: End inspiratory rales at the posterior base bilaterally, no respiratory distress Cardiac: Regular rate and rhythm Abdomen: No hepatomegaly, mildly distended, no mass, nontender, reducible right inguinal hernia Vascular: No leg edema Lymph nodes: No cervical, supraclavicular, axillary, or inguinal nodes Neurologic: Alert and oriented, the motor exam appears intact in the upper and lower extremities bilaterally Skin: No rash Musculoskeletal: No spine tenderness  LABS:  Recent Labs    04/15/18 0603 04/16/18 0418  WBC 16.1* 15.7*  HGB 7.5* 8.6*  HCT 23.7* 27.8*  PLT 311 308    Recent Labs    04/15/18 0603 04/16/18 0418  NA 145 150*  K 3.2* 3.9  CL 115* 120*  CO2 17* 16*  GLUCOSE 99 87  BUN 76* 61*  CREATININE 2.24* 1.98*  CALCIUM 8.3* 8.7*      RADIOLOGY:  Ct Abdomen Pelvis Wo Contrast  Result Date: 04/14/2018 CLINICAL DATA:  Chronic weight loss. Abnormal radiograph. Further evaluation requested. EXAM: CT CHEST, ABDOMEN AND PELVIS WITHOUT CONTRAST TECHNIQUE: Multidetector CT imaging of the chest, abdomen and pelvis was performed following the standard protocol without IV contrast. COMPARISON:  None. FINDINGS: CT CHEST FINDINGS Cardiovascular: The heart is normal in size. Mild calcification is noted at the aortic arch. The great vessels are unremarkable in appearance. Mediastinum/Nodes: The esophagus is filled with contrast, which may reflect gastroesophageal reflux or esophageal dysmotility. Wall thickening along the esophagus may reflect esophagitis or possibly metastatic disease. Mild achalasia cannot be excluded. Mild coronary artery calcification is seen. An enlarged 1.8 cm precarinal node is suspicious for metastatic  disease. A trace pericardial effusion is noted. The visualized portions of the thyroid gland are unremarkable. No axillary lymphadenopathy is seen. Lungs/Pleura: Patchy bilateral airspace opacities are noted, predominantly at the right upper lobe and left lower lobe, concerning for pneumonia. Lymphangitic spread of tumor could conceivably have a similar appearance. Multiple large pulmonary nodules are noted bilaterally, measuring up to 1.7 cm in size, compatible with metastatic disease. No pleural effusion or pneumothorax is seen. Musculoskeletal: No acute osseous abnormalities are identified. The visualized musculature is unremarkable in appearance. CT ABDOMEN PELVIS FINDINGS Hepatobiliary: Vague lesions within the liver measure up to 3.5 cm in size, compatible with metastatic disease. The gallbladder is not definitely characterized. The common bile duct is normal in caliber. Pancreas: The pancreas is within normal limits. Peripancreatic nodes measure up to 1.4 cm in short axis, concerning for metastatic disease. Spleen: The spleen is diminutive and unremarkable in appearance. Adrenals/Urinary Tract: The adrenal glands are grossly unremarkable in appearance. Scattered bilateral renal cysts are noted, more prominent on the right. There is mild chronic left-sided hydronephrosis, with prominence of the proximal left ureter to the level of the lower abdomen, reflecting obstruction by the large lower abdominal and pelvic mass. No renal or ureteral stones are identified. Mild left-sided perinephric stranding is noted. Stomach/Bowel: There is diffuse dilatation of small-bowel loops to 6.0 cm in diameter, and distention of the  cecum to 12.0 cm in diameter. The small and large bowel are diffusely dilated. The stomach is largely filled with contrast. Distention extends to the level of the proximal sigmoid colon. A very large complex mass is noted at the mid to lower abdomen and pelvis, which appears to include a fistula  between multiple loops of small bowel, the cecum and sigmoid colon, with stool in the upper aspect of the mass. Wall thickening is noted along the adjacent small and large bowel loops, and the mass measures approximately 16 x 9 cm. Wall thickening extends to the rectum, with diffuse presacral stranding. This most likely arises from the sigmoid colon, though a small bowel or bladder source is also possible. Vascular/Lymphatic: Scattered calcification is seen along the abdominal aorta and its branches. The abdominal aorta is otherwise grossly unremarkable. The inferior vena cava is grossly unremarkable. Scattered enlarged mesenteric and retroperitoneal nodes are seen, measuring up to 1.5 cm in short axis. Vague mass tracks directly anterior to the aortic bifurcation and along the pelvic sidewall bilaterally. A 1.9 cm node is seen along the left external iliac vessels. Reproductive: Multiple masses are noted within the bladder, with diffuse irregular bladder wall thickening at the dome of the bladder, reflecting diffuse spread of disease. The prostate is borderline enlarged, measuring 4.9 cm in transverse dimension. Other: Trace fluid is noted at a small right inguinal hernia. Musculoskeletal: No acute osseous abnormalities are identified. The visualized musculature is unremarkable in appearance. IMPRESSION: 1. 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 The mass measures 16 x 9 cm. Wall thickening extends along the adjacent small and large bowel loops. 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 2. Marked diffuse dilatation of the small and large bowel loops, reflecting partial bowel obstruction by the mass. 3. Extensive retroperitoneal, mesenteric and pelvic sidewall metastatic  lymphadenopathy noted. Enlarged mediastinal node also noted, concerning for metastatic disease. 4. Multiple pulmonary nodules noted bilaterally, compatible with metastatic disease. 5. Wall thickening along the esophagus may reflect esophagitis or possibly metastatic disease. Esophagus filled with contrast, possibly reflecting gastroesophageal reflux or esophageal dysmotility. Mild achalasia cannot be excluded. 6. Mild chronic left-sided hydronephrosis, reflecting obstruction by the large lower abdominal and pelvic mass. 7. Multiple masses within the bladder, with diffuse irregular bladder wall thickening at the dome of the bladder, reflecting diffuse spread of malignancy. 8. Scattered hepatic metastases noted. 9. Patchy bilateral airspace opacities, predominantly at the right upper lobe and left lower lobe, concerning for pneumonia. Lymphangitic spread of tumor could conceivably have a similar appearance. 10. Bilateral renal cysts. 11. Trace pericardial effusion noted. Aortic Atherosclerosis (ICD10-I70.0). These results were called by telephone at the time of interpretation on 04/14/2018 at 9:41 pm to [facility], who verbally acknowledged these results. Electronically Signed   By: Garald Balding M.D.   On: 04/14/2018 21:48   Dg Abd 1 View  Result Date: 04/15/2018 CLINICAL DATA:  Encounter for nasogastric tube placement. EXAM: ABDOMEN - 1 VIEW COMPARISON:  CT abdomen pelvis of April 14, 2018 FINDINGS: Multiple dilated small bowel loops are identified throughout abdomen and pelvis. Nasogastric tube is identified with distal tip in the proximal stomach. IMPRESSION: Nasogastric tube is identified with distal tip in the proximal stomach. Multiple dilated small bowel loops  are identified throughout abdomen and pelvis as seen on prior recent CT. Electronically Signed   By: Abelardo Diesel M.D.   On: 04/15/2018 14:31   Ct Chest Wo Contrast  Result Date: 04/14/2018 CLINICAL DATA:  Chronic weight loss.  Abnormal radiograph. Further evaluation requested. EXAM: CT CHEST, ABDOMEN AND PELVIS WITHOUT CONTRAST TECHNIQUE: Multidetector CT imaging of the chest, abdomen and pelvis was performed following the standard protocol without IV contrast. COMPARISON:  None. FINDINGS: CT CHEST FINDINGS Cardiovascular: The heart is normal in size. Mild calcification is noted at the aortic arch. The great vessels are unremarkable in appearance. Mediastinum/Nodes: The esophagus is filled with contrast, which may reflect gastroesophageal reflux or esophageal dysmotility. Wall thickening along the esophagus may reflect esophagitis or possibly metastatic disease. Mild achalasia cannot be excluded. Mild coronary artery calcification is seen. An enlarged 1.8 cm precarinal node is suspicious for metastatic disease. A trace pericardial effusion is noted. The visualized portions of the thyroid gland are unremarkable. No axillary lymphadenopathy is seen. Lungs/Pleura: Patchy bilateral airspace opacities are noted, predominantly at the right upper lobe and left lower lobe, concerning for pneumonia. Lymphangitic spread of tumor could conceivably have a similar appearance. Multiple large pulmonary nodules are noted bilaterally, measuring up to 1.7 cm in size, compatible with metastatic disease. No pleural effusion or pneumothorax is seen. Musculoskeletal: No acute osseous abnormalities are identified. The visualized musculature is unremarkable in appearance. CT ABDOMEN PELVIS FINDINGS Hepatobiliary: Vague lesions within the liver measure up to 3.5 cm in size, compatible with metastatic disease. The gallbladder is not definitely characterized. The common bile duct is normal in caliber. Pancreas: The pancreas is within normal limits. Peripancreatic nodes measure up to 1.4 cm in short axis, concerning for metastatic disease. Spleen: The spleen is diminutive and unremarkable in appearance. Adrenals/Urinary Tract: The adrenal glands are grossly  unremarkable in appearance. Scattered bilateral renal cysts are noted, more prominent on the right. There is mild chronic left-sided hydronephrosis, with prominence of the proximal left ureter to the level of the lower abdomen, reflecting obstruction by the large lower abdominal and pelvic mass. No renal or ureteral stones are identified. Mild left-sided perinephric stranding is noted. Stomach/Bowel: There is diffuse dilatation of small-bowel loops to 6.0 cm in diameter, and distention of the cecum to 12.0 cm in diameter. The small and large bowel are diffusely dilated. The stomach is largely filled with contrast. Distention extends to the level of the proximal sigmoid colon. 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 Wall thickening is noted along the adjacent small and large bowel loops, and the mass measures approximately 16 x 9 cm. Wall thickening extends to the rectum, with diffuse presacral stranding. This most likely arises from the sigmoid colon, though a small bowel or bladder source is also possible. Vascular/Lymphatic: Scattered calcification is seen along the abdominal aorta and its branches. The abdominal aorta is otherwise grossly unremarkable. The inferior vena cava is grossly unremarkable. Scattered enlarged mesenteric and retroperitoneal nodes are seen, measuring up to 1.5 cm in short axis. Vague mass tracks directly anterior to the aortic bifurcation and along the pelvic sidewall bilaterally. A 1.9 cm node is seen along the left external iliac vessels. Reproductive: Multiple masses are noted within the bladder, with diffuse irregular bladder wall thickening at the dome of the bladder, reflecting  diffuse spread of disease. The prostate is borderline enlarged, measuring 4.9 cm in transverse dimension. Other: Trace fluid is  noted at a small right inguinal hernia. Musculoskeletal: No acute osseous abnormalities are identified. The visualized musculature is unremarkable in appearance. IMPRESSION: 1. 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 The mass measures 16 x 9 cm. Wall thickening extends along the adjacent small and large bowel loops. 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 2. Marked diffuse dilatation of the small and large bowel loops, reflecting partial bowel obstruction by the mass. 3. Extensive retroperitoneal, mesenteric and pelvic sidewall metastatic lymphadenopathy noted. Enlarged mediastinal node also noted, concerning for metastatic disease. 4. Multiple pulmonary nodules noted bilaterally, compatible with metastatic disease. 5. Wall thickening along the esophagus may reflect esophagitis or possibly metastatic disease. Esophagus filled with contrast, possibly reflecting gastroesophageal reflux or esophageal dysmotility. Mild achalasia cannot be excluded. 6. Mild chronic left-sided hydronephrosis, reflecting obstruction by the large lower abdominal and pelvic mass. 7. Multiple masses within the bladder, with diffuse irregular bladder wall thickening at the dome of the bladder, reflecting diffuse spread of malignancy. 8. Scattered hepatic metastases noted. 9. Patchy bilateral airspace opacities, predominantly at the right upper lobe and left lower lobe, concerning for pneumonia. Lymphangitic spread of tumor could conceivably have a similar appearance. 10. Bilateral renal cysts. 11. Trace pericardial effusion noted. Aortic Atherosclerosis (ICD10-I70.0). These results were called by telephone at the time of interpretation on 04/14/2018 at 9:41 pm to [facility] PA, who verbally  acknowledged these results. Electronically Signed   By: Garald Balding M.D.   On: 04/14/2018 21:48   Dg C-arm 1-60 Min-no Report  Result Date: 04/15/2018 Fluoroscopy was utilized by the requesting physician.  No radiographic interpretation.    Assessment and Plan:   1.  Abdomen/pelvic mass appears to arise from the sigmoid colon  Noncontrast CT 04/14/2018- abdomen/pelvic mass, partial colonic and small bowel obstruction, fistula between the mass and small bowel loops, multiple bladder tumors, left hydronephrosis, bilateral lung metastases, lymphatic tumor spread in the lungs versus pneumonia, precarinal lymph node, retroperitoneal/mesenteric lymphadenopathy  Resection of multiple bladder masses 04/15/2018-pathology pending 2.  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  Anorexia/weight loss  4.  Partial small bowel and colonic obstruction, NG tube in place   Miguel Gamble presents with an abdominal/pelvic mass, bowel obstruction, and evidence of extensive metastatic disease.  The clinical presentation is most consistent with colon cancer.  The pathology from the bladder tumor resection is pending.  I discussed the probable diagnosis and treatment options with Miguel Gamble and his wife.  He understands no therapy will be curative.  The urinary obstruction has been relieved with placement of ureter stents and the creatinine has improved.  He appears to have a partial bowel obstruction from tumor.  The obstruction could improve with systemic therapy, but I would expect this to take weeks to a few months.  He will likely benefit from a diverting ostomy.  He and his wife work for [employer] and live near [place].  He reports [facility] is out of network with his insurance plan.  It may be more convenient for him to be treated in the [facility] system.  Recommendations: 1.  Management of bowel obstruction per  surgery and gastroenterology, consider diverting ostomy 2.  Follow-up pathology from the 04/15/2018 bladder resection 3.  Check CEA 4.  I will continue following him in the hospital and outpatient follow-up will be arranged.    Betsy Coder, MD 04/16/2018, 7:18 AM

## 2018-04-17 ENCOUNTER — Inpatient Hospital Stay (HOSPITAL_COMMUNITY): Payer: PRIVATE HEALTH INSURANCE | Admitting: Anesthesiology

## 2018-04-17 ENCOUNTER — Encounter (HOSPITAL_COMMUNITY)
Admission: EM | Disposition: A | Discharge status: Home / Self Care | Payer: Self-pay | Source: Home / Self Care | Attending: Internal Medicine

## 2018-04-17 DIAGNOSIS — C772 Secondary and unspecified malignant neoplasm of intra-abdominal lymph nodes: Secondary | ICD-10-CM

## 2018-04-17 DIAGNOSIS — C187 Malignant neoplasm of sigmoid colon: Secondary | ICD-10-CM

## 2018-04-17 HISTORY — PX: LAPAROTOMY: SHX154

## 2018-04-17 HISTORY — PX: COLOSTOMY: SHX63

## 2018-04-17 LAB — BASIC METABOLIC PANEL
ANION GAP: 14 (ref 5–15)
BUN: 68 mg/dL — ABNORMAL HIGH (ref 8–23)
CO2: 17 mmol/L — ABNORMAL LOW (ref 22–32)
Calcium: 8.8 mg/dL — ABNORMAL LOW (ref 8.9–10.3)
Chloride: 120 mmol/L — ABNORMAL HIGH (ref 98–111)
Creatinine, Ser: 2.14 mg/dL — ABNORMAL HIGH (ref 0.61–1.24)
GFR calc Af Amer: 36 mL/min — ABNORMAL LOW (ref >60)
GFR calc non Af Amer: 31 mL/min — ABNORMAL LOW (ref >60)
Glucose, Bld: 122 mg/dL — ABNORMAL HIGH (ref 70–99)
POTASSIUM: 3.6 mmol/L (ref 3.5–5.1)
Sodium: 151 mmol/L — ABNORMAL HIGH (ref 135–145)

## 2018-04-17 LAB — SURGICAL PCR SCREEN
MRSA, PCR: NEGATIVE
Staphylococcus aureus: POSITIVE — AB

## 2018-04-17 LAB — URINE CULTURE: Culture: NO GROWTH

## 2018-04-17 LAB — GLUCOSE, CAPILLARY: Glucose-Capillary: 135 mg/dL — ABNORMAL HIGH (ref 70–99)

## 2018-04-17 LAB — CEA: CEA1: 208 ng/mL — AB (ref 0.0–4.7)

## 2018-04-17 SURGERY — CREATION, COLOSTOMY
Anesthesia: General

## 2018-04-17 MED ORDER — PROPOFOL 10 MG/ML IV BOLUS
INTRAVENOUS | Status: DC | PRN
  Administered 2018-04-17: 150 mg via INTRAVENOUS

## 2018-04-17 MED ORDER — MEPERIDINE HCL 50 MG/ML IJ SOLN: 6.2500 mg | INTRAMUSCULAR | Status: DC | PRN

## 2018-04-17 MED ORDER — HYDROMORPHONE HCL 1 MG/ML IJ SOLN: 0.2500 mg | INTRAMUSCULAR | Status: DC | PRN

## 2018-04-17 MED ORDER — DEXTROSE-NACL 5-0.45 % IV SOLN
INTRAVENOUS | Status: DC
  Administered 2018-04-17 – 2018-04-18 (×3): via INTRAVENOUS

## 2018-04-17 MED ORDER — SODIUM CHLORIDE 0.9% FLUSH: 9.0000 mL | INTRAVENOUS | Status: DC | PRN

## 2018-04-17 MED ORDER — ROCURONIUM BROMIDE 10 MG/ML (PF) SYRINGE
PREFILLED_SYRINGE | INTRAVENOUS | Status: AC
  Filled 2018-04-17: qty 10

## 2018-04-17 MED ORDER — FENTANYL CITRATE (PF) 100 MCG/2ML IJ SOLN
INTRAMUSCULAR | Status: DC | PRN
  Administered 2018-04-17 (×2): 50 ug via INTRAVENOUS
  Administered 2018-04-17: 100 ug via INTRAVENOUS
  Administered 2018-04-17: 50 ug via INTRAVENOUS

## 2018-04-17 MED ORDER — BUPIVACAINE-EPINEPHRINE (PF) 0.25% -1:200000 IJ SOLN
INTRAMUSCULAR | Status: AC
  Filled 2018-04-17: qty 30

## 2018-04-17 MED ORDER — ONDANSETRON HCL 4 MG/2ML IJ SOLN
INTRAMUSCULAR | Status: AC
  Filled 2018-04-17: qty 2

## 2018-04-17 MED ORDER — SUCCINYLCHOLINE CHLORIDE 200 MG/10ML IV SOSY
PREFILLED_SYRINGE | INTRAVENOUS | Status: AC
  Filled 2018-04-17: qty 10

## 2018-04-17 MED ORDER — DEXAMETHASONE SODIUM PHOSPHATE 10 MG/ML IJ SOLN
INTRAMUSCULAR | Status: DC | PRN
  Administered 2018-04-17: 10 mg via INTRAVENOUS

## 2018-04-17 MED ORDER — FENTANYL CITRATE (PF) 250 MCG/5ML IJ SOLN
INTRAMUSCULAR | Status: AC
  Filled 2018-04-17: qty 5

## 2018-04-17 MED ORDER — BUPIVACAINE-EPINEPHRINE (PF) 0.25% -1:200000 IJ SOLN
INTRAMUSCULAR | Status: DC | PRN
  Administered 2018-04-17: 10 mL

## 2018-04-17 MED ORDER — ONDANSETRON HCL 4 MG/2ML IJ SOLN: 4.0000 mg | Freq: Once | INTRAMUSCULAR | Status: DC | PRN

## 2018-04-17 MED ORDER — DIPHENHYDRAMINE HCL 50 MG/ML IJ SOLN: 12.5000 mg | Freq: Four times a day (QID) | INTRAMUSCULAR | Status: DC | PRN

## 2018-04-17 MED ORDER — LIDOCAINE HCL (CARDIAC) PF 100 MG/5ML IV SOSY
PREFILLED_SYRINGE | INTRAVENOUS | Status: DC | PRN
  Administered 2018-04-17: 60 mg via INTRAVENOUS

## 2018-04-17 MED ORDER — DEXAMETHASONE SODIUM PHOSPHATE 10 MG/ML IJ SOLN
INTRAMUSCULAR | Status: AC
  Filled 2018-04-17: qty 1

## 2018-04-17 MED ORDER — CHLORHEXIDINE GLUCONATE CLOTH 2 % EX PADS
6.0000 | MEDICATED_PAD | Freq: Every day | CUTANEOUS | Status: DC
  Administered 2018-04-17 – 2018-04-22 (×4): 6 via TOPICAL

## 2018-04-17 MED ORDER — NALOXONE HCL 0.4 MG/ML IJ SOLN: 0.4000 mg | INTRAMUSCULAR | Status: DC | PRN

## 2018-04-17 MED ORDER — SUGAMMADEX SODIUM 500 MG/5ML IV SOLN
INTRAVENOUS | Status: AC
  Filled 2018-04-17: qty 5

## 2018-04-17 MED ORDER — METRONIDAZOLE IN NACL 5-0.79 MG/ML-% IV SOLN
INTRAVENOUS | Status: AC
  Filled 2018-04-17: qty 100

## 2018-04-17 MED ORDER — ONDANSETRON HCL 4 MG/2ML IJ SOLN: 4.0000 mg | Freq: Four times a day (QID) | INTRAMUSCULAR | Status: DC | PRN

## 2018-04-17 MED ORDER — 0.9 % SODIUM CHLORIDE (POUR BTL) OPTIME
TOPICAL | Status: DC | PRN
  Administered 2018-04-17: 1000 mL

## 2018-04-17 MED ORDER — PHENYLEPHRINE 40 MCG/ML (10ML) SYRINGE FOR IV PUSH (FOR BLOOD PRESSURE SUPPORT)
PREFILLED_SYRINGE | INTRAVENOUS | Status: AC
  Filled 2018-04-17: qty 10

## 2018-04-17 MED ORDER — ROCURONIUM BROMIDE 100 MG/10ML IV SOLN
INTRAVENOUS | Status: DC | PRN
  Administered 2018-04-17: 70 mg via INTRAVENOUS

## 2018-04-17 MED ORDER — MORPHINE SULFATE 2 MG/ML IV SOLN
INTRAVENOUS | Status: DC
  Administered 2018-04-17: 13:00:00 via INTRAVENOUS
  Administered 2018-04-17 – 2018-04-20 (×8): 0 mg via INTRAVENOUS
  Filled 2018-04-17: qty 30

## 2018-04-17 MED ORDER — ONDANSETRON HCL 4 MG/2ML IJ SOLN
INTRAMUSCULAR | Status: DC | PRN
  Administered 2018-04-17: 4 mg via INTRAVENOUS

## 2018-04-17 MED ORDER — SUGAMMADEX SODIUM 500 MG/5ML IV SOLN
INTRAVENOUS | Status: DC | PRN
  Administered 2018-04-17: 300 mg via INTRAVENOUS

## 2018-04-17 MED ORDER — LIDOCAINE 2% (20 MG/ML) 5 ML SYRINGE
INTRAMUSCULAR | Status: AC
  Filled 2018-04-17: qty 5

## 2018-04-17 MED ORDER — PROPOFOL 10 MG/ML IV BOLUS
INTRAVENOUS | Status: AC
  Filled 2018-04-17: qty 20

## 2018-04-17 MED ORDER — DIPHENHYDRAMINE HCL 12.5 MG/5ML PO ELIX: 12.5000 mg | ORAL_SOLUTION | Freq: Four times a day (QID) | ORAL | Status: DC | PRN

## 2018-04-17 MED ORDER — SUGAMMADEX SODIUM 200 MG/2ML IV SOLN
INTRAVENOUS | Status: AC
  Filled 2018-04-17: qty 2

## 2018-04-17 MED ORDER — BUPIVACAINE LIPOSOME 1.3 % IJ SUSP
20.0000 mL | Freq: Once | INTRAMUSCULAR | Status: AC
  Administered 2018-04-17: 10 mL
  Filled 2018-04-17: qty 20

## 2018-04-17 MED ORDER — MUPIROCIN 2 % EX OINT
TOPICAL_OINTMENT | Freq: Two times a day (BID) | CUTANEOUS | Status: DC
  Administered 2018-04-17 – 2018-04-22 (×10): via NASAL
  Filled 2018-04-17 (×3): qty 22

## 2018-04-17 MED ORDER — PHENYLEPHRINE HCL 10 MG/ML IJ SOLN
INTRAMUSCULAR | Status: DC | PRN
  Administered 2018-04-17 (×2): 80 ug via INTRAVENOUS

## 2018-04-17 MED ORDER — SODIUM CHLORIDE 0.9 % IV SOLN
INTRAVENOUS | Status: DC
  Administered 2018-04-17: 10:00:00 via INTRAVENOUS

## 2018-04-17 MED ORDER — ACETAMINOPHEN 10 MG/ML IV SOLN: 1000.0000 mg | Freq: Once | INTRAVENOUS | Status: DC | PRN

## 2018-04-17 MED ORDER — HYDROCODONE-ACETAMINOPHEN 7.5-325 MG PO TABS: 1.0000 | ORAL_TABLET | Freq: Once | ORAL | Status: DC | PRN

## 2018-04-17 SURGICAL SUPPLY — 51 items
APPLICATOR COTTON TIP 6 STRL (MISCELLANEOUS) ×2 IMPLANT
APPLICATOR COTTON TIP 6IN STRL (MISCELLANEOUS)
BLADE EXTENDED COATED 6.5IN (ELECTRODE) IMPLANT
BLADE HEX COATED 2.75 (ELECTRODE) ×3 IMPLANT
BLADE SURG SZ10 CARB STEEL (BLADE) ×3 IMPLANT
CATH ROBINSON RED A/P 14FR (CATHETERS) ×2 IMPLANT
CHLORAPREP W/TINT 26ML (MISCELLANEOUS) ×3 IMPLANT
CLIP VESOCCLUDE LG 6/CT (CLIP) IMPLANT
COVER MAYO STAND STRL (DRAPES) ×3 IMPLANT
COVER SURGICAL LIGHT HANDLE (MISCELLANEOUS) ×3 IMPLANT
COVER WAND RF STERILE (DRAPES) ×2 IMPLANT
DRAPE LAPAROSCOPIC ABDOMINAL (DRAPES) ×3 IMPLANT
DRAPE SHEET LG 3/4 BI-LAMINATE (DRAPES) IMPLANT
DRAPE WARM FLUID 44X44 (DRAPE) ×3 IMPLANT
DRSG OPSITE POSTOP 4X10 (GAUZE/BANDAGES/DRESSINGS) ×2 IMPLANT
ELECT REM PT RETURN 15FT ADLT (MISCELLANEOUS) ×3 IMPLANT
GAUZE SPONGE 4X4 12PLY STRL (GAUZE/BANDAGES/DRESSINGS) ×1 IMPLANT
GLOVE BIOGEL PI IND STRL 7.0 (GLOVE) ×1 IMPLANT
GLOVE BIOGEL PI INDICATOR 7.0 (GLOVE) ×2
GLOVE SURG SIGNA 7.5 PF LTX (GLOVE) ×3 IMPLANT
GOWN STRL REUS W/TWL LRG LVL3 (GOWN DISPOSABLE) ×1 IMPLANT
GOWN STRL REUS W/TWL XL LVL3 (GOWN DISPOSABLE) ×4 IMPLANT
HANDLE SUCTION POOLE (INSTRUMENTS) ×1 IMPLANT
KIT BASIN OR (CUSTOM PROCEDURE TRAY) ×3 IMPLANT
LEGGING LITHOTOMY PAIR STRL (DRAPES) ×1 IMPLANT
NEEDLE HYPO 22GX1.5 SAFETY (NEEDLE) ×4 IMPLANT
NS IRRIG 1000ML POUR BTL (IV SOLUTION) ×4 IMPLANT
PACK GENERAL/GYN (CUSTOM PROCEDURE TRAY) ×3 IMPLANT
SHEARS HARMONIC ACE PLUS 36CM (ENDOMECHANICALS) IMPLANT
STAPLER VISISTAT 35W (STAPLE) ×3 IMPLANT
SUCTION POOLE HANDLE (INSTRUMENTS)
SUT ETHILON 2 0 PS N (SUTURE) ×2 IMPLANT
SUT NOV 1 T60/GS (SUTURE) IMPLANT
SUT NOVA T20/GS 25 (SUTURE) ×2 IMPLANT
SUT PDS AB 1 CTX 36 (SUTURE) ×2 IMPLANT
SUT PDS AB 1 TP1 54 (SUTURE) IMPLANT
SUT PDS AB 1 TP1 96 (SUTURE) ×4 IMPLANT
SUT PROLENE 2 0 KS (SUTURE) IMPLANT
SUT SILK 2 0 (SUTURE) ×2
SUT SILK 2 0 SH CR/8 (SUTURE) ×1 IMPLANT
SUT SILK 2 0SH CR/8 30 (SUTURE) IMPLANT
SUT SILK 2-0 18XBRD TIE 12 (SUTURE) ×1 IMPLANT
SUT SILK 2-0 30XBRD TIE 12 (SUTURE) IMPLANT
SUT SILK 3 0 (SUTURE) ×2
SUT SILK 3 0 SH CR/8 (SUTURE) ×3 IMPLANT
SUT SILK 3-0 18XBRD TIE 12 (SUTURE) ×1 IMPLANT
SYRINGE 20CC LL (MISCELLANEOUS) ×4 IMPLANT
TOWEL OR 17X26 10 PK STRL BLUE (TOWEL DISPOSABLE) ×4 IMPLANT
TRAY FOLEY MTR SLVR 16FR STAT (SET/KITS/TRAYS/PACK) ×1 IMPLANT
WATER STERILE IRR 1000ML POUR (IV SOLUTION) IMPLANT
YANKAUER SUCT BULB TIP NO VENT (SUCTIONS) ×3 IMPLANT

## 2018-04-17 NOTE — Progress Notes (Signed)
Initial Nutrition Assessment  INTERVENTION:   Diet advancement per MD Once diet advanced, recommend nutritional supplements  NUTRITION DIAGNOSIS:   Inadequate oral intake related to (Partial SBO) as evidenced by NPO status.  GOAL:   Patient will meet greater than or equal to 90% of their needs  MONITOR:   Diet advancement, Labs, Weight trends, I & O's  REASON FOR ASSESSMENT:   Malnutrition Screening Tool    ASSESSMENT:   68 y.o. male who denies any significant past medical history, now presenting to the emergency department with several months of progressive abdominal discomfort, weight loss, intermittent loose stools, and loss of appetite. 12/31: s/p CYSTOSCOPY BILATERAL WITH RETROGRADE PYELOGRAM/URETERAL BILATERAL STENT PLACEMENT WITH TRANSURETHRAL RESECTION OF BLADDER TUMOR (Bilateral )  Patient currently in OR for diverting loop colostomy. Per chart review, pt has had abdominal discomfort, diarrhea and poor appetite for months now. Pt had no N/V during this time. Pt with mass from sigmoid colon and cystoscopy 12/31 revealed bladder tumors.   Pt had NGT placed to LIS, last output documented was ~450 ml.   Pt would likely benefit from nutrition supplements once diet is advanced.   No weight history beyond this admission is available in chart. Pt reports weight loss in MST.   Medications: D5-.45% NaCl infusion at 75 ml/hr Labs reviewed: Elevated Na   NUTRITION - FOCUSED PHYSICAL EXAM:  Pt in OR.  Diet Order:   Diet Order            Diet NPO time specified Except for: Ice Chips  Diet effective now              EDUCATION NEEDS:   Not appropriate for education at this time  Skin:  Skin Assessment: Reviewed RN Assessment  Last BM:  1/1  Height:   Ht Readings from Last 1 Encounters:  04/15/18 5\' 8"  (1.727 m)    Weight:   Wt Readings from Last 1 Encounters:  04/17/18 60.1 kg    Ideal Body Weight:  70 kg  BMI:  Body mass index is 20.15  kg/m.  Estimated Nutritional Needs:   Kcal:  1500-1700  Protein:  75-85g  Fluid:  1.7L/day   Clayton Bibles, MS, RD, LDN [place] Dietitian Pager: [phone] After Hours Pager: [phone]

## 2018-04-17 NOTE — Consult Note (Addendum)
[facility] Nurse requested for preoperative stoma site marking  Discussed surgical procedure and stoma creation with patient and family member at the bedside.  Explained role of the [facility] nurse team.  Provided the patient with educational booklet and provided samples of pouching options.  Answered patient and family member's questions.   Examined patient lying and sitting upright, mobility was limited related to nausea and pain, in order to place the marking in the patient's visual field, away from any creases or abdominal contour issues and within the rectus muscle.  Attempted to mark below the patient's belt line.   Marked for colostomy in the LLQ  __7__ cm to the left of the umbilicus and __4__MW below the umbilicus.  Marked for ileostomy in the RLQ  __7__cm to the right of the umbilicus and  __1__ cm below the umbilicus.  Patient's abdomen cleansed with CHG wipes at site markings, allowed to air dry prior to marking. Pt plans for surgery this am.  [facility] Nurse team will follow up with patient after surgery for continued ostomy care and teaching.   Julien Girt MSN, RN, [place], [place], [place]

## 2018-04-17 NOTE — Progress Notes (Signed)
[facility] PROGRESS NOTE    Progress Note  Miguel Gamble  YNW:[id number] DOB: 11/27/1950 DOA: 04/14/2018 PCP: Miguel Low, MD     Brief Narrative:   Miguel Gamble is an 68 y.o. 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 Patient is status post cystoscopy with bilateral retrograde pyelogram with bilateral stent placement and transurethral resection of bladder tumor Diet per surgery Status post diverting colostomy on 04/17/2018, NG tube in place. CEA was 208, pathology report of biopsy showed colonic adenocarcinoma poorly differentiated. Patient has a poor prognoses will have to discuss with oncology to involve palliative care.  Pelvic mass with metastases and hydronephrosis leading to acute renal failure: Creatinine on admission 2.4. Most likely postobstructive. Urology was consulted who performed cystoscopy with bilateral stent placement creatinine is slowly improving we will continue IV fluids but will change him to D5W check a basic metabolic panel in the morning.  Leukocytosis: Likely due to above has remained afebrile.  Probable community-acquired pneumonia: Lung nodules on CT versus lymphangitic spread Culture data is pending. He was started empirically on Rocephin and azithromycin urine antigens are negative.  Normocytic anemia HemoGlobin today is 8.6.    DVT prophylaxis: lovenxo Family Communication:wife Disposition Plan/Barrier to D/C: Once able to tolerate orals. Code Status:     Code Status Orders  (From admission, onward)        Start     Ordered   04/14/18 2216  Full code  Continuous     04/14/18 2218        Code Status History    This patient has a current code status but no historical code status.        IV Access:    Peripheral IV   Procedures and diagnostic studies:   Dg Abd 1 View  Result Date: 04/15/2018 CLINICAL DATA:  Encounter for nasogastric tube placement. EXAM: ABDOMEN - 1 VIEW COMPARISON:  CT abdomen pelvis of April 14, 2018 FINDINGS: Multiple dilated small bowel loops are identified throughout abdomen and pelvis. Nasogastric tube is identified with distal tip in the proximal stomach. IMPRESSION: Nasogastric tube is identified with distal tip in the proximal stomach. Multiple dilated small bowel loops are identified throughout abdomen and pelvis as seen on prior recent CT. Electronically Signed   By: Abelardo Diesel M.D.   On: 04/15/2018 14:31   Dg C-arm 1-60 Min-no Report  Result Date: 04/15/2018 Fluoroscopy was utilized by the requesting physician.  No radiographic interpretation.     Medical Consultants:    None.  Anti-Infectives:   Rocephin  Subjective:    [name] great with NG tube in place, post surgery eating ice feels great.  Has not required narcotics.  Objective:    Vitals:   04/17/18 0539 04/17/18 1104 04/17/18 1115 04/17/18 1130  BP: 131/78 137/80 137/82 133/84  Pulse: 98 99 96 94  Resp: 18 (!) 22 20 18   Temp: 98.9 F (37.2 C) 98.1 F (36.7 C)  97.6 F (36.4 C)  TempSrc: Oral     SpO2: 97% 100% 100% 97%  Weight: 60.1 kg     Height:  Intake/Output Summary (Last 24 hours) at 04/17/2018 1355 Last data filed at 04/17/2018 1107 Gross per 24 hour  Intake 1269.93 ml  Output 1575 ml  Net -305.07 ml   Filed Weights   04/15/18 0523 04/15/18 1532 04/17/18 0539  Weight: 59.7 kg 59.7 kg 60.1 kg    Exam: General exam: In no acute distress. Respiratory system: Air movement and clear to auscultation. Cardiovascular system: Rate and rhythm  positive S1-S2 Gastrointestinal system: Bowel sounds soft nontender nondistended Central nervous system: Alert and oriented. No focal neurological deficits. Extremities: No pedal edema. Skin: No rashes, lesions or ulcers Psychiatry: Judgement and insight appear normal. Mood & affect appropriate.    Data Reviewed:    Labs: Basic Metabolic Panel: Recent Labs  Lab 04/14/18 1801 04/15/18 0603 04/16/18 0418 04/17/18 0348  NA 144 145 150* 151*  K 4.0 3.2* 3.9 3.6  CL 111 115* 120* 120*  CO2 18* 17* 16* 17*  GLUCOSE 125* 99 87 122*  BUN 75* 76* 61* 68*  CREATININE 2.83* 2.24* 1.98* 2.14*  CALCIUM 9.0 8.3* 8.7* 8.8*  MG  --   --  2.7*  --    GFR Estimated Creatinine Clearance: 28.1 mL/min (A) (by C-G formula based on SCr of 2.14 mg/dL (H)). Liver Function Tests: Recent Labs  Lab 04/14/18 1801 04/16/18 0418  AST 47* 30  ALT 26 24  ALKPHOS 83 72  BILITOT 1.0 0.8  PROT 7.3 6.4*  ALBUMIN 3.4* 2.7*   Recent Labs  Lab 04/14/18 1801  LIPASE 23   No results for input(s): AMMONIA in the last 168 hours. Coagulation profile No results for input(s): INR, PROTIME in the last 168 hours.  CBC: Recent Labs  Lab 04/14/18 1801 04/15/18 0603 04/16/18 0418  WBC 20.3* 16.1* 15.7*  NEUTROABS  --  14.6* 14.4*  HGB 8.9* 7.5* 8.6*  HCT 29.3* 23.7* 27.8*  MCV 96.1 90.5 94.9  PLT 315 311 308   Cardiac Enzymes: No results for input(s): CKTOTAL, CKMB, CKMBINDEX, TROPONINI in the last 168 hours. BNP (last 3 results) No results for input(s): PROBNP in the last 8760 hours. CBG: Recent Labs  Lab 04/15/18 0952 04/16/18 0759 04/17/18 0741  GLUCAP 85 76 135*   D-Dimer: No results for input(s): DDIMER in the last 72 hours. Hgb A1c: No results for input(s): HGBA1C in the last 72 hours. Lipid Profile: No results for input(s): CHOL, HDL, LDLCALC, TRIG, CHOLHDL, LDLDIRECT in the last 72 hours. Thyroid function studies: No results for input(s): TSH, T4TOTAL, T3FREE, THYROIDAB in the  last 72 hours.  Invalid input(s): FREET3 Anemia work up: No results for input(s): VITAMINB12, FOLATE, FERRITIN, TIBC, IRON, RETICCTPCT in the last 72 hours. Sepsis Labs: Recent Labs  Lab 04/14/18 1801 04/14/18 1808 04/14/18 2009 04/15/18 0603 04/16/18 0418  WBC 20.3*  --   --  16.1* 15.7*  LATICACIDVEN  --  3.65* 1.86  --   --    Microbiology Recent Results (from the past 240 hour(s))  Culture, Urine     Status: None   Collection Time: 04/15/18  6:15 PM  Result Value Ref Range Status   Specimen Description   Final    URINE, CLEAN CATCH Performed at [facility], [place] [address] [place], [place] [address]    Special Requests   Final    NONE Performed at [facility], [place] [address] [place], [place] [address]    Culture   Final    NO GROWTH Performed at [facility]  [facility], [address] [place], [place] [address]    Report Status 04/17/2018 FINAL  Final  Surgical pcr screen     Status: Abnormal   Collection Time: 04/17/18  2:18 AM  Result Value Ref Range Status   MRSA, PCR NEGATIVE NEGATIVE Final   Staphylococcus aureus POSITIVE (A) NEGATIVE Final    Comment: (NOTE) The Xpert SA Assay (FDA approved for NASAL specimens in patients 72 years of age and older), is one component of a comprehensive surveillance program. It is not intended to diagnose infection nor to guide or monitor treatment. Performed at [facility], [place] [address] [place], [place] [address]      Medications:   . Chlorhexidine Gluconate Cloth  6 each Topical Q0600  . morphine   Intravenous Q4H  . mupirocin ointment   Nasal BID   Continuous Infusions: . sodium chloride 10 mL/hr at 04/16/18 1231  . azithromycin 500 mg (04/16/18 2231)  . cefTRIAXone (ROCEPHIN)  IV 1 g (04/16/18 2140)  . dextrose 5 % and 0.45% NaCl 75 mL/hr at 04/17/18 1258  . metroNIDAZOLE         LOS: 2 days   Charlynne Cousins  [facility]   *Please refer to [email or web address], password [email or web address] to get updated schedule on who will round on this patient, as hospitalists switch teams weekly. If 7PM-7AM, please contact night-coverage at [email or web address], password [email or web address] for any overnight needs.  04/17/2018, 1:55 PM

## 2018-04-17 NOTE — Anesthesia Preprocedure Evaluation (Addendum)
Anesthesia Evaluation  Patient identified by MRN, date of birth, ID band Patient awake    Reviewed: Allergy & Precautions, NPO status , Patient's Chart, lab work & pertinent test results  Airway Mallampati: II  TM Distance: >3 FB Neck ROM: Full    Dental no notable dental hx. (+) Teeth Intact, Dental Advisory Given   Pulmonary neg pulmonary ROS,    Pulmonary exam normal breath sounds clear to auscultation       Cardiovascular negative cardio ROS Normal cardiovascular exam Rhythm:Regular Rate:Normal     Neuro/Psych negative neurological ROS  negative psych ROS   GI/Hepatic negative GI ROS, Neg liver ROS,   Endo/Other  negative endocrine ROS  Renal/GU Renal disease     Musculoskeletal   Abdominal   Peds  Hematology  (+) anemia ,   Anesthesia Other Findings   Reproductive/Obstetrics                             Anesthesia Physical Anesthesia Plan  ASA: III  Anesthesia Plan: General   Post-op Pain Management:    Induction: Intravenous  PONV Risk Score and Plan: 2 and Treatment may vary due to age or medical condition, Ondansetron and Dexamethasone  Airway Management Planned: Oral ETT  Additional Equipment:   Intra-op Plan:   Post-operative Plan: Extubation in OR  Informed Consent: I have reviewed the patients History and Physical, chart, labs and discussed the procedure including the risks, benefits and alternatives for the proposed anesthesia with the patient or authorized representative who has indicated his/her understanding and acceptance.   Dental advisory given  Plan Discussed with: CRNA  Anesthesia Plan Comments:         Anesthesia Quick Evaluation

## 2018-04-17 NOTE — Anesthesia Postprocedure Evaluation (Signed)
Anesthesia Post Note  Patient: Miguel Gamble  Procedure(s) Performed: COLOSTOMY (N/A ) EXPLORATORY LAPAROTOMY (N/A )     Patient location during evaluation: PACU Anesthesia Type: General Level of consciousness: awake and alert Pain management: pain level controlled Vital Signs Assessment: post-procedure vital signs reviewed and stable Respiratory status: spontaneous breathing, nonlabored ventilation, respiratory function stable and patient connected to nasal cannula oxygen Cardiovascular status: blood pressure returned to baseline and stable Postop Assessment: no apparent nausea or vomiting Anesthetic complications: no    Last Vitals:  Vitals:   04/17/18 1130 04/17/18 1405  BP: 133/84 (!) 142/78  Pulse: 94 94  Resp: 18 16  Temp: 36.4 C 36.9 C  SpO2: 97% 100%    Last Pain:  Vitals:   04/17/18 1405  TempSrc: Oral  PainSc:                  Barnet Glasgow

## 2018-04-17 NOTE — Anesthesia Procedure Notes (Signed)
Procedure Name: Intubation Date/Time: 04/17/2018 10:05 AM Performed by: Glory Buff, CRNA Pre-anesthesia Checklist: Patient identified, Emergency Drugs available, Suction available and Patient being monitored Patient Re-evaluated:Patient Re-evaluated prior to induction Oxygen Delivery Method: Circle system utilized Preoxygenation: Pre-oxygenation with 100% oxygen Induction Type: IV induction Ventilation: Mask ventilation without difficulty Laryngoscope Size: Miller and 3 Grade View: Grade I Tube type: Oral Tube size: 7.5 mm Number of attempts: 1 Airway Equipment and Method: Stylet and Oral airway Placement Confirmation: ETT inserted through vocal cords under direct vision,  positive ETCO2 and breath sounds checked- equal and bilateral Secured at: 20 cm Tube secured with: Tape Dental Injury: Teeth and Oropharynx as per pre-operative assessment

## 2018-04-17 NOTE — Progress Notes (Signed)
Patient ID: Miguel Gamble, male   DOB: 07/16/1950, 68 y.o.   MRN: [id number]  Pre Procedure note for inpatients:   [name] has been scheduled for a Diverting Loop Colostomy. The various methods of treatment have been discussed with the patient. After consideration of the risks, benefits and treatment options the patient has consented to the planned procedure.   The patient has been seen and labs reviewed. There are no changes in the patient's condition to prevent proceeding with the planned procedure today.  Recent labs:  Lab Results  Component Value Date   WBC 15.7 (H) 04/16/2018   HGB 8.6 (L) 04/16/2018   HCT 27.8 (L) 04/16/2018   PLT 308 04/16/2018   GLUCOSE 122 (H) 04/17/2018   ALT 24 04/16/2018   AST 30 04/16/2018   NA 151 (H) 04/17/2018   K 3.6 04/17/2018   CL 120 (H) 04/17/2018   CREATININE 2.14 (H) 04/17/2018   BUN 68 (H) 04/17/2018   CO2 17 (L) 04/17/2018    [name], MD 04/17/2018 7:30 AM

## 2018-04-17 NOTE — Op Note (Signed)
COLOSTOMY, EXPLORATORY LAPAROTOMY  Procedure Note  Miguel Gamble 04/14/2018 - 04/17/2018   Pre-op Diagnosis: colon obstruction     Post-op Diagnosis: same  Procedure(s): EXPLORATORY LAPAROTOMY DIVERTING TRANSVERSE LOOP COLOSTOMY  Surgeon(s): [name], MD  Anesthesia: General  Staff:  Circulator: Primitivo Gauze, RN Relief Circulator: Noralyn Pick, RN Scrub Person: Ophelia Charter, CST  Estimated Blood Loss: Minimal               Indications: This is a 68 year old gentleman with an obstructing pelvic mass.  The mass is obstructing his distal colon.  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  He also has metastatic disease.  The decision was made to proceed with a diverting loop colostomy  Findings: The patient had extensively dilated loops of small bowel.  I could feel the large, fixed mass with multiple pelvic nodules down in the pelvis.  I did not try to visualize this.  He also palpate what appeared to be in a metastatic implant on the liver.  I could also not visualize this through my small incision.  The decision was made just to proceed with a loop colostomy rather than a formal exploratory laparotomy which would have necessitated a much larger incision and possible need for small bowel resection.  Procedure: The patient was brought to the operating room and identified as correct patient.  He was placed upon the operating room table and general anesthesia was induced.  His abdomen was then prepped and draped in usual sterile fashion.  I created a midline incision with a scalpel.  I took this down through the fascia and peritoneum with the cautery.  The patient had extensively dilated small bowel.  Through the incision I could palpate down into the pelvis and felt a very large fixed mass probably involving the colon and retroperitoneum.  There were nodular implants in the pelvis as well.  I could also palpate a 2 to 3 cm implant on the right lobe of the liver.  I  could not visualize either of these areas to my small incision.  I elected not to do a large laparotomy and just proceed with the planned loop colostomy.  There was nothing that I can visibly biopsy currently.  At this point I was able to grasp transverse colon with a Babcock clamp.  I made a circular incision the patient's left upper quadrant with a scalpel.  I then took this down to the fascia which was opened in a cruciate fashion.  The underlying muscles were retracted and the peritoneum was opened.  I then pulled out the transverse colon through this as a loop colostomy.  I closed the patient's midline fascia with a running #1 looped PDS suture.  I injected Exparel into the fascia circumferentially.  The skin was then closed with staples.  I placed a red rubber catheter underneath the colon and secured in place with nylon sutures.  I then opened up the colon with the cautery.  I then matured the colostomy circumferentially with interrupted 3-0 Vicryl sutures.  The patient tolerated the procedure well.  All the counts were correct at the end of the procedure.  Dressings were applied as well as an appliance.  The patient was then extubated in the operating room and taken in a stable condition to the recovery room.          [name]   Date: 04/17/2018  Time: 10:47 AM

## 2018-04-17 NOTE — Transfer of Care (Signed)
Immediate Anesthesia Transfer of Care Note  Patient: Miguel Gamble  Procedure(s) Performed: COLOSTOMY (N/A ) EXPLORATORY LAPAROTOMY (N/A )  Patient Location: PACU  Anesthesia Type:General  Level of Consciousness: awake, oriented and patient cooperative  Airway & Oxygen Therapy: Patient Spontanous Breathing and Patient connected to face mask oxygen  Post-op Assessment: Report given to RN and Post -op Vital signs reviewed and stable  Post vital signs: Reviewed and stable  Last Vitals:  Vitals Value Taken Time  BP 137/80 04/17/2018 11:04 AM  Temp 36.7 C 04/17/2018 11:04 AM  Pulse 95 04/17/2018 11:07 AM  Resp 15 04/17/2018 11:07 AM  SpO2 100 % 04/17/2018 11:07 AM  Vitals shown include unvalidated device data.  Last Pain:  Vitals:   04/17/18 1104  TempSrc:   PainSc: 0-No pain         Complications: No apparent anesthesia complications

## 2018-04-17 NOTE — Progress Notes (Signed)
Patient ID: Jiovani Mccammon, male   DOB: 05-17-1950, 68 y.o.   MRN: [id number]  2 Days Post-Op Subjective: Still with urinary urgency and frequency as expected s/p TURBT.  Plan for ostomy diversion today.  Objective: Vital signs in last 24 hours: Temp:  [98.4 F (36.9 C)-98.6 F (37 C)] 98.4 F (36.9 C) (01/01 2054) Pulse Rate:  [102-108] 102 (01/01 2054) Resp:  [16-18] 18 (01/01 2054) BP: (134-154)/(84-92) 134/84 (01/01 2054) SpO2:  [96 %-97 %] 97 % (01/01 2054)  Intake/Output from previous day: 01/01 0701 - 01/02 0700 In: 354.9 [I.V.:354.9] Out: 2050 [Urine:450; Emesis/NG output:1600] Intake/Output this shift: Total I/O In: -  Out: 475 [Urine:25; Emesis/NG output:450]  Physical Exam:  General: Alert and oriented GU: Urine clear  Lab Results: Recent Labs    04/14/18 1801 04/15/18 0603 04/16/18 0418  HGB 8.9* 7.5* 8.6*  HCT 29.3* 23.7* 27.8*   BMET Recent Labs    04/16/18 0418 04/17/18 0348  NA 150* 151*  K 3.9 3.6  CL 120* 120*  CO2 16* 17*  GLUCOSE 87 122*  BUN 61* 68*  CREATININE 1.98* 2.14*  CALCIUM 8.7* 8.8*     Studies/Results: Dg Abd 1 View  Result Date: 04/15/2018 CLINICAL DATA:  Encounter for nasogastric tube placement. EXAM: ABDOMEN - 1 VIEW COMPARISON:  CT abdomen pelvis of April 14, 2018 FINDINGS: Multiple dilated small bowel loops are identified throughout abdomen and pelvis. Nasogastric tube is identified with distal tip in the proximal stomach. IMPRESSION: Nasogastric tube is identified with distal tip in the proximal stomach. Multiple dilated small bowel loops are identified throughout abdomen and pelvis as seen on prior recent CT. Electronically Signed   By: Abelardo Diesel M.D.   On: 04/15/2018 14:31   Dg C-arm 1-60 Min-no Report  Result Date: 04/15/2018 Fluoroscopy was utilized by the requesting physician.  No radiographic interpretation.    Assessment/Plan: 1) Bilateral ureteral obstruction secondary to large pelvic mass with  metastases: Renal function stabilizing.  Will eventually schedule outpatient follow up for ongoing stent management.  2) Pelvic mass/metastatic lesions to bladder: Path pending.    LOS: 2 days   Christapher Gillian,LES 04/17/2018, 5:30 AM

## 2018-04-18 ENCOUNTER — Encounter: Payer: Self-pay | Admitting: Oncology

## 2018-04-18 ENCOUNTER — Encounter (HOSPITAL_COMMUNITY): Payer: Self-pay | Admitting: Surgery

## 2018-04-18 ENCOUNTER — Telehealth: Payer: Self-pay | Admitting: Oncology

## 2018-04-18 ENCOUNTER — Inpatient Hospital Stay (HOSPITAL_COMMUNITY): Payer: PRIVATE HEALTH INSURANCE

## 2018-04-18 DIAGNOSIS — Z515 Encounter for palliative care: Secondary | ICD-10-CM

## 2018-04-18 DIAGNOSIS — Z7189 Other specified counseling: Secondary | ICD-10-CM

## 2018-04-18 DIAGNOSIS — K56609 Unspecified intestinal obstruction, unspecified as to partial versus complete obstruction: Secondary | ICD-10-CM

## 2018-04-18 LAB — BASIC METABOLIC PANEL
Anion gap: 10 (ref 5–15)
BUN: 53 mg/dL — AB (ref 8–23)
CO2: 18 mmol/L — ABNORMAL LOW (ref 22–32)
Calcium: 8 mg/dL — ABNORMAL LOW (ref 8.9–10.3)
Chloride: 119 mmol/L — ABNORMAL HIGH (ref 98–111)
Creatinine, Ser: 2.03 mg/dL — ABNORMAL HIGH (ref 0.61–1.24)
GFR calc Af Amer: 38 mL/min — ABNORMAL LOW (ref >60)
GFR calc non Af Amer: 33 mL/min — ABNORMAL LOW (ref >60)
Glucose, Bld: 119 mg/dL — ABNORMAL HIGH (ref 70–99)
POTASSIUM: 3.2 mmol/L — AB (ref 3.5–5.1)
Sodium: 147 mmol/L — ABNORMAL HIGH (ref 135–145)

## 2018-04-18 LAB — MAGNESIUM: Magnesium: 2.7 mg/dL — ABNORMAL HIGH (ref 1.7–2.4)

## 2018-04-18 LAB — GLUCOSE, CAPILLARY: Glucose-Capillary: 105 mg/dL — ABNORMAL HIGH (ref 70–99)

## 2018-04-18 MED ORDER — POTASSIUM CHLORIDE 10 MEQ/100ML IV SOLN
10.0000 meq | Freq: Once | INTRAVENOUS | Status: AC
  Administered 2018-04-18: 10 meq via INTRAVENOUS

## 2018-04-18 MED ORDER — POTASSIUM CHLORIDE 10 MEQ/100ML IV SOLN
10.0000 meq | INTRAVENOUS | Status: AC
  Administered 2018-04-18 (×4): 10 meq via INTRAVENOUS
  Filled 2018-04-18 (×4): qty 100

## 2018-04-18 MED ORDER — CEFAZOLIN SODIUM-DEXTROSE 2-4 GM/100ML-% IV SOLN
2.0000 g | INTRAVENOUS | Status: AC
  Administered 2018-04-21: 2 g via INTRAVENOUS

## 2018-04-18 NOTE — Plan of Care (Signed)

## 2018-04-18 NOTE — Progress Notes (Signed)
1 Day Post-Op   Subjective/Chief Complaint: Comfortable this morning   Objective: Vital signs in last 24 hours: Temp:  [97.6 F (36.4 C)-98.5 F (36.9 C)] 98.1 F (36.7 C) (01/03 0530) Pulse Rate:  [82-99] 82 (01/03 0530) Resp:  [14-23] 16 (01/03 0530) BP: (118-142)/(70-84) 118/70 (01/03 0530) SpO2:  [95 %-100 %] 99 % (01/03 0530) Last BM Date: 04/16/18  Intake/Output from previous day: 01/02 0701 - 01/03 0700 In: 1724.7 [I.V.:1274.7; IV Piggyback:450] Out: 6767 [Urine:1200; Emesis/NG output:450; Blood:25] Intake/Output this shift: No intake/output data recorded.  Exam: Abdomen much softer, less distended Ostomy already productive, viable  Lab Results:  Recent Labs    04/16/18 0418  WBC 15.7*  HGB 8.6*  HCT 27.8*  PLT 308   BMET Recent Labs    04/17/18 0348 04/18/18 0412  NA 151* 147*  K 3.6 3.2*  CL 120* 119*  CO2 17* 18*  GLUCOSE 122* 119*  BUN 68* 53*  CREATININE 2.14* 2.03*  CALCIUM 8.8* 8.0*   PT/INR No results for input(s): LABPROT, INR in the last 72 hours. ABG No results for input(s): PHART, HCO3 in the last 72 hours.  Invalid input(s): PCO2, PO2  Studies/Results: Dg Abd Portable 1v  Result Date: 04/18/2018 CLINICAL DATA:  Nasogastric tube placement. EXAM: PORTABLE ABDOMEN - 1 VIEW COMPARISON:  Radiograph of April 15, 2018. FINDINGS: Distal tip of nasogastric tube is seen in expected position of proximal stomach. Interval placement of bilateral ureteral stents in grossly good position. Large amount of stool seen throughout the colon. No abnormal bowel dilatation is noted. Plastic tubing is seen over left side of abdomen. This most likely represents overlying external object. IMPRESSION: Nasogastric tube tip seen in proximal stomach. Large stool burden is noted consistent with constipation. Bilateral ureteral stents are noted. Electronically Signed   By: Marijo Conception, M.D.   On: 04/18/2018 07:09    Anti-infectives: Anti-infectives (From  admission, onward)   Start     Dose/Rate Route Frequency Ordered Stop   04/17/18 1046  metroNIDAZOLE (FLAGYL) 5-0.79 MG/ML-% IVPB    Note to Pharmacy:  [name]  : cabinet override      04/17/18 1046 04/17/18 2259   04/17/18 1000  metroNIDAZOLE (FLAGYL) IVPB 500 mg     500 mg 100 mL/hr over 60 Minutes Intravenous  Once 04/16/18 1005 04/17/18 1119   04/15/18 1030  ceFAZolin (ANCEF) IVPB 2g/100 mL premix     2 g 200 mL/hr over 30 Minutes Intravenous On call to O.R. 04/15/18 1018 04/15/18 1647   04/14/18 2230  cefTRIAXone (ROCEPHIN) 1 g in sodium chloride 0.9 % 100 mL IVPB     1 g 200 mL/hr over 30 Minutes Intravenous Daily at bedtime 04/14/18 2218 04/21/18 2159   04/14/18 2230  azithromycin (ZITHROMAX) 500 mg in sodium chloride 0.9 % 250 mL IVPB     500 mg 250 mL/hr over 60 Minutes Intravenous Daily at bedtime 04/14/18 2218 04/21/18 2159      Assessment/Plan: s/p Procedure(s): COLOSTOMY (N/A) EXPLORATORY LAPAROTOMY (N/A)  Abdominal xrays now show much less small bowel dilation.  Still with a large amount of stool in the colon. Clinically, his exam is improved. Will clamp NG and start clear liquids.  If tolerated, will remove NG later today.   LOS: 3 days    [name] 04/18/2018

## 2018-04-18 NOTE — Consult Note (Addendum)
[facility] Nurse ostomy follow up Pt and wife participated in pouch change demonstration and asked appropriate questions.  Colostomy surgery performed yesterday. Pt feeling poorly and has NG tube in place. Stoma type/location: Stoma to LLQ Stomal assessment/size: Red and edematous, above skin level, 2 inches, seeping mod amt yellow drainage.  Red rubber rod in place underneath stoma. Peristomal assessment: Intact skin surrounding Output: Mod amt brown unformed stool  Ostomy pouching: 2pc.   Education provided:  Demonstrated pouch change process and wife videotaped the procedure.  She was able to open and close velcro to empty.  Educational materials left at the bedside and discussed ordering supplies and pouching routines.  5 sets of pouches and wafers left in the room for staff nurse use.  [employer] team is not available during the weekend, but will perform another teaching session on Monday. Enrolled patient in [place] Start Discharge program: Yes Julien Girt MSN, RN, [place], [place], [place]

## 2018-04-18 NOTE — Consult Note (Signed)
Chief Complaint: Patient was seen in consultation today for Port-A-Cath placement Chief Complaint  Patient presents with  . Weight Loss  . Abdominal Pain  . Abnormal Lab    Referring Physician(s): Ortiz,A/Sherrill,B  Supervising Physician: Jacqulynn Cadet  Patient Status: [facility] - In-pt  History of Present Illness: Miguel Gamble is a 68 y.o. male with history of newly diagnosed metastatic colon carcinoma along with partial small bowel obstruction/bladder masses/hydronephrosis /acute renal failure/liver lesions/pulmonary nodules/extensive lymphadenopathy, status post bilateral ureteral stent placement and TURBT 12/31 as well as diverting transverse loop colostomy on 04/17/2018.  He is also on empiric antibiotic therapy for probable community-acquired pneumonia.  Request received for Port-A-Cath placement next week for planned chemotherapy.  History reviewed. No pertinent past medical history.  Past Surgical History:  Procedure Laterality Date  . COLOSTOMY N/A 04/17/2018   Procedure: COLOSTOMY;  Surgeon: [name], MD;  Location: [facility];  Service: General;  Laterality: N/A;  . CYSTOSCOPY W/ URETERAL STENT PLACEMENT Bilateral 04/15/2018   Procedure: CYSTOSCOPY BILATERAL WITH RETROGRADE PYELOGRAM/URETERAL BILATERAL STENT PLACEMENT WITH TRANSURETHRAL RESECTION OF BLADDER TUMOR;  Surgeon: Raynelle Bring, MD;  Location: [facility];  Service: Urology;  Laterality: Bilateral;  . LAPAROTOMY N/A 04/17/2018   Procedure: EXPLORATORY LAPAROTOMY;  Surgeon: [name], MD;  Location: [facility];  Service: General;  Laterality: N/A;    Allergies: Other  Medications: Prior to Admission medications   Medication Sig Start Date End Date Taking? Authorizing Provider  aspirin 325 MG EC tablet Take 325 mg by mouth every 6 (six) hours as needed for pain.   Yes [provider]     Family History  Problem Relation Age of Onset  . Lung cancer Mother   . Emphysema Father   . Heart  failure Father     Social History   Socioeconomic History  . Marital status: Married    Spouse name: Not on file  . Number of children: Not on file  . Years of education: Not on file  . Highest education level: Not on file  Occupational History  . Not on file  Social Needs  . Financial resource strain: Not on file  . Food insecurity:    Worry: Not on file    Inability: Not on file  . Transportation needs:    Medical: Not on file    Non-medical: Not on file  Tobacco Use  . Smoking status: Never Smoker  . Smokeless tobacco: Never Used  Substance and Sexual Activity  . Alcohol use: Not on file  . Drug use: Not on file  . Sexual activity: Not on file  Lifestyle  . Physical activity:    Days per week: Not on file    Minutes per session: Not on file  . Stress: Not on file  Relationships  . Social connections:    Talks on phone: Not on file    Gets together: Not on file    Attends religious service: Not on file    Active member of club or organization: Not on file    Attends meetings of clubs or organizations: Not on file    Relationship status: Not on file  Other Topics Concern  . Not on file  Social History Narrative  . Not on file      Review of Systems currently denies fever, headache, chest pain, worsening dyspnea, cough, abdominal/back pain, nausea, vomiting or bleeding.  He has had weight loss.  Vital Signs: BP 118/70 (BP Location: Right Arm)   Pulse 82  Temp 98.1 F (36.7 C) (Oral)   Resp (!) 21   Ht 5\' 8"  (1.727 m)   Wt 132 lb 7.9 oz (60.1 kg)   SpO2 98%   BMI 20.15 kg/m   Physical Exam awake, alert.  Chest clear to auscultation bilaterally.  Heart with regular rate and rhythm.  Abdomen soft, minimal distention, ostomy intact, slightly tender to palpation, few bowel sounds; no lower extremity edema  Imaging: Ct Abdomen Pelvis Wo Contrast  Result Date: 04/14/2018 CLINICAL DATA:  Chronic weight loss. Abnormal radiograph. Further evaluation  requested. EXAM: CT CHEST, ABDOMEN AND PELVIS WITHOUT CONTRAST TECHNIQUE: Multidetector CT imaging of the chest, abdomen and pelvis was performed following the standard protocol without IV contrast. COMPARISON:  None. FINDINGS: CT CHEST FINDINGS Cardiovascular: The heart is normal in size. Mild calcification is noted at the aortic arch. The great vessels are unremarkable in appearance. Mediastinum/Nodes: The esophagus is filled with contrast, which may reflect gastroesophageal reflux or esophageal dysmotility. Wall thickening along the esophagus may reflect esophagitis or possibly metastatic disease. Mild achalasia cannot be excluded. Mild coronary artery calcification is seen. An enlarged 1.8 cm precarinal node is suspicious for metastatic disease. A trace pericardial effusion is noted. The visualized portions of the thyroid gland are unremarkable. No axillary lymphadenopathy is seen. Lungs/Pleura: Patchy bilateral airspace opacities are noted, predominantly at the right upper lobe and left lower lobe, concerning for pneumonia. Lymphangitic spread of tumor could conceivably have a similar appearance. Multiple large pulmonary nodules are noted bilaterally, measuring up to 1.7 cm in size, compatible with metastatic disease. No pleural effusion or pneumothorax is seen. Musculoskeletal: No acute osseous abnormalities are identified. The visualized musculature is unremarkable in appearance. CT ABDOMEN PELVIS FINDINGS Hepatobiliary: Vague lesions within the liver measure up to 3.5 cm in size, compatible with metastatic disease. The gallbladder is not definitely characterized. The common bile duct is normal in caliber. Pancreas: The pancreas is within normal limits. Peripancreatic nodes measure up to 1.4 cm in short axis, concerning for metastatic disease. Spleen: The spleen is diminutive and unremarkable in appearance. Adrenals/Urinary Tract: The adrenal glands are grossly unremarkable in appearance. Scattered bilateral  renal cysts are noted, more prominent on the right. There is mild chronic left-sided hydronephrosis, with prominence of the proximal left ureter to the level of the lower abdomen, reflecting obstruction by the large lower abdominal and pelvic mass. No renal or ureteral stones are identified. Mild left-sided perinephric stranding is noted. Stomach/Bowel: There is diffuse dilatation of small-bowel loops to 6.0 cm in diameter, and distention of the cecum to 12.0 cm in diameter. The small and large bowel are diffusely dilated. The stomach is largely filled with contrast. Distention extends to the level of the proximal sigmoid colon. 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 Wall thickening is noted along the adjacent small and large bowel loops, and the mass measures approximately 16 x 9 cm. Wall thickening extends to the rectum, with diffuse presacral stranding. This most likely arises from the sigmoid colon, though a small bowel or bladder source is also possible. Vascular/Lymphatic: Scattered calcification is seen along the abdominal aorta and its branches. The abdominal aorta is otherwise grossly unremarkable. The inferior vena cava is grossly unremarkable. Scattered enlarged mesenteric and retroperitoneal nodes are seen, measuring up to 1.5 cm in short axis.  Vague mass tracks directly anterior to the aortic bifurcation and along the pelvic sidewall bilaterally. A 1.9 cm node is seen along the left external iliac vessels. Reproductive: Multiple masses are noted within the bladder, with diffuse irregular bladder wall thickening at the dome of the bladder, reflecting diffuse spread of disease. The prostate is borderline enlarged, measuring 4.9 cm in transverse dimension. Other: Trace fluid is noted at a small right inguinal hernia.  Musculoskeletal: No acute osseous abnormalities are identified. The visualized musculature is unremarkable in appearance. IMPRESSION: 1. 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 The mass measures 16 x 9 cm. Wall thickening extends along the adjacent small and large bowel loops. 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 2. Marked diffuse dilatation of the small and large bowel loops, reflecting partial bowel obstruction by the mass. 3. Extensive retroperitoneal, mesenteric and pelvic sidewall metastatic lymphadenopathy noted. Enlarged mediastinal node also noted, concerning for metastatic disease. 4. Multiple pulmonary nodules noted bilaterally, compatible with metastatic disease. 5. Wall thickening along the esophagus may reflect esophagitis or possibly metastatic disease. Esophagus filled with contrast, possibly reflecting gastroesophageal reflux or esophageal dysmotility. Mild achalasia cannot be excluded. 6. Mild chronic left-sided hydronephrosis, reflecting obstruction by the large lower abdominal and pelvic mass. 7. Multiple masses within the bladder, with diffuse irregular bladder wall thickening at the dome of the bladder, reflecting diffuse spread of malignancy. 8. Scattered hepatic metastases noted. 9. Patchy bilateral airspace opacities, predominantly at the right upper lobe and left lower lobe, concerning for pneumonia. Lymphangitic spread of tumor could conceivably have a similar appearance. 10. Bilateral renal cysts. 11. Trace pericardial effusion noted. Aortic Atherosclerosis (ICD10-I70.0). These results were called by telephone at the time of interpretation on 04/14/2018 at 9:41 pm to [facility] PA, who verbally acknowledged these results.  Electronically Signed   By: Garald Balding M.D.   On: 04/14/2018 21:48   Dg Abd 1 View  Result Date: 04/15/2018 CLINICAL DATA:  Encounter for nasogastric tube placement. EXAM: ABDOMEN - 1 VIEW COMPARISON:  CT abdomen pelvis of April 14, 2018 FINDINGS: Multiple dilated small bowel loops are identified throughout abdomen and pelvis. Nasogastric tube is identified with distal tip in the proximal stomach. IMPRESSION: Nasogastric tube is identified with distal tip in the proximal stomach. Multiple dilated small bowel loops are identified throughout abdomen and pelvis as seen on prior recent CT. Electronically Signed   By: Abelardo Diesel M.D.   On: 04/15/2018 14:31   Ct Chest Wo Contrast  Result Date: 04/14/2018 CLINICAL DATA:  Chronic weight loss. Abnormal radiograph. Further evaluation requested. EXAM: CT CHEST, ABDOMEN AND PELVIS WITHOUT CONTRAST TECHNIQUE: Multidetector CT imaging of the chest, abdomen and pelvis was performed following the standard protocol without IV contrast. COMPARISON:  None. FINDINGS: CT CHEST FINDINGS Cardiovascular: The heart is normal in size. Mild calcification is noted at the aortic arch. The great vessels are unremarkable in appearance. Mediastinum/Nodes: The esophagus is filled with contrast, which may reflect gastroesophageal reflux or esophageal dysmotility. Wall thickening along the esophagus may reflect esophagitis or possibly metastatic disease. Mild achalasia cannot be excluded. Mild coronary artery calcification is seen. An enlarged 1.8 cm precarinal node is suspicious for metastatic disease. A trace pericardial effusion is noted. The visualized portions of the  thyroid gland are unremarkable. No axillary lymphadenopathy is seen. Lungs/Pleura: Patchy bilateral airspace opacities are noted, predominantly at the right upper lobe and left lower lobe, concerning for pneumonia. Lymphangitic spread of tumor could conceivably have a similar appearance. Multiple large pulmonary  nodules are noted bilaterally, measuring up to 1.7 cm in size, compatible with metastatic disease. No pleural effusion or pneumothorax is seen. Musculoskeletal: No acute osseous abnormalities are identified. The visualized musculature is unremarkable in appearance. CT ABDOMEN PELVIS FINDINGS Hepatobiliary: Vague lesions within the liver measure up to 3.5 cm in size, compatible with metastatic disease. The gallbladder is not definitely characterized. The common bile duct is normal in caliber. Pancreas: The pancreas is within normal limits. Peripancreatic nodes measure up to 1.4 cm in short axis, concerning for metastatic disease. Spleen: The spleen is diminutive and unremarkable in appearance. Adrenals/Urinary Tract: The adrenal glands are grossly unremarkable in appearance. Scattered bilateral renal cysts are noted, more prominent on the right. There is mild chronic left-sided hydronephrosis, with prominence of the proximal left ureter to the level of the lower abdomen, reflecting obstruction by the large lower abdominal and pelvic mass. No renal or ureteral stones are identified. Mild left-sided perinephric stranding is noted. Stomach/Bowel: There is diffuse dilatation of small-bowel loops to 6.0 cm in diameter, and distention of the cecum to 12.0 cm in diameter. The small and large bowel are diffusely dilated. The stomach is largely filled with contrast. Distention extends to the level of the proximal sigmoid colon. 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 Wall thickening is noted along the adjacent small and large bowel loops, and the mass measures approximately 16 x 9 cm. Wall thickening extends to the rectum, with diffuse presacral stranding. This most likely arises from the sigmoid colon, though a small bowel or bladder source is  also possible. Vascular/Lymphatic: Scattered calcification is seen along the abdominal aorta and its branches. The abdominal aorta is otherwise grossly unremarkable. The inferior vena cava is grossly unremarkable. Scattered enlarged mesenteric and retroperitoneal nodes are seen, measuring up to 1.5 cm in short axis. Vague mass tracks directly anterior to the aortic bifurcation and along the pelvic sidewall bilaterally. A 1.9 cm node is seen along the left external iliac vessels. Reproductive: Multiple masses are noted within the bladder, with diffuse irregular bladder wall thickening at the dome of the bladder, reflecting diffuse spread of disease. The prostate is borderline enlarged, measuring 4.9 cm in transverse dimension. Other: Trace fluid is noted at a small right inguinal hernia. Musculoskeletal: No acute osseous abnormalities are identified. The visualized musculature is unremarkable in appearance. IMPRESSION: 1. 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 The mass measures 16 x 9 cm. Wall thickening extends along the adjacent small and large bowel loops. 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 2. Marked diffuse dilatation of the small and large bowel loops, reflecting partial bowel obstruction by the mass. 3. Extensive retroperitoneal, mesenteric and pelvic sidewall metastatic lymphadenopathy noted. Enlarged mediastinal node also noted, concerning for metastatic disease. 4. Multiple pulmonary nodules noted bilaterally, compatible with metastatic  disease. 5. Wall thickening along the esophagus may reflect esophagitis or possibly metastatic disease. Esophagus filled with contrast, possibly reflecting gastroesophageal reflux or esophageal dysmotility. Mild achalasia  cannot be excluded. 6. Mild chronic left-sided hydronephrosis, reflecting obstruction by the large lower abdominal and pelvic mass. 7. Multiple masses within the bladder, with diffuse irregular bladder wall thickening at the dome of the bladder, reflecting diffuse spread of malignancy. 8. Scattered hepatic metastases noted. 9. Patchy bilateral airspace opacities, predominantly at the right upper lobe and left lower lobe, concerning for pneumonia. Lymphangitic spread of tumor could conceivably have a similar appearance. 10. Bilateral renal cysts. 11. Trace pericardial effusion noted. Aortic Atherosclerosis (ICD10-I70.0). These results were called by telephone at the time of interpretation on 04/14/2018 at 9:41 pm to [facility] PA, who verbally acknowledged these results. Electronically Signed   By: Garald Balding M.D.   On: 04/14/2018 21:48   Dg Abd Portable 1v  Result Date: 04/18/2018 CLINICAL DATA:  Nasogastric tube placement. EXAM: PORTABLE ABDOMEN - 1 VIEW COMPARISON:  Radiograph of April 15, 2018. FINDINGS: Distal tip of nasogastric tube is seen in expected position of proximal stomach. Interval placement of bilateral ureteral stents in grossly good position. Large amount of stool seen throughout the colon. No abnormal bowel dilatation is noted. Plastic tubing is seen over left side of abdomen. This most likely represents overlying external object. IMPRESSION: Nasogastric tube tip seen in proximal stomach. Large stool burden is noted consistent with constipation. Bilateral ureteral stents are noted. Electronically Signed   By: Marijo Conception, M.D.   On: 04/18/2018 07:09   Dg C-arm 1-60 Min-no Report  Result Date: 04/15/2018 Fluoroscopy was utilized by the requesting physician.  No radiographic interpretation.    Labs:  CBC: Recent Labs    04/14/18 1801 04/15/18 0603 04/16/18 0418  WBC 20.3* 16.1* 15.7*  HGB 8.9* 7.5* 8.6*  HCT 29.3* 23.7* 27.8*  PLT 315 311 308    COAGS: No  results for input(s): INR, APTT in the last 8760 hours.  BMP: Recent Labs    04/15/18 0603 04/16/18 0418 04/17/18 0348 04/18/18 0412  NA 145 150* 151* 147*  K 3.2* 3.9 3.6 3.2*  CL 115* 120* 120* 119*  CO2 17* 16* 17* 18*  GLUCOSE 99 87 122* 119*  BUN 76* 61* 68* 53*  CALCIUM 8.3* 8.7* 8.8* 8.0*  CREATININE 2.24* 1.98* 2.14* 2.03*  GFRNONAA 29* 34* 31* 33*  GFRAA 34* 39* 36* 38*    LIVER FUNCTION TESTS: Recent Labs    04/14/18 1801 04/16/18 0418  BILITOT 1.0 0.8  AST 47* 30  ALT 26 24  ALKPHOS 83 72  PROT 7.3 6.4*  ALBUMIN 3.4* 2.7*    TUMOR MARKERS: No results for input(s): AFPTM, CEA, CA199, CHROMGRNA in the last 8760 hours.  Assessment and Plan: 68 y.o. male with history of newly diagnosed metastatic colon carcinoma along with partial small bowel obstruction/bladder masses/hydronephrosis /acute renal failure/liver lesions/pulmonary nodules/extensive lymphadenopathy, status post bilateral ureteral stent placement and TURBT 12/31 as well as diverting transverse loop colostomy on 04/17/2018.  He is also on empiric antibiotic therapy for probable community-acquired pneumonia.  Request received for Port-A-Cath placement next week for planned chemotherapy.  Patient currently afebrile, WBC 15.7 on 1/1. Risks and benefits of image guided port-a-catheter placement was discussed with the patient/spouse including, but not limited to bleeding, infection, pneumothorax, or fibrin sheath development and need for additional procedures.  All of the patient's questions were answered, patient is agreeable to proceed. Consent  signed and in chart.  Will tentatively plan case for 1/6 if patient remains afebrile with improving WBC and stable hemoglobin    Thank you for this interesting consult.  I greatly enjoyed meeting Miguel Gamble and look forward to participating in their care.  A copy of this report was sent to the requesting provider on this date.  Electronically Signed: D. Rowe Robert, PA-C 04/18/2018, 3:52 PM   I spent a total of 25 minutes   in face to face in clinical consultation, greater than 50% of which was counseling/coordinating care for port a cath placement

## 2018-04-18 NOTE — Progress Notes (Signed)
[facility] PROGRESS NOTE    Progress Note  Miguel Gamble  QHU:[id number] DOB: 21-Aug-1950 DOA: 04/14/2018 PCP: Miguel Low, MD     Brief Narrative:   Miguel Gamble is an 68 y.o. 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 Patient is status post cystoscopy with bilateral retrograde pyelogram with bilateral stent placement and transurethral resection of bladder tumor Diet per surgery Status post diverting colostomy on 04/17/2018, NG tube in place. CEA was 208, pathology report of biopsy showed colonic adenocarcinoma poorly differentiated. We will have to place a Port-A-Cath by IR to be done on 1 09/16/2018  Pelvic mass with metastases and hydronephrosis leading to acute renal failure: Creatinine on admission 2.4. Most likely postobstructive. Urology was consulted who performed cystoscopy with bilateral stent placement creatinine is slowly improving we will continue IV fluids but will change him to D5W. Creatinine is slowly improving.  Hypernatremia: With IV fluid hydration.  Leukocytosis: Likely due to above has remained afebrile.  Probable community-acquired pneumonia: Lung nodules on CT versus lymphangitic spread Culture data is pending. He was started empirically on Rocephin and azithromycin urine antigens are negative. Has remained afebrile with no leukocytosis we will have a Gamble threshold to discontinue antibiotics.  Normocytic anemia HemoGlobin today is 8.6.  Hypokalemia: Likely due to NG tube, replete IV.  Check a magnesium level.  DVT  prophylaxis: lovenxo Family Communication:wife Disposition Plan/Barrier to D/C: Once able to tolerate orals. Code Status:     Code Status Orders  (From admission, onward)         Start     Ordered   04/14/18 2216  Full code  Continuous     04/14/18 2218        Code Status History    This patient has a current code status but no historical code status.        IV Access:    Peripheral IV   Procedures and diagnostic studies:   Dg Abd Portable 1v  Result Date: 04/18/2018 CLINICAL DATA:  Nasogastric tube placement. EXAM: PORTABLE ABDOMEN - 1 VIEW COMPARISON:  Radiograph of April 15, 2018. FINDINGS: Distal tip of nasogastric tube is seen in expected position of proximal stomach. Interval placement of bilateral ureteral stents in grossly good position. Large amount of stool seen throughout the colon. No abnormal bowel dilatation is noted. Plastic tubing is seen over left side of abdomen. This most likely represents overlying external object. IMPRESSION: Nasogastric tube tip seen in proximal stomach. Large stool burden is noted consistent with constipation. Bilateral ureteral stents are noted. Electronically Signed   By: Marijo Conception, M.D.   On: 04/18/2018 07:09     Medical Consultants:    None.  Anti-Infectives:   Rocephin  Subjective:    Miguel Gamble tolerating eyes at bloody bowel movement and the bag was full of air.  Objective:    Vitals:   04/17/18 2149 04/18/18 0010 04/18/18 0422 04/18/18 0530  BP: 132/79   118/70  Pulse: 88   82  Resp: 18 16 14 16   Temp: 98.5 F (36.9 C)   98.1 F (36.7 C)  TempSrc: Oral  Oral  SpO2: 100% 100% 98% 99%  Weight:      Height:        Intake/Output Summary (Last 24 hours) at 04/18/2018 0919 Last data filed at 04/18/2018 0554 Gross per 24 hour  Intake 1724.71 ml  Output 1625 ml  Net 99.71 ml   Filed Weights   04/15/18 0523 04/15/18 1532 04/17/18 0539  Weight: 59.7 kg 59.7 kg 60.1 kg    Exam: General  exam: In no acute distress. Respiratory system: Air movement and clear to auscultation. Cardiovascular system: Rate and rhythm positive S1-S2 Gastrointestinal system: Bowel sounds soft nontender nondistended Central nervous system: Alert and oriented. No focal neurological deficits. Extremities: No pedal edema. Skin: No rashes, lesions or ulcers Psychiatry: Judgement and insight appear normal. Mood & affect appropriate.    Data Reviewed:    Labs: Basic Metabolic Panel: Recent Labs  Lab 04/14/18 1801 04/15/18 0603 04/16/18 0418 04/17/18 0348 04/18/18 0412  NA 144 145 150* 151* 147*  K 4.0 3.2* 3.9 3.6 3.2*  CL 111 115* 120* 120* 119*  CO2 18* 17* 16* 17* 18*  GLUCOSE 125* 99 87 122* 119*  BUN 75* 76* 61* 68* 53*  CREATININE 2.83* 2.24* 1.98* 2.14* 2.03*  CALCIUM 9.0 8.3* 8.7* 8.8* 8.0*  MG  --   --  2.7*  --   --    GFR Estimated Creatinine Clearance: 29.6 mL/min (A) (by C-G formula based on SCr of 2.03 mg/dL (H)). Liver Function Tests: Recent Labs  Lab 04/14/18 1801 04/16/18 0418  AST 47* 30  ALT 26 24  ALKPHOS 83 72  BILITOT 1.0 0.8  PROT 7.3 6.4*  ALBUMIN 3.4* 2.7*   Recent Labs  Lab 04/14/18 1801  LIPASE 23   No results for input(s): AMMONIA in the last 168 hours. Coagulation profile No results for input(s): INR, PROTIME in the last 168 hours.  CBC: Recent Labs  Lab 04/14/18 1801 04/15/18 0603 04/16/18 0418  WBC 20.3* 16.1* 15.7*  NEUTROABS  --  14.6* 14.4*  HGB 8.9* 7.5* 8.6*  HCT 29.3* 23.7* 27.8*  MCV 96.1 90.5 94.9  PLT 315 311 308   Cardiac Enzymes: No results for input(s): CKTOTAL, CKMB, CKMBINDEX, TROPONINI in the last 168 hours. BNP (last 3 results) No results for input(s): PROBNP in the last 8760 hours. CBG: Recent Labs  Lab 04/15/18 0952 04/16/18 0759 04/17/18 0741 04/18/18 0810  GLUCAP 85 76 135* 105*   D-Dimer: No results for input(s): DDIMER in the last 72 hours. Hgb A1c: No results for input(s): HGBA1C in the last 72  hours. Lipid Profile: No results for input(s): CHOL, HDL, LDLCALC, TRIG, CHOLHDL, LDLDIRECT in the last 72 hours. Thyroid function studies: No results for input(s): TSH, T4TOTAL, T3FREE, THYROIDAB in the last 72 hours.  Invalid input(s): FREET3 Anemia work up: No results for input(s): VITAMINB12, FOLATE, FERRITIN, TIBC, IRON, RETICCTPCT in the last 72 hours. Sepsis Labs: Recent Labs  Lab 04/14/18 1801 04/14/18 1808 04/14/18 2009 04/15/18 0603 04/16/18 0418  WBC 20.3*  --   --  16.1* 15.7*  LATICACIDVEN  --  3.65* 1.86  --   --    Microbiology Recent Results (from the past 240 hour(s))  Culture, Urine     Status: None   Collection Time: 04/15/18  6:15 PM  Result Value Ref Range Status   Specimen Description   Final    URINE, CLEAN CATCH Performed at [facility], [place] [address] [place], [place] [address]    Special  Requests   Final    NONE Performed at [facility], [place] [address], [place], [place] [address]    Culture   Final    NO GROWTH Performed at [facility], [place] [address] [place], [place] [address]    Report Status 04/17/2018 FINAL  Final  Surgical pcr screen     Status: Abnormal   Collection Time: 04/17/18  2:18 AM  Result Value Ref Range Status   MRSA, PCR NEGATIVE NEGATIVE Final   Staphylococcus aureus POSITIVE (A) NEGATIVE Final    Comment: (NOTE) The Xpert SA Assay (FDA approved for NASAL specimens in patients 34 years of age and older), is one component of a comprehensive surveillance program. It is not intended to diagnose infection nor to guide or monitor treatment. Performed at [facility], [place] [address], [place], [place] [address]      Medications:   . Chlorhexidine Gluconate Cloth  6 each Topical Q0600  . morphine   Intravenous Q4H  . mupirocin ointment   Nasal BID   Continuous Infusions: . sodium chloride 10 mL/hr at 04/16/18 1231  . azithromycin 500 mg (04/17/18 2221)    . cefTRIAXone (ROCEPHIN)  IV 1 g (04/17/18 2118)  . dextrose 5 % and 0.45% NaCl 75 mL/hr at 04/17/18 1800  . potassium chloride         LOS: 3 days   Charlynne Cousins  [facility]   *Please refer to [email or web address], password [email or web address] to get updated schedule on who will round on this patient, as hospitalists switch teams weekly. If 7PM-7AM, please contact night-coverage at [email or web address], password [email or web address] for any overnight needs.  04/18/2018, 9:19 AM

## 2018-04-18 NOTE — Evaluation (Signed)
Physical Therapy Evaluation Patient Details Name: Miguel Gamble MRN: [id number] DOB: 07-19-1950 Today's Date: 04/18/2018   History of Present Illness  Miguel Gamble is a 68 y.o. male who denies any significant past medical history admitted through [facility] with several months of progressive abdominal discomfort, weight loss, decr appetit; pt found to have extensive tumor with mets; s/p  Bilateral ureteral stent placement, with TURBT  on 12/31 per Dr. Alinda Money,  s/p Diverting Loop Colostomy per Dr. Ninfa Linden on 04/17/18;    Clinical Impression  Pt admitted with above diagnosis. Pt currently with functional limitations due to the deficits listed below (see PT Problem List).  Pt is quite independent at his baseline, currently weaker and fatigues easily; should continue to progress well, recommend pt amb with nursing staff, will likely be able to amb with family after PCA  D/c'd; do not feel pt will need PT f/u but we will follow in acute setting;  Pt will benefit from skilled PT to increase their independence and safety with mobility to allow discharge to the venue listed below.       Follow Up Recommendations No PT follow up    Equipment Recommendations  None recommended by PT    Recommendations for Other Services       Precautions / Restrictions Precautions Precautions: Fall Precaution Comments: colostomy      Mobility  Bed Mobility               General bed mobility comments: NT, pt on window seat on arrival  Transfers Overall transfer level: Needs assistance   Transfers: Sit to/from Stand Sit to Stand: Supervision;Min guard         General transfer comment: for multiple lines and for safety  Ambulation/Gait Ambulation/Gait assistance: Min guard;Min assist Gait Distance (Feet): 80 Feet Assistive device: None Gait Pattern/deviations: Wide base of support;Step-through pattern;Decreased stride length Gait velocity: decr   General Gait Details: min to min/guard for  stability, unsteady but no overt LOB, fatigues easily  Stairs            Wheelchair Mobility    Modified Rankin (Stroke Patients Only)       Balance Overall balance assessment: Needs assistance Sitting-balance support: No upper extremity supported;Feet supported Sitting balance-Leahy Scale: Fair       Standing balance-Leahy Scale: Fair                               Pertinent Vitals/Pain Pain Assessment: No/denies pain    Home Living Family/patient expects to be discharged to:: Private residence Living Arrangements: Spouse/significant other Available Help at Discharge: Family Type of Home: House Home Access: Stairs to enter   Technical brewer of Steps: 3-4 Home Layout: One level Home Equipment: None      Prior Function Level of Independence: Independent               Hand Dominance        Extremity/Trunk Assessment   Upper Extremity Assessment Upper Extremity Assessment: Overall WFL for tasks assessed    Lower Extremity Assessment Lower Extremity Assessment: Generalized weakness       Communication   Communication: No difficulties  Cognition Arousal/Alertness: Awake/alert Behavior During Therapy: WFL for tasks assessed/performed Overall Cognitive Status: Within Functional Limits for tasks assessed  General Comments      Exercises     Assessment/Plan    PT Assessment Patient needs continued PT services  PT Problem List Decreased strength;Decreased mobility;Decreased activity tolerance;Decreased balance       PT Treatment Interventions DME instruction;Gait training;Functional mobility training;Therapeutic activities;Patient/family education;Therapeutic exercise    PT Goals (Current goals can be found in the Care Plan section)  Acute Rehab PT Goals Patient Stated Goal: to go home, get stronger PT Goal Formulation: With patient Time For Goal Achievement:  04/25/18 Potential to Achieve Goals: Good    Frequency Min 3X/week   Barriers to discharge        Co-evaluation               AM-PAC PT "6 Clicks" Mobility  Outcome Measure Help needed turning from your back to your side while in a flat bed without using bedrails?: A Little Help needed moving from lying on your back to sitting on the side of a flat bed without using bedrails?: A Little Help needed moving to and from a bed to a chair (including a wheelchair)?: A Little Help needed standing up from a chair using your arms (e.g., wheelchair or bedside chair)?: A Little Help needed to walk in hospital room?: A Little Help needed climbing 3-5 steps with a railing? : A Little 6 Click Score: 18    End of Session   Activity Tolerance: Patient tolerated treatment well;Patient limited by fatigue Patient left: in chair;with call bell/phone within reach;with family/visitor present   PT Visit Diagnosis: Unsteadiness on feet (R26.81)    Time: 1610-9604 PT Time Calculation (min) (ACUTE ONLY): 21 min   Charges:   PT Evaluation $PT Eval Low Complexity: 1 Low          Kenyon Ana, PT  Pager: [phone] Acute Rehab Dept [facility]): [phone]   04/18/2018   [facility] 04/18/2018, 3:50 PM

## 2018-04-18 NOTE — Telephone Encounter (Signed)
A new patient appt has been scheduled for the pt to see Dr. Benay Spice on 1/15 at 2pm. Letter mailed.

## 2018-04-18 NOTE — Progress Notes (Signed)
Patient ID: Miguel Gamble, male   DOB: [birth year], 68 y.o.   MRN: [id number]  1 Day Post-Op Subjective: Pt s/p transverse colon loop diverting colostomy yesterday.  Urgency and frequency of urination improving.  Objective: Vital signs in last 24 hours: Temp:  [97.6 F (36.4 C)-98.5 F (36.9 C)] 98.1 F (36.7 C) (01/03 0530) Pulse Rate:  [82-99] 82 (01/03 0530) Resp:  [14-23] 16 (01/03 0530) BP: (118-142)/(70-84) 118/70 (01/03 0530) SpO2:  [95 %-100 %] 99 % (01/03 0530)  Intake/Output from previous day: 01/02 0701 - 01/03 0700 In: 1724.7 [I.V.:1274.7; IV Piggyback:450] Out: 0630 [Urine:1200; Emesis/NG output:450; Blood:25] Intake/Output this shift: No intake/output data recorded.  Physical Exam:  General: Alert and oriented   Lab Results: Recent Labs    04/16/18 0418  HGB 8.6*  HCT 27.8*   BMET Recent Labs    04/17/18 0348 04/18/18 0412  NA 151* 147*  K 3.6 3.2*  CL 120* 119*  CO2 17* 18*  GLUCOSE 122* 119*  BUN 68* 53*  CREATININE 2.14* 2.03*  CALCIUM 8.8* 8.0*     Studies/Results: Pathology: Adenocarcinoma consistent with colon primary  Assessment/Plan: 1) Bladder masses: Pathology indicates metastatic adenocarcinoma consistent with colon primary.  Further systemic therapy per Dr. Learta Gamble.  Urinary symptoms should continue to improve as he heals from TURBT.  2) Bilateral ureteral obstruction/AKI: Cr stabilized at 2.0 indicating some level of CKD.  Will continue with stent management and I will arrange outpatient follow up in about 2 months to reassess and to likely plan for ureteral stent change in about 3 months or so.  Will sign off.  Please call if further urologic questions.   LOS: 3 days   Miguel Gamble 04/18/2018, 9:37 AM

## 2018-04-19 LAB — CBC WITH DIFFERENTIAL/PLATELET
Abs Immature Granulocytes: 0.06 10*3/uL (ref 0.00–0.07)
Basophils Absolute: 0 10*3/uL (ref 0.0–0.1)
Basophils Relative: 0 %
EOS PCT: 1 %
Eosinophils Absolute: 0.1 10*3/uL (ref 0.0–0.5)
HCT: 25.4 % — ABNORMAL LOW (ref 39.0–52.0)
HEMOGLOBIN: 7.4 g/dL — AB (ref 13.0–17.0)
Immature Granulocytes: 1 %
LYMPHS PCT: 7 %
Lymphs Abs: 0.7 10*3/uL (ref 0.7–4.0)
MCH: 28.8 pg (ref 26.0–34.0)
MCHC: 29.1 g/dL — ABNORMAL LOW (ref 30.0–36.0)
MCV: 98.8 fL (ref 80.0–100.0)
Monocytes Absolute: 0.7 10*3/uL (ref 0.1–1.0)
Monocytes Relative: 6 %
NEUTROS PCT: 85 %
Neutro Abs: 8.5 10*3/uL — ABNORMAL HIGH (ref 1.7–7.7)
PLATELETS: 263 10*3/uL (ref 150–400)
RBC: 2.57 MIL/uL — ABNORMAL LOW (ref 4.22–5.81)
RDW: 15.2 % (ref 11.5–15.5)
WBC: 10.1 10*3/uL (ref 4.0–10.5)
nRBC: 0 % (ref 0.0–0.2)

## 2018-04-19 LAB — BASIC METABOLIC PANEL
Anion gap: 10 (ref 5–15)
BUN: 42 mg/dL — ABNORMAL HIGH (ref 8–23)
CO2: 18 mmol/L — ABNORMAL LOW (ref 22–32)
Calcium: 7.9 mg/dL — ABNORMAL LOW (ref 8.9–10.3)
Chloride: 120 mmol/L — ABNORMAL HIGH (ref 98–111)
Creatinine, Ser: 1.72 mg/dL — ABNORMAL HIGH (ref 0.61–1.24)
GFR calc Af Amer: 46 mL/min — ABNORMAL LOW (ref >60)
GFR calc non Af Amer: 40 mL/min — ABNORMAL LOW (ref >60)
Glucose, Bld: 108 mg/dL — ABNORMAL HIGH (ref 70–99)
POTASSIUM: 2.7 mmol/L — AB (ref 3.5–5.1)
SODIUM: 148 mmol/L — AB (ref 135–145)

## 2018-04-19 LAB — GLUCOSE, CAPILLARY: Glucose-Capillary: 101 mg/dL — ABNORMAL HIGH (ref 70–99)

## 2018-04-19 MED ORDER — POTASSIUM CHLORIDE 20 MEQ/15ML (10%) PO SOLN
40.0000 meq | Freq: Once | ORAL | Status: AC
  Administered 2018-04-19: 40 meq via ORAL
  Filled 2018-04-19: qty 30

## 2018-04-19 MED ORDER — POTASSIUM CHLORIDE CRYS ER 20 MEQ PO TBCR
40.0000 meq | EXTENDED_RELEASE_TABLET | Freq: Once | ORAL | Status: AC
  Administered 2018-04-19: 40 meq via ORAL
  Filled 2018-04-19 (×2): qty 2

## 2018-04-19 MED ORDER — OXYCODONE HCL 5 MG PO TABS: 5.0000 mg | ORAL_TABLET | ORAL | Status: DC | PRN

## 2018-04-19 MED ORDER — POTASSIUM CHLORIDE 10 MEQ/100ML IV SOLN
10.0000 meq | INTRAVENOUS | Status: AC
  Administered 2018-04-19 (×3): 10 meq via INTRAVENOUS
  Filled 2018-04-19 (×2): qty 100

## 2018-04-19 MED ORDER — POTASSIUM CHLORIDE 20 MEQ/15ML (10%) PO SOLN: 40.0000 meq | Freq: Two times a day (BID) | ORAL | Status: DC

## 2018-04-19 MED ORDER — MAGNESIUM OXIDE 400 (241.3 MG) MG PO TABS: 400.0000 mg | ORAL_TABLET | Freq: Two times a day (BID) | ORAL | Status: DC

## 2018-04-19 NOTE — Progress Notes (Addendum)
Patient's colostomy bag busted on first shift - I was told it was reinforced with abd pad / tape - at shift change, patient called out and stool was everywhere - it was in his dressing for surgery, whole colostomy bag was peeled off and hanging - I had no choice but to change the dressing - being a WTA I did change the ostomy pouch - stoma site is unremarkable, looks GREAT - still swollen as to be expected - ostomy pouch replaced as well as the wet to dry dressing and abd pad on the midline incision. The honeycomb dressing was saturated with stool - I have requested a new honeycomb dressing from materials to place over site.    New Honeycomb dressing applied 04/19/2018 @ 2200

## 2018-04-19 NOTE — Progress Notes (Signed)
2 Days Post-Op   Subjective/Chief Complaint: Tolerated clears and NG out Is passing flatus as well   Objective: Vital signs in last 24 hours: Temp:  [98.5 F (36.9 C)-98.6 F (37 C)] 98.6 F (37 C) (01/04 0606) Pulse Rate:  [82-94] 94 (01/04 0606) Resp:  [16-24] 20 (01/04 0606) BP: (108-129)/(73-75) 108/75 (01/04 0606) SpO2:  [97 %-100 %] 100 % (01/04 0606) Weight:  [62.6 kg] 62.6 kg (01/04 0500) Last BM Date: 04/18/18  Intake/Output from previous day: 01/03 0701 - 01/04 0700 In: 3628.7 [P.O.:200; I.V.:2508.1; IV Piggyback:920.7] Out: 975 [Urine:925; Stool:50] Intake/Output this shift: No intake/output data recorded.  Exam: Abdomen soft, ostomy working well Incision stable  Lab Results:  No results for input(s): WBC, HGB, HCT, PLT in the last 72 hours. BMET Recent Labs    04/18/18 0412 04/19/18 0350  NA 147* 148*  K 3.2* 2.7*  CL 119* 120*  CO2 18* 18*  GLUCOSE 119* 108*  BUN 53* 42*  CREATININE 2.03* 1.72*  CALCIUM 8.0* 7.9*   PT/INR No results for input(s): LABPROT, INR in the last 72 hours. ABG No results for input(s): PHART, HCO3 in the last 72 hours.  Invalid input(s): PCO2, PO2  Studies/Results: Dg Abd Portable 1v  Result Date: 04/18/2018 CLINICAL DATA:  Nasogastric tube placement. EXAM: PORTABLE ABDOMEN - 1 VIEW COMPARISON:  Radiograph of April 15, 2018. FINDINGS: Distal tip of nasogastric tube is seen in expected position of proximal stomach. Interval placement of bilateral ureteral stents in grossly good position. Large amount of stool seen throughout the colon. No abnormal bowel dilatation is noted. Plastic tubing is seen over left side of abdomen. This most likely represents overlying external object. IMPRESSION: Nasogastric tube tip seen in proximal stomach. Large stool burden is noted consistent with constipation. Bilateral ureteral stents are noted. Electronically Signed   By: Marijo Conception, M.D.   On: 04/18/2018 07:09     Anti-infectives: Anti-infectives (From admission, onward)   Start     Dose/Rate Route Frequency Ordered Stop   04/21/18 1500  ceFAZolin (ANCEF) IVPB 2g/100 mL premix     2 g 200 mL/hr over 30 Minutes Intravenous To Radiology 04/18/18 1608 04/22/18 1500   04/17/18 1046  metroNIDAZOLE (FLAGYL) 5-0.79 MG/ML-% IVPB    Note to Pharmacy:  [name]  : cabinet override      04/17/18 1046 04/17/18 2259   04/17/18 1000  metroNIDAZOLE (FLAGYL) IVPB 500 mg     500 mg 100 mL/hr over 60 Minutes Intravenous  Once 04/16/18 1005 04/17/18 1119   04/15/18 1030  ceFAZolin (ANCEF) IVPB 2g/100 mL premix     2 g 200 mL/hr over 30 Minutes Intravenous On call to O.R. 04/15/18 1018 04/15/18 1647   04/14/18 2230  cefTRIAXone (ROCEPHIN) 1 g in sodium chloride 0.9 % 100 mL IVPB     1 g 200 mL/hr over 30 Minutes Intravenous Daily at bedtime 04/14/18 2218 04/21/18 2159   04/14/18 2230  azithromycin (ZITHROMAX) 500 mg in sodium chloride 0.9 % 250 mL IVPB     500 mg 250 mL/hr over 60 Minutes Intravenous Daily at bedtime 04/14/18 2218 04/21/18 2159      Assessment/Plan: s/p Procedure(s): COLOSTOMY (N/A) EXPLORATORY LAPAROTOMY (N/A)  Will try full liquid diet Oral pain meds Keep PCA until tomorrow   LOS: 4 days    Miguel Gamble 04/19/2018

## 2018-04-19 NOTE — Progress Notes (Signed)
[facility] PROGRESS NOTE    Progress Note  Miguel Gamble  TMH:[id number] DOB: 1950-09-12 DOA: 04/14/2018 PCP: Wenda Low, MD     Brief Narrative:   Miguel Gamble is an 68 y.o. 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 - Patient is status post cystoscopy with bilateral retrograde pyelogram with bilateral stent placement and transurethral resection of bladder tumor on 04/16/2018,  pathology report of biopsy showed colonic adenocarcinoma poorly differentiated. - Status post diverting colostomy on 04/17/2018. - CEA was 208. - NG tube is out currently on a full liquid diet, will transition to oral narcotics. - We will have to place a Port-A-Cath by IR to be done on 1 09/16/2018  Pelvic mass with metastases and hydronephrosis leading to Acute renal failure due to postobstructive nephropathy: - Creatinine on admission 2.4. - Most likely postobstructive. - Urology was consulted who performed cystoscopy with bilateral stent placement   creatinine is slowly improving KVO IV fluids.  Hypernatremia: - With half-normal saline, we will KVO IV fluids as he is taking orals.  Leukocytosis: - Likely due to above has remained afebrile.  Probable community-acquired pneumonia: - Lung nodules on CT versus lymphangitic spread - Culture data negative will discontinue IV antibiotics.  Normocytic anemia CBC now to auto up on hemoglobin.  Hypokalemia: NG tube is out, will replete potassium orally recheck in the morning.  DVT prophylaxis: lovenxo Family  Communication:wife Disposition Plan/Barrier to D/C: Once able to tolerate orals. Code Status:     Code Status Orders  (From admission, onward)         Start     Ordered   04/14/18 2216  Full code  Continuous     04/14/18 2218        Code Status History    This patient has a current code status but no historical code status.        IV Access:    Peripheral IV   Procedures and diagnostic studies:   Dg Abd Portable 1v  Result Date: 04/18/2018 CLINICAL DATA:  Nasogastric tube placement. EXAM: PORTABLE ABDOMEN - 1 VIEW COMPARISON:  Radiograph of April 15, 2018. FINDINGS: Distal tip of nasogastric tube is seen in expected position of proximal stomach. Interval placement of bilateral ureteral stents in grossly good position. Large amount of stool seen throughout the colon. No abnormal bowel dilatation is noted. Plastic tubing is seen over left side of abdomen. This most likely represents overlying external object. IMPRESSION: Nasogastric tube tip seen in proximal stomach. Large stool burden is noted consistent with constipation. Bilateral ureteral stents are noted. Electronically Signed   By: Marijo Conception, M.D.   On: 04/18/2018 07:09     Medical Consultants:    None.  Anti-Infectives:   Rocephin  Subjective:    Miguel Gamble bowel movements, tolerating full liquid diet.  Objective:    Vitals:   04/19/18 0408 04/19/18 0500 04/19/18 0606 04/19/18 0752  BP:   108/75   Pulse:   94   Resp: 20  20 (!) 21  Temp:   98.6 F (37 C)   TempSrc:   Oral   SpO2:  97%  100% 99%  Weight:  62.6 kg    Height:        Intake/Output Summary (Last 24 hours) at 04/19/2018 0910 Last data filed at 04/19/2018 0837 Gross per 24 hour  Intake 4013.85 ml  Output 975 ml  Net 3038.85 ml   Filed Weights   04/15/18 1532 04/17/18 0539 04/19/18 0500  Weight: 59.7 kg 60.1 kg 62.6 kg    Exam: General exam: In no acute distress. Respiratory system: Air movement and clear to  auscultation. Cardiovascular system: Rate and rhythm positive S1-S2 Gastrointestinal system: Bowel sounds soft nontender nondistended Central nervous system: Alert and oriented. No focal neurological deficits. Extremities: No pedal edema. Skin: No rashes, lesions or ulcers Psychiatry: Judgement and insight appear normal. Mood & affect appropriate.    Data Reviewed:    Labs: Basic Metabolic Panel: Recent Labs  Lab 04/15/18 0603 04/16/18 0418 04/17/18 0348 04/18/18 0412 04/19/18 0350  NA 145 150* 151* 147* 148*  K 3.2* 3.9 3.6 3.2* 2.7*  CL 115* 120* 120* 119* 120*  CO2 17* 16* 17* 18* 18*  GLUCOSE 99 87 122* 119* 108*  BUN 76* 61* 68* 53* 42*  CREATININE 2.24* 1.98* 2.14* 2.03* 1.72*  CALCIUM 8.3* 8.7* 8.8* 8.0* 7.9*  MG  --  2.7*  --  2.7*  --    GFR Estimated Creatinine Clearance: 36.4 mL/min (A) (by C-G formula based on SCr of 1.72 mg/dL (H)). Liver Function Tests: Recent Labs  Lab 04/14/18 1801 04/16/18 0418  AST 47* 30  ALT 26 24  ALKPHOS 83 72  BILITOT 1.0 0.8  PROT 7.3 6.4*  ALBUMIN 3.4* 2.7*   Recent Labs  Lab 04/14/18 1801  LIPASE 23   No results for input(s): AMMONIA in the last 168 hours. Coagulation profile No results for input(s): INR, PROTIME in the last 168 hours.  CBC: Recent Labs  Lab 04/14/18 1801 04/15/18 0603 04/16/18 0418  WBC 20.3* 16.1* 15.7*  NEUTROABS  --  14.6* 14.4*  HGB 8.9* 7.5* 8.6*  HCT 29.3* 23.7* 27.8*  MCV 96.1 90.5 94.9  PLT 315 311 308   Cardiac Enzymes: No results for input(s): CKTOTAL, CKMB, CKMBINDEX, TROPONINI in the last 168 hours. BNP (last 3 results) No results for input(s): PROBNP in the last 8760 hours. CBG: Recent Labs  Lab 04/15/18 0952 04/16/18 0759 04/17/18 0741 04/18/18 0810 04/19/18 0803  GLUCAP 85 76 135* 105* 101*   D-Dimer: No results for input(s): DDIMER in the last 72 hours. Hgb A1c: No results for input(s): HGBA1C in the last 72 hours. Lipid Profile: No results for input(s):  CHOL, HDL, LDLCALC, TRIG, CHOLHDL, LDLDIRECT in the last 72 hours. Thyroid function studies: No results for input(s): TSH, T4TOTAL, T3FREE, THYROIDAB in the last 72 hours.  Invalid input(s): FREET3 Anemia work up: No results for input(s): VITAMINB12, FOLATE, FERRITIN, TIBC, IRON, RETICCTPCT in the last 72 hours. Sepsis Labs: Recent Labs  Lab 04/14/18 1801 04/14/18 1808 04/14/18 2009 04/15/18 0603 04/16/18 0418  WBC 20.3*  --   --  16.1* 15.7*  LATICACIDVEN  --  3.65* 1.86  --   --    Microbiology Recent Results (from the past 240 hour(s))  Culture, Urine     Status: None   Collection Time: 04/15/18  6:15 PM  Result Value Ref Range Status   Specimen Description   Final    URINE, CLEAN CATCH Performed at [facility], [place] [address] [place], [place] [address]    Special Requests  Final    NONE Performed at [facility], [place] [address] [place], [place] [address]    Culture   Final    NO GROWTH Performed at [facility], [place] [address], [place], [place] [address]    Report Status 04/17/2018 FINAL  Final  Surgical pcr screen     Status: Abnormal   Collection Time: 04/17/18  2:18 AM  Result Value Ref Range Status   MRSA, PCR NEGATIVE NEGATIVE Final   Staphylococcus aureus POSITIVE (A) NEGATIVE Final    Comment: (NOTE) The Xpert SA Assay (FDA approved for NASAL specimens in patients 85 years of age and older), is one component of a comprehensive surveillance program. It is not intended to diagnose infection nor to guide or monitor treatment. Performed at [facility], [place] [address], [place], [place] [address]      Medications:   . Chlorhexidine Gluconate Cloth  6 each Topical Q0600  . morphine   Intravenous Q4H  . mupirocin ointment   Nasal BID   Continuous Infusions: . sodium chloride Stopped (04/18/18 1553)  . azithromycin Stopped (04/19/18 0030)  . [START ON 04/21/2018]  ceFAZolin (ANCEF) IV     . cefTRIAXone (ROCEPHIN)  IV Stopped (04/18/18 2305)  . dextrose 5 % and 0.45% NaCl 20 mL/hr at 04/19/18 0837  . potassium chloride 10 mEq (04/19/18 0757)       LOS: 4 days   Charlynne Cousins  [facility]   *Please refer to [email or web address], password [email or web address] to get updated schedule on who will round on this patient, as hospitalists switch teams weekly. If 7PM-7AM, please contact night-coverage at [email or web address], password [email or web address] for any overnight needs.  04/19/2018, 9:10 AM

## 2018-04-20 DIAGNOSIS — R531 Weakness: Secondary | ICD-10-CM

## 2018-04-20 LAB — GLUCOSE, CAPILLARY: Glucose-Capillary: 105 mg/dL — ABNORMAL HIGH (ref 70–99)

## 2018-04-20 MED ORDER — MORPHINE SULFATE (PF) 2 MG/ML IV SOLN: 1.0000 mg | INTRAVENOUS | Status: DC | PRN

## 2018-04-20 NOTE — Progress Notes (Signed)
Patient ID: Miguel Gamble, male   DOB: 1950/12/29, 68 y.o.   MRN: [id number]  This NP visited patient at the bedside as a follow up to  yesterday's Sac.  Patient tells me "things are progressing better than anticipated".  He is open to all offered and available medical interventions to prolong life.  He hopes to discharge home in the next day or two and continue with OP oncology treatments.  Hope is for continued quality of life and he is open to all offered and available medical interrventions to achieve that.  Discussed with patient the importance of continued conversation with his  family and his  medical providers regarding overall plan of care and treatment options,  ensuring decisions are within the context of the patients values and GOCs.  Encouraged Miguel Gamble to contact me with continued OP palliative needs.  Questions and concerns addressed     Total time spent on the unit was 15 minutes  Greater than 50% of the time was spent in counseling and coordination of care  Wadie Lessen NP  Palliative Medicine Team Team Phone # [phone] Pager [phone]

## 2018-04-20 NOTE — Progress Notes (Signed)
[facility] PROGRESS NOTE    Progress Note  Miguel Gamble  ALP:[id number] DOB: 04/29/1950 DOA: 04/14/2018 PCP: Wenda Low, MD     Brief Narrative:   Miguel Gamble is an 68 y.o. 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 - Patient is status post cystoscopy with bilateral retrograde pyelogram with bilateral stent placement and transurethral resection of bladder tumor on 04/16/2018,  pathology report of biopsy showed colonic adenocarcinoma poorly differentiated. - Status post diverting colostomy on 04/17/2018. - CEA was 208. - On a regular diet, will transition to oral narcotics. - We will have to place a Port-A-Cath by IR to be done on 1 09/16/2018  Pelvic mass with metastases and hydronephrosis leading to Acute renal failure due to postobstructive nephropathy: - Creatinine on admission 2.4. - Most likely postobstructive. - Urology was consulted who performed cystoscopy with bilateral stent placement   creatinine is slowly improving KVO IV fluids. -Basic metabolic panel in the morning.  Hypernatremia: - With half-normal saline, we will KVO IV fluids as he is taking orals. -Check a basic metabolic panel tomorrow morning.  Leukocytosis: - Likely due to above has remained afebrile. - antiBiotics have been discontinued.  Probable community-acquired pneumonia: - Lung nodules on CT versus lymphangitic spread - Culture data negative will discontinue IV antibiotics.  Normocytic anemia CBC now to auto up on hemoglobin.  Hypokalemia: NG tube is out, check a  basic metabolic panel in the morning.  DVT prophylaxis: lovenxo Family Communication:wife Disposition Plan/Barrier to D/C: Once able to tolerate orals. Code Status:     Code Status Orders  (From admission, onward)         Start     Ordered   04/14/18 2216  Full code  Continuous     04/14/18 2218        Code Status History    This patient has a current code status but no historical code status.        IV Access:    Peripheral IV   Procedures and diagnostic studies:   No results found.   Medical Consultants:    None.  Anti-Infectives:   Rocephin  Subjective:    Miguel Gamble tolerating regular diet having regular bowel movements.  Objective:    Vitals:   04/19/18 2137 04/20/18 0007 04/20/18 0500 04/20/18 0631  BP: 134/77   124/74  Pulse: 85   86  Resp: 19 18  20   Temp: 98.4 F (36.9 C)   97.6 F (36.4 C)  TempSrc: Oral   Oral  SpO2: 100% 100%  100%  Weight:   62.1 kg 60.1 kg  Height:        Intake/Output Summary (Last 24 hours) at 04/20/2018 0947 Last data filed at 04/20/2018 0700 Gross per 24 hour  Intake 1043.86 ml  Output 2000 ml  Net -956.14 ml   Filed Weights   04/19/18 0500 04/20/18 0500 04/20/18 0631  Weight: 62.6 kg 62.1 kg 60.1 kg    Exam: General exam: In no acute distress cachectic Respiratory system: Air movement and clear to auscultation. Cardiovascular system: Rate and rhythm no JVD. Gastrointestinal system: Bowel sounds  soft nontender nondistended, with ostomy in place. Central nervous system: Alert and oriented. No focal neurological deficits. Extremities: No pedal edema. Skin: No rashes, lesions or ulcers Psychiatry: Judgement and insight appear normal. Mood & affect appropriate.    Data Reviewed:    Labs: Basic Metabolic Panel: Recent Labs  Lab 04/15/18 0603 04/16/18 0418 04/17/18 0348 04/18/18 0412 04/19/18 0350  NA 145 150* 151* 147* 148*  K 3.2* 3.9 3.6 3.2* 2.7*  CL 115* 120* 120* 119* 120*    CO2 17* 16* 17* 18* 18*  GLUCOSE 99 87 122* 119* 108*  BUN 76* 61* 68* 53* 42*  CREATININE 2.24* 1.98* 2.14* 2.03* 1.72*  CALCIUM 8.3* 8.7* 8.8* 8.0* 7.9*  MG  --  2.7*  --  2.7*  --    GFR Estimated Creatinine Clearance: 34.9 mL/min (A) (by C-G formula based on SCr of 1.72 mg/dL (H)). Liver Function Tests: Recent Labs  Lab 04/14/18 1801 04/16/18 0418  AST 47* 30  ALT 26 24  ALKPHOS 83 72  BILITOT 1.0 0.8  PROT 7.3 6.4*  ALBUMIN 3.4* 2.7*   Recent Labs  Lab 04/14/18 1801  LIPASE 23   No results for input(s): AMMONIA in the last 168 hours. Coagulation profile No results for input(s): INR, PROTIME in the last 168 hours.  CBC: Recent Labs  Lab 04/14/18 1801 04/15/18 0603 04/16/18 0418 04/19/18 0350  WBC 20.3* 16.1* 15.7* 10.1  NEUTROABS  --  14.6* 14.4* 8.5*  HGB 8.9* 7.5* 8.6* 7.4*  HCT 29.3* 23.7* 27.8* 25.4*  MCV 96.1 90.5 94.9 98.8  PLT 315 311 308 263   Cardiac Enzymes: No results for input(s): CKTOTAL, CKMB, CKMBINDEX, TROPONINI in the last 168 hours. BNP (last 3 results) No results for input(s): PROBNP in the last 8760 hours. CBG: Recent Labs  Lab 04/16/18 0759 04/17/18 0741 04/18/18 0810 04/19/18 0803 04/20/18 0729  GLUCAP 76 135* 105* 101* 105*   D-Dimer: No results for input(s): DDIMER in the last 72 hours. Hgb A1c: No results for input(s): HGBA1C in the last 72 hours. Lipid Profile: No results for input(s): CHOL, HDL, LDLCALC, TRIG, CHOLHDL, LDLDIRECT in the last 72 hours. Thyroid function studies: No results for input(s): TSH, T4TOTAL, T3FREE, THYROIDAB in the last 72 hours.  Invalid input(s): FREET3 Anemia work up: No results for input(s): VITAMINB12, FOLATE, FERRITIN, TIBC, IRON, RETICCTPCT in the last 72 hours. Sepsis Labs: Recent Labs  Lab 04/14/18 1801 04/14/18 1808 04/14/18 2009 04/15/18 0603 04/16/18 0418 04/19/18 0350  WBC 20.3*  --   --  16.1* 15.7* 10.1  LATICACIDVEN  --  3.65* 1.86  --   --   --     Microbiology Recent Results (from the past 240 hour(s))  Culture, Urine     Status: None   Collection Time: 04/15/18  6:15 PM  Result Value Ref Range Status   Specimen Description   Final    URINE, CLEAN CATCH Performed at [facility], [place] [address], [place], [place] [address]    Special Requests   Final    NONE Performed at [facility], [place] [address], [place], [place] [address]    Culture   Final    NO GROWTH Performed at [facility], [place] [address], [place],  [address]    Report Status 04/17/2018 FINAL  Final  Surgical pcr screen     Status: Abnormal   Collection Time: 04/17/18  2:18 AM  Result Value Ref Range Status  MRSA, PCR NEGATIVE NEGATIVE Final   Staphylococcus aureus POSITIVE (A) NEGATIVE Final    Comment: (NOTE) The Xpert SA Assay (FDA approved for NASAL specimens in patients 31 years of age and older), is one component of a comprehensive surveillance program. It is not intended to diagnose infection nor to guide or monitor treatment. Performed at [facility], [place] [address], [place], [place] [address]      Medications:   . Chlorhexidine Gluconate Cloth  6 each Topical Q0600  . mupirocin ointment   Nasal BID   Continuous Infusions: . sodium chloride Stopped (04/18/18 1553)  . [START ON 04/21/2018]  ceFAZolin (ANCEF) IV    . dextrose 5 % and 0.45% NaCl 10 mL/hr at 04/20/18 0513       LOS: 5 days   Charlynne Cousins  [facility]   *Please refer to [email or web address], password [email or web address] to get updated schedule on who will round on this patient, as hospitalists switch teams weekly. If 7PM-7AM, please contact night-coverage at [email or web address], password [email or web address] for any overnight needs.  04/20/2018, 9:47 AM

## 2018-04-20 NOTE — Progress Notes (Signed)
Patient called stating he does not need the Pain pump anymore - he has not used any pushes on my shift - he would like to remove the CO2 monitor and stuff so he can rest - I have turned off the PCA and removed the CO2 monitor per patient request.

## 2018-04-20 NOTE — Progress Notes (Signed)
3 Days Post-Op   Subjective/Chief Complaint: Tolerated full liquids Ostomy with good output   Objective: Vital signs in last 24 hours: Temp:  [97.6 F (36.4 C)-98.5 F (36.9 C)] 97.6 F (36.4 C) (01/05 0631) Pulse Rate:  [85-87] 86 (01/05 0631) Resp:  [12-27] 20 (01/05 0631) BP: (115-134)/(74-77) 124/74 (01/05 0631) SpO2:  [99 %-100 %] 100 % (01/05 0631) Weight:  [62.1 kg] 62.1 kg (01/05 0500) Last BM Date: 04/19/18  Intake/Output from previous day: 01/04 0701 - 01/05 0700 In: 1789 [P.O.:1200; I.V.:324.6; IV Piggyback:264.3] Out: 1800 [Urine:200; SNKNL:9767] Intake/Output this shift: No intake/output data recorded.  Exam: Abdomen soft, minimally tender, ostomy with good output, viable  Lab Results:  Recent Labs    04/19/18 0350  WBC 10.1  HGB 7.4*  HCT 25.4*  PLT 263   BMET Recent Labs    04/18/18 0412 04/19/18 0350  NA 147* 148*  K 3.2* 2.7*  CL 119* 120*  CO2 18* 18*  GLUCOSE 119* 108*  BUN 53* 42*  CREATININE 2.03* 1.72*  CALCIUM 8.0* 7.9*   PT/INR No results for input(s): LABPROT, INR in the last 72 hours. ABG No results for input(s): PHART, HCO3 in the last 72 hours.  Invalid input(s): PCO2, PO2  Studies/Results: No results found.  Anti-infectives: Anti-infectives (From admission, onward)   Start     Dose/Rate Route Frequency Ordered Stop   04/21/18 1500  ceFAZolin (ANCEF) IVPB 2g/100 mL premix     2 g 200 mL/hr over 30 Minutes Intravenous To Radiology 04/18/18 1608 04/22/18 1500   04/17/18 1046  metroNIDAZOLE (FLAGYL) 5-0.79 MG/ML-% IVPB    Note to Pharmacy:  [name]  : cabinet override      04/17/18 1046 04/17/18 2259   04/17/18 1000  metroNIDAZOLE (FLAGYL) IVPB 500 mg     500 mg 100 mL/hr over 60 Minutes Intravenous  Once 04/16/18 1005 04/17/18 1119   04/15/18 1030  ceFAZolin (ANCEF) IVPB 2g/100 mL premix     2 g 200 mL/hr over 30 Minutes Intravenous On call to O.R. 04/15/18 1018 04/15/18 1647   04/14/18 2230   cefTRIAXone (ROCEPHIN) 1 g in sodium chloride 0.9 % 100 mL IVPB  Status:  Discontinued     1 g 200 mL/hr over 30 Minutes Intravenous Daily at bedtime 04/14/18 2218 04/19/18 0916   04/14/18 2230  azithromycin (ZITHROMAX) 500 mg in sodium chloride 0.9 % 250 mL IVPB  Status:  Discontinued     500 mg 250 mL/hr over 60 Minutes Intravenous Daily at bedtime 04/14/18 2218 04/19/18 0916      Assessment/Plan: s/p Procedure(s): COLOSTOMY (N/A) EXPLORATORY LAPAROTOMY (N/A)  Metastatic colon CA  Ostomy working well. Small bowel decompressed.  I doubt there is a small bowel fistula to the colon given his improvement.  Will try regular diet.  Hopefully, ostomy bridge can be removed Tuesday.   LOS: 5 days    [name] 04/20/2018

## 2018-04-21 ENCOUNTER — Encounter (HOSPITAL_COMMUNITY): Payer: Self-pay | Admitting: Interventional Radiology

## 2018-04-21 ENCOUNTER — Inpatient Hospital Stay (HOSPITAL_COMMUNITY): Payer: PRIVATE HEALTH INSURANCE

## 2018-04-21 HISTORY — PX: IR IMAGING GUIDED PORT INSERTION: IMG5740

## 2018-04-21 LAB — COMPREHENSIVE METABOLIC PANEL
ALT: 15 U/L (ref 0–44)
AST: 24 U/L (ref 15–41)
Albumin: 2.2 g/dL — ABNORMAL LOW (ref 3.5–5.0)
Alkaline Phosphatase: 43 U/L (ref 38–126)
Anion gap: 8 (ref 5–15)
BUN: 24 mg/dL — ABNORMAL HIGH (ref 8–23)
CO2: 17 mmol/L — ABNORMAL LOW (ref 22–32)
Calcium: 7.9 mg/dL — ABNORMAL LOW (ref 8.9–10.3)
Chloride: 123 mmol/L — ABNORMAL HIGH (ref 98–111)
Creatinine, Ser: 1.54 mg/dL — ABNORMAL HIGH (ref 0.61–1.24)
GFR calc Af Amer: 53 mL/min — ABNORMAL LOW (ref >60)
GFR, EST NON AFRICAN AMERICAN: 46 mL/min — AB (ref >60)
Glucose, Bld: 95 mg/dL (ref 70–99)
Potassium: 3.2 mmol/L — ABNORMAL LOW (ref 3.5–5.1)
Sodium: 148 mmol/L — ABNORMAL HIGH (ref 135–145)
Total Bilirubin: 0.4 mg/dL (ref 0.3–1.2)
Total Protein: 4.9 g/dL — ABNORMAL LOW (ref 6.5–8.1)

## 2018-04-21 LAB — CBC WITH DIFFERENTIAL/PLATELET
Abs Immature Granulocytes: 0.12 10*3/uL — ABNORMAL HIGH (ref 0.00–0.07)
BASOS ABS: 0 10*3/uL (ref 0.0–0.1)
Basophils Relative: 0 %
Eosinophils Absolute: 0.3 10*3/uL (ref 0.0–0.5)
Eosinophils Relative: 3 %
HCT: 24.7 % — ABNORMAL LOW (ref 39.0–52.0)
Hemoglobin: 7.2 g/dL — ABNORMAL LOW (ref 13.0–17.0)
Immature Granulocytes: 1 %
Lymphocytes Relative: 8 %
Lymphs Abs: 0.8 10*3/uL (ref 0.7–4.0)
MCH: 29.4 pg (ref 26.0–34.0)
MCHC: 29.1 g/dL — AB (ref 30.0–36.0)
MCV: 100.8 fL — ABNORMAL HIGH (ref 80.0–100.0)
Monocytes Absolute: 0.7 10*3/uL (ref 0.1–1.0)
Monocytes Relative: 8 %
NRBC: 0.2 % (ref 0.0–0.2)
Neutro Abs: 7.4 10*3/uL (ref 1.7–7.7)
Neutrophils Relative %: 80 %
Platelets: 238 10*3/uL (ref 150–400)
RBC: 2.45 MIL/uL — ABNORMAL LOW (ref 4.22–5.81)
RDW: 15.3 % (ref 11.5–15.5)
WBC: 9.3 10*3/uL (ref 4.0–10.5)

## 2018-04-21 LAB — GLUCOSE, CAPILLARY: Glucose-Capillary: 77 mg/dL (ref 70–99)

## 2018-04-21 LAB — PROTIME-INR
INR: 1.24
Prothrombin Time: 15.5 seconds — ABNORMAL HIGH (ref 11.4–15.2)

## 2018-04-21 MED ORDER — CEFAZOLIN SODIUM-DEXTROSE 2-4 GM/100ML-% IV SOLN
INTRAVENOUS | Status: AC
  Filled 2018-04-21: qty 100

## 2018-04-21 MED ORDER — LIDOCAINE-EPINEPHRINE (PF) 2 %-1:200000 IJ SOLN
INTRAMUSCULAR | Status: AC
  Filled 2018-04-21: qty 40

## 2018-04-21 MED ORDER — MIDAZOLAM HCL 2 MG/2ML IJ SOLN
INTRAMUSCULAR | Status: AC
  Filled 2018-04-21: qty 2

## 2018-04-21 MED ORDER — FENTANYL CITRATE (PF) 100 MCG/2ML IJ SOLN
INTRAMUSCULAR | Status: AC
  Filled 2018-04-21: qty 2

## 2018-04-21 MED ORDER — HEPARIN SOD (PORK) LOCK FLUSH 100 UNIT/ML IV SOLN
INTRAVENOUS | Status: AC
  Filled 2018-04-21: qty 5

## 2018-04-21 MED ORDER — MIDAZOLAM HCL 2 MG/2ML IJ SOLN
INTRAMUSCULAR | Status: AC | PRN
  Administered 2018-04-21 (×3): 1 mg via INTRAVENOUS

## 2018-04-21 MED ORDER — HEPARIN SOD (PORK) LOCK FLUSH 100 UNIT/ML IV SOLN
INTRAVENOUS | Status: AC | PRN
  Administered 2018-04-21: 500 [IU] via INTRAVENOUS

## 2018-04-21 MED ORDER — POTASSIUM CHLORIDE CRYS ER 20 MEQ PO TBCR
40.0000 meq | EXTENDED_RELEASE_TABLET | Freq: Two times a day (BID) | ORAL | Status: AC
  Administered 2018-04-21 (×2): 40 meq via ORAL
  Filled 2018-04-21 (×2): qty 2

## 2018-04-21 MED ORDER — FENTANYL CITRATE (PF) 100 MCG/2ML IJ SOLN
INTRAMUSCULAR | Status: AC | PRN
  Administered 2018-04-21 (×2): 50 ug via INTRAVENOUS

## 2018-04-21 MED ORDER — LIDOCAINE-EPINEPHRINE (PF) 2 %-1:200000 IJ SOLN
INTRAMUSCULAR | Status: AC | PRN
  Administered 2018-04-21 (×2): 10 mL

## 2018-04-21 NOTE — Sedation Documentation (Signed)
Patient is resting comfortably with eyes closed in NAD. 

## 2018-04-21 NOTE — Progress Notes (Signed)
Pt and wife spoke with [facility] about transfer to 5W, pt is being transferred to 5W due to staffing, MD notified.

## 2018-04-21 NOTE — Progress Notes (Signed)
Physical Therapy Treatment Patient Details Name: Miguel Gamble MRN: [id number] DOB: 10/12/1950 Today's Date: 04/21/2018    History of Present Illness 68 y.o. male who denies any significant past medical history admitted through [facility] with several months of progressive abdominal discomfort, weight loss, decr appetit; pt found to have extensive tumor with mets; s/p  Bilateral ureteral stent placement, with TURBT  on 12/31 per Dr. Alinda Money,  s/p Diverting Loop Colostomy per Dr. Ninfa Linden on 04/17/18;      PT Comments    Pt is mobilizing well. No further PT needs-goals met. Will sign off.    Follow Up Recommendations  No PT follow up     Equipment Recommendations  None recommended by PT    Recommendations for Other Services       Precautions / Restrictions Precautions Precautions: Fall Precaution Comments: colostomy Restrictions Weight Bearing Restrictions: No    Mobility  Bed Mobility               General bed mobility comments: NT-pt sitting EOB  Transfers Overall transfer level: Modified independent   Transfers: Sit to/from Stand              Ambulation/Gait Ambulation/Gait assistance: Supervision Gait Distance (Feet): 250 Feet Assistive device: None       General Gait Details: One small instance of tripping (pt wearing Crocs). Good gait speed. No other instances of instability.    Stairs             Wheelchair Mobility    Modified Rankin (Stroke Patients Only)       Balance Overall balance assessment: Mild deficits observed, not formally tested           Standing balance-Leahy Scale: Good                              Cognition Arousal/Alertness: Awake/alert Behavior During Therapy: WFL for tasks assessed/performed Overall Cognitive Status: Within Functional Limits for tasks assessed                                        Exercises      General Comments        Pertinent Vitals/Pain Pain  Assessment: No/denies pain    Home Living                      Prior Function            PT Goals (current goals can now be found in the care plan section) Progress towards PT goals: Goals met/education completed, patient discharged from PT    Frequency    Min 3X/week      PT Plan      Co-evaluation              AM-PAC PT "6 Clicks" Mobility   Outcome Measure  Help needed turning from your back to your side while in a flat bed without using bedrails?: None Help needed moving from lying on your back to sitting on the side of a flat bed without using bedrails?: A Little Help needed moving to and from a bed to a chair (including a wheelchair)?: None Help needed standing up from a chair using your arms (e.g., wheelchair or bedside chair)?: None Help needed to walk in hospital room?: None Help needed climbing 3-5 steps with a railing? :  A Little 6 Click Score: 22    End of Session   Activity Tolerance: Patient tolerated treatment well Patient left: in bed;with call bell/phone within reach;with family/visitor present   PT Visit Diagnosis: Unsteadiness on feet (R26.81)     Time: 7076-1518 PT Time Calculation (min) (ACUTE ONLY): 8 min  Charges:  $Gait Training: 8-22 mins                        Weston Anna, PT Acute Rehabilitation Services Pager: [phone] Office: [phone]

## 2018-04-21 NOTE — Progress Notes (Signed)
[facility] PROGRESS NOTE    Progress Note  Miguel Gamble  NIO:[id number] DOB: [birth year] DOA: 04/14/2018 PCP: Wenda Low, MD     Brief Narrative:   Miguel Gamble is an 68 y.o. 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 - Patient is status post cystoscopy with bilateral retrograde pyelogram with bilateral stent placement and transurethral resection of bladder tumor on 04/16/2018,  pathology report of biopsy showed colonic adenocarcinoma poorly differentiated. - Status post diverting colostomy on 04/17/2018. - CEA was 208. - On a regular diet, will transition to oral narcotics. -  Port-A-Cath by IR to be done on 1 09/16/2018. -To remove the bridge from colostomy on 04/22/2018  Pelvic mass with metastases and hydronephrosis leading to Acute renal failure due to postobstructive nephropathy: - Creatinine on admission 2.4. - Most likely postobstructive. - Urology was consulted who performed cystoscopy with bilateral stent placement   creatinine is slowly improving KVO IV fluids. -Basic metabolic panel showed creatinine returning close baseline.  Hypernatremia: - With half-normal saline, we will KVO IV fluids as he is taking orals. -Check a basic metabolic panel tomorrow morning.  Leukocytosis: - Likely due to above has remained afebrile. -Likely due to stress.  Probable community-acquired pneumonia: - Lung nodules on CT versus lymphangitic spread - Culture data negative will discontinue IV antibiotics.  Normocytic anemia CBC now to auto up on  hemoglobin.  Hypokalemia: NG tube is out, check a basic metabolic panel in the morning.  DVT prophylaxis: lovenxo Family Communication:wife Disposition Plan/Barrier to D/C: Once bridge removed. Code Status:     Code Status Orders  (From admission, onward)         Start     Ordered   04/14/18 2216  Full code  Continuous     04/14/18 2218        Code Status History    This patient has a current code status but no historical code status.        IV Access:    Peripheral IV   Procedures and diagnostic studies:   No results found.   Medical Consultants:    None.  Anti-Infectives:   Rocephin  Subjective:    Miguel Gamble tolerating regular diet having regular bowel movements.  Objective:    Vitals:   04/20/18 1412 04/21/18 0151 04/21/18 0500 04/21/18 0547  BP: 117/72 130/67  127/71  Pulse: 89 85  84  Resp: 15 20  20   Temp: 98.4 F (36.9 C) 98.9 F (37.2 C)  98.4 F (36.9 C)  TempSrc: Oral Oral  Oral  SpO2: 100% 98%  99%  Weight:   60.6 kg   Height:        Intake/Output Summary (Last 24 hours) at 04/21/2018 0927 Last data filed at 04/21/2018 0740 Gross per 24 hour  Intake 50.4 ml  Output 1575 ml  Net -1524.6 ml   Filed Weights   04/20/18 0500 04/20/18 0631 04/21/18 0500  Weight: 62.1 kg 60.1 kg 60.6 kg    Exam: General exam: In no acute distress cachectic Respiratory system: Air movement and clear to auscultation. Cardiovascular system: Rate and rhythm no JVD.  Gastrointestinal system: Bowel sounds soft nontender nondistended, with ostomy in place. Central nervous system: Alert and oriented. No focal neurological deficits. Extremities: No pedal edema. Skin: No rashes, lesions or ulcers Psychiatry: Judgement and insight appear normal. Mood & affect appropriate.    Data Reviewed:    Labs: Basic Metabolic Panel: Recent Labs  Lab 04/16/18 0418 04/17/18 0348 04/18/18 0412 04/19/18 0350 04/21/18 0530  NA 150* 151* 147* 148*  148*  K 3.9 3.6 3.2* 2.7* 3.2*  CL 120* 120* 119* 120* 123*  CO2 16* 17* 18* 18* 17*  GLUCOSE 87 122* 119* 108* 95  BUN 61* 68* 53* 42* 24*  CREATININE 1.98* 2.14* 2.03* 1.72* 1.54*  CALCIUM 8.7* 8.8* 8.0* 7.9* 7.9*  MG 2.7*  --  2.7*  --   --    GFR Estimated Creatinine Clearance: 39.4 mL/min (A) (by C-G formula based on SCr of 1.54 mg/dL (H)). Liver Function Tests: Recent Labs  Lab 04/14/18 1801 04/16/18 0418 04/21/18 0530  AST 47* 30 24  ALT 26 24 15   ALKPHOS 83 72 43  BILITOT 1.0 0.8 0.4  PROT 7.3 6.4* 4.9*  ALBUMIN 3.4* 2.7* 2.2*   Recent Labs  Lab 04/14/18 1801  LIPASE 23   No results for input(s): AMMONIA in the last 168 hours. Coagulation profile Recent Labs  Lab 04/21/18 0530  INR 1.24    CBC: Recent Labs  Lab 04/14/18 1801 04/15/18 0603 04/16/18 0418 04/19/18 0350 04/21/18 0530  WBC 20.3* 16.1* 15.7* 10.1 9.3  NEUTROABS  --  14.6* 14.4* 8.5* 7.4  HGB 8.9* 7.5* 8.6* 7.4* 7.2*  HCT 29.3* 23.7* 27.8* 25.4* 24.7*  MCV 96.1 90.5 94.9 98.8 100.8*  PLT 315 311 308 263 238   Cardiac Enzymes: No results for input(s): CKTOTAL, CKMB, CKMBINDEX, TROPONINI in the last 168 hours. BNP (last 3 results) No results for input(s): PROBNP in the last 8760 hours. CBG: Recent Labs  Lab 04/17/18 0741 04/18/18 0810 04/19/18 0803 04/20/18 0729 04/21/18 0727  GLUCAP 135* 105* 101* 105* 77   D-Dimer: No results for input(s): DDIMER in the last 72 hours. Hgb A1c: No results for input(s): HGBA1C in the last 72 hours. Lipid Profile: No results for input(s): CHOL, HDL, LDLCALC, TRIG, CHOLHDL, LDLDIRECT in the last 72 hours. Thyroid function studies: No results for input(s): TSH, T4TOTAL, T3FREE, THYROIDAB in the last 72 hours.  Invalid input(s): FREET3 Anemia work up: No results for input(s): VITAMINB12, FOLATE, FERRITIN, TIBC, IRON, RETICCTPCT in the last 72 hours. Sepsis Labs: Recent Labs  Lab 04/14/18 1808 04/14/18 2009 04/15/18 0603 04/16/18 0418  04/19/18 0350 04/21/18 0530  WBC  --   --  16.1* 15.7* 10.1 9.3  LATICACIDVEN 3.65* 1.86  --   --   --   --    Microbiology Recent Results (from the past 240 hour(s))  Culture, Urine     Status: None   Collection Time: 04/15/18  6:15 PM  Result Value Ref Range Status   Specimen Description   Final    URINE, CLEAN CATCH Performed at [facility], [place] [address] [place], [place] [address]    Special Requests   Final    NONE Performed at [facility], [place] [address] [place], [place] [address]    Culture   Final    NO GROWTH Performed at [facility], [place] [address], [place], [place] [address]    Report Status 04/17/2018 FINAL  Final  Surgical pcr screen  Status: Abnormal   Collection Time: 04/17/18  2:18 AM  Result Value Ref Range Status   MRSA, PCR NEGATIVE NEGATIVE Final   Staphylococcus aureus POSITIVE (A) NEGATIVE Final    Comment: (NOTE) The Xpert SA Assay (FDA approved for NASAL specimens in patients 42 years of age and older), is one component of a comprehensive surveillance program. It is not intended to diagnose infection nor to guide or monitor treatment. Performed at [facility], [place] [address], [place], [place] [address]      Medications:   . Chlorhexidine Gluconate Cloth  6 each Topical Q0600  . mupirocin ointment   Nasal BID   Continuous Infusions: . sodium chloride Stopped (04/18/18 1553)  .  ceFAZolin (ANCEF) IV    . dextrose 5 % and 0.45% NaCl Stopped (04/20/18 1015)       LOS: 6 days   Charlynne Cousins  [facility]   *Please refer to [email or web address], password [email or web address] to get updated schedule on who will round on this patient, as hospitalists switch teams weekly. If 7PM-7AM, please contact night-coverage at [email or web address], password [email or web address] for any overnight needs.  04/21/2018, 9:27 AM

## 2018-04-21 NOTE — Progress Notes (Signed)
Patient ID: Miguel Gamble, male   DOB: 1950/07/31, 68 y.o.   MRN: [id number]    4 Days Post-Op  Subjective: Pt thirsty bc he is NPO for PAC placement today.  He is otherwise eating well and his colostomy is working great.  Objective: Vital signs in last 24 hours: Temp:  [98.4 F (36.9 C)-98.9 F (37.2 C)] 98.4 F (36.9 C) (01/06 0547) Pulse Rate:  [84-89] 84 (01/06 0547) Resp:  [15-20] 20 (01/06 0547) BP: (117-130)/(67-72) 127/71 (01/06 0547) SpO2:  [98 %-100 %] 99 % (01/06 0547) Weight:  [60.6 kg] 60.6 kg (01/06 0500) Last BM Date: 04/20/18  Intake/Output from previous day: 01/05 0701 - 01/06 0700 In: 50.4 [I.V.:50.4] Out: 1475 [Urine:200; Stool:1275] Intake/Output this shift: Total I/O In: -  Out: 200 [Stool:200]  PE: Abd: soft, appropriately tender, midline incision is c/d/i with staples and honeycomb dressing.  Loop colostomy with liquid brown stool present.  Stoma is viable.  Lab Results:  Recent Labs    04/19/18 0350 04/21/18 0530  WBC 10.1 9.3  HGB 7.4* 7.2*  HCT 25.4* 24.7*  PLT 263 238   BMET Recent Labs    04/19/18 0350 04/21/18 0530  NA 148* 148*  K 2.7* 3.2*  CL 120* 123*  CO2 18* 17*  GLUCOSE 108* 95  BUN 42* 24*  CREATININE 1.72* 1.54*  CALCIUM 7.9* 7.9*   PT/INR Recent Labs    04/21/18 0530  LABPROT 15.5*  INR 1.24   CMP     Component Value Date/Time   NA 148 (H) 04/21/2018 0530   K 3.2 (L) 04/21/2018 0530   CL 123 (H) 04/21/2018 0530   CO2 17 (L) 04/21/2018 0530   GLUCOSE 95 04/21/2018 0530   BUN 24 (H) 04/21/2018 0530   CREATININE 1.54 (H) 04/21/2018 0530   CALCIUM 7.9 (L) 04/21/2018 0530   PROT 4.9 (L) 04/21/2018 0530   ALBUMIN 2.2 (L) 04/21/2018 0530   AST 24 04/21/2018 0530   ALT 15 04/21/2018 0530   ALKPHOS 43 04/21/2018 0530   BILITOT 0.4 04/21/2018 0530   GFRNONAA 46 (L) 04/21/2018 0530   GFRAA 53 (L) 04/21/2018 0530   Lipase     Component Value Date/Time   LIPASE 23 04/14/2018 1801        Studies/Results: No results found.  Anti-infectives: Anti-infectives (From admission, onward)   Start     Dose/Rate Route Frequency Ordered Stop   04/21/18 1500  ceFAZolin (ANCEF) IVPB 2g/100 mL premix     2 g 200 mL/hr over 30 Minutes Intravenous To Radiology 04/18/18 1608 04/22/18 1500   04/17/18 1046  metroNIDAZOLE (FLAGYL) 5-0.79 MG/ML-% IVPB    Note to Pharmacy:  [name]  : cabinet override      04/17/18 1046 04/17/18 2259   04/17/18 1000  metroNIDAZOLE (FLAGYL) IVPB 500 mg     500 mg 100 mL/hr over 60 Minutes Intravenous  Once 04/16/18 1005 04/17/18 1119   04/15/18 1030  ceFAZolin (ANCEF) IVPB 2g/100 mL premix     2 g 200 mL/hr over 30 Minutes Intravenous On call to O.R. 04/15/18 1018 04/15/18 1647   04/14/18 2230  cefTRIAXone (ROCEPHIN) 1 g in sodium chloride 0.9 % 100 mL IVPB  Status:  Discontinued     1 g 200 mL/hr over 30 Minutes Intravenous Daily at bedtime 04/14/18 2218 04/19/18 0916   04/14/18 2230  azithromycin (ZITHROMAX) 500 mg in sodium chloride 0.9 % 250 mL IVPB  Status:  Discontinued     500 mg  250 mL/hr over 60 Minutes Intravenous Daily at bedtime 04/14/18 2218 04/19/18 0916       Assessment/Plan   Partial large bowel obstruction Pelvic mass consistent with bladder cancer or sigmoid colon cancer and possible fistulization to small bowel.  Chronic blood loss anemia Hematuria Dehydration Acute kidney injury vs chronic kidney disease Right hydronephrosis Bladder masses Metastatic cancer likely given adenopathy seen in retroperitoneal, mesenteric, and pelvic location.  Hepatic masses Pulmonary masses/airspace disease. Dilated esophagus  POD 4, s/p ex lap with diverting transverse loop colostomy, Dr. Ninfa Linden 04/18/18 -doing well and tolerating regular diet -will place to DC bridge tomorrow prior to potential discharge. -will plan for staple removal next week in the office and then follow up with Dr. Ninfa Linden after that. -HH will  be arranged to assist with new colostomy.  FEN - NPO right now for PAC, but after regular diet VTE - on hold for procedure ID - ancef for procedure   LOS: 6 days    Henreitta Cea , [facility] Surgery 04/21/2018, 8:39 AM Pager: [phone]

## 2018-04-21 NOTE — Care Management Note (Signed)
Case Management Note  Patient Details  Name: Miguel Gamble MRN: [id number] Date of Birth: 01/03/1951  Subjective/Objective: PSBO. From home w/spouse.PT-recc no f/u;may need rw. Received HHC orders for HHRN-patient pleasantly decline HHC-states he is able to contact his doctor when needed,able to take own meds,understands his medical condition. Also declines home rw-states he has a cane-its sufficient for now-he can contact his doctor if he needs additional equipment. Provided [facility] agency list as a resource. Patient very appreciative. No further CM needs.                  Action/Plan:dc home.   Expected Discharge Date:                  Expected Discharge Plan:  Home/Self Care  In-House Referral:     Discharge planning Services  CM Consult  Post Acute Care Choice:    Choice offered to:  Patient  DME Arranged:    DME Agency:     HH Arranged:  Patient Refused [place] Agency:     Status of Service:  Completed, signed off  If discussed at [employer] of Stay Meetings, dates discussed:    Additional Comments:  Dessa Phi, RN 04/21/2018, 12:47 PM

## 2018-04-21 NOTE — Discharge Instructions (Addendum)
[facility]      [facility] °[phone] ° °OPEN ABDOMINAL SURGERY: POST OP INSTRUCTIONS ° °Always review your discharge instruction sheet given to you by the facility where your surgery was performed. ° °IF YOU HAVE DISABILITY OR FAMILY LEAVE FORMS, YOU MUST BRING THEM TO THE OFFICE FOR PROCESSING.  PLEASE DO NOT GIVE THEM TO YOUR DOCTOR. ° °1. A prescription for pain medication may be given to you upon discharge.  Take your pain medication as prescribed, if needed.  If narcotic pain medicine is not needed, then you may take acetaminophen (Tylenol) or ibuprofen (Advil) as needed. °2. Take your usually prescribed medications unless otherwise directed. °3. If you need a refill on your pain medication, please contact your pharmacy. They will contact our office to request authorization.  Prescriptions will not be filled after 5pm or on week-ends. °4. You should follow a light diet the first few days after arrival home, such as soup and crackers, pudding, etc.unless your doctor has advised otherwise. A high-fiber, low fat diet can be resumed as tolerated.   Be sure to include lots of fluids daily. Most patients will experience some swelling and bruising on the chest and neck area.  Ice packs will help.  Swelling and bruising can take several days to resolve °5. Most patients will experience some swelling and bruising in the area of the incision. Ice pack will help. Swelling and bruising can take several days to resolve..  °6. It is common to experience some constipation if taking pain medication after surgery.  Increasing fluid intake and taking a stool softener will usually help or prevent this problem from occurring.  A mild laxative (Milk of Magnesia or Miralax) should be taken according to package directions if there are no bowel movements after 48 hours. °7.  You may have steri-strips (small skin tapes) in place directly over the incision.  These strips should be left on the skin for 7-10 days.  If your  surgeon used skin glue on the incision, you may shower in 24 hours.  The glue will flake off over the next 2-3 weeks.  Any sutures or staples will be removed at the office during your follow-up visit. You may find that a light gauze bandage over your incision may keep your staples from being rubbed or pulled. You may shower and replace the bandage daily. °8. ACTIVITIES:  You may resume regular (light) daily activities beginning the next day--such as daily self-care, walking, climbing stairs--gradually increasing activities as tolerated.  You may have sexual intercourse when it is comfortable.  Refrain from any heavy lifting or straining until approved by your doctor. °a. You may drive when you no longer are taking prescription pain medication, you can comfortably wear a seatbelt, and you can safely maneuver your car and apply brakes °b. Return to Work: ___________________________________ °9. You should see your doctor in the office for a follow-up appointment approximately two weeks after your surgery.  Make sure that you call for this appointment within a day or two after you arrive home to insure a convenient appointment time. °OTHER INSTRUCTIONS:  °_____________________________________________________________ °_____________________________________________________________ ° °WHEN TO CALL YOUR DOCTOR: °1. Fever over 101.0 °2. Inability to urinate °3. Nausea and/or vomiting °4. Extreme swelling or bruising °5. Continued bleeding from incision. °6. Increased pain, redness, or drainage from the incision. °7. Difficulty swallowing or breathing °8. Muscle cramping or spasms. °9. Numbness or tingling in hands or feet or around lips. ° °The clinic staff is available to   answer your questions during regular business hours.  Please don’t hesitate to call and ask to speak to one of the nurses if you have concerns. ° °For further questions, please visit [email or web address] ° ° ° °Colostomy Home Guide,  Adult ° °Colostomy surgery is done to create an opening in the front of the abdomen for stool (feces) to leave the body through an ostomy (stoma). Part of the large intestine is attached to the stoma. A bag, also called a pouch, is fitted over the stoma. Stool and gas will collect in the bag. °After surgery, you will need to empty and change your colostomy bag as needed. You will also need to care for your stoma. °How to care for the stoma °Your stoma should look pink, red, and moist, like the inside of your cheek. Soon after surgery, the stoma may be swollen, but this swelling will go away within 6 weeks. To care for the stoma: °· Keep the skin around the stoma clean and dry. °· Use a clean, soft washcloth to gently wash the stoma and the skin around it. Clean using a circular motion, and wipe away from the stoma opening, not toward it. °? Use warm water and only use cleansers recommended by your health care provider. °? Rinse the stoma area with plain water. °? Dry the area around the stoma well. °· Use stoma powder or ointment on your skin only as told by your health care provider. Do not use any other powders, gels, wipes, or creams on the skin around the stoma. °· Check the stoma area every day for signs of infection. Check for: °? New or worsening redness, swelling, or pain. °? New or increased fluid or blood. °? Pus or warmth. °· Measure the stoma opening regularly and record the size. Watch for changes. (It is normal for the stoma to get smaller as swelling goes away.) Share this information with your health care provider. °How to empty the colostomy bag ° °Empty your bag at bedtime and whenever it is one-third to one-half full. Do not let the bag get more than half-full with stool or gas. The bag could leak if it gets too full. Some colostomy bags have a built-in gas release valve that releases gas often throughout the day. °Follow these basic steps: °1. Wash your hands with soap and water. °2. Sit far back  on the toilet seat. °3. Put several pieces of toilet paper into the toilet water. This will prevent splashing as you empty stool into the toilet. °4. Remove the clip or the hook-and-loop fastener from the tail end of the bag. °5. Unroll the tail, then empty the stool into the toilet. °6. Clean the tail with toilet paper or a moist towelette. °7. Reroll the tail, and close it with the clip or the hook-and-loop fastener. °8. Wash your hands again. °How to change the colostomy bag °Change your bag every 3-4 days or as often as told by your health care provider. Also change the bag if it is leaking or separating from the skin, or if your skin around the stoma looks or feels irritated. Irritated skin may be a sign that the bag is leaking. °Always have colostomy supplies with you, and follow these basic steps: °1. Wash your hands with soap and water. Have paper towels or tissues nearby to clean any discharge. °2. Remove the old bag and skin barrier. Use your fingers or a warm cloth to gently push the skin away from the barrier. °3. Clean   the stoma area with water or with mild soap and water, as directed. Use water to rinse away any soap. 4. Dry the skin. You may use the cool setting on a hair dryer to do this. 5. Use a tracing pattern (template) to cut the skin barrier to the size needed. 6. If you are using a two-piece bag, attach the bag and the skin barrier to each other. Add the barrier ring, if you use one. 7. If directed, apply stoma powder or skin barrier gel to the skin. 8. Warm the skin barrier with your hands, or blow with a hair dryer for 5-10 seconds. 9. Remove the paper from the adhesive strip of the skin barrier. 10. Press the adhesive strip onto the skin around the stoma. 11. Gently rub the skin barrier onto the skin. This creates heat that helps the barrier to stick. 12. Apply stoma tape to the edges of the skin barrier, if desired. 19. Wash your hands again. General recommendations  Avoid  wearing tight clothes or having anything press directly on your stoma or bag. Change your clothing whenever it is soiled or damp.  You may shower or bathe with the bag on or off. Do not use harsh or oily soaps or lotions. Dry the skin and bag after bathing.  Store all supplies in a cool, dry place. Do not leave supplies in extreme heat because some parts can melt or not stick as well.  Whenever you leave home, take extra clothing and an extra skin barrier and bag with you.  If your bag gets wet, you can dry it with a hair dryer on the cool setting.  To prevent odor, you may put drops of ostomy deodorizer in the bag.  If recommended by your health care provider, put ostomy lubricant inside the bag. This helps stool to slide out of the bag more easily and completely. Contact a health care provider if:  You have new or worsening redness, swelling, or pain around your stoma.  You have new or increased fluid or blood coming from your stoma.  Your stoma feels warm to the touch.  You have pus coming from your stoma.  Your stoma extends in or out farther than normal.  You need to change your bag every day.  You have a fever. Get help right away if:  Your stool is bloody.  You have nausea or you vomit.  You have trouble breathing. Summary  Measure your stoma opening regularly and record the size. Watch for changes.  Empty your bag at bedtime and whenever it is one-third to one-half full. Do not let the bag get more than half-full with stool or gas.  Change your bag every 3-4 days or as often as told by your health care provider.  Whenever you leave home, take extra clothing and an extra skin barrier and bag with you. This information is not intended to replace advice given to you by your health care provider. Make sure you discuss any questions you have with your health care provider. Document Released: 04/05/2003 Document Revised: 12/06/2017 Document Reviewed:  09/26/2016 Elsevier Interactive Patient Education  2019 [employer].   Moderate Conscious Sedation, Adult, Care After These instructions provide you with information about caring for yourself after your procedure. Your health care provider may also give you more specific instructions. Your treatment has been planned according to current medical practices, but problems sometimes occur. Call your health care provider if you have any problems or questions after your  procedure. What can I expect after the procedure? After your procedure, it is common:  To feel sleepy for several hours.  To feel clumsy and have poor balance for several hours.  To have poor judgment for several hours.  To vomit if you eat too soon. Follow these instructions at home: For at least 24 hours after the procedure:   Do not: ? Participate in activities where you could fall or become injured. ? Drive. ? Use heavy machinery. ? Drink alcohol. ? Take sleeping pills or medicines that cause drowsiness. ? Make important decisions or sign legal documents. ? Take care of children on your own.  Rest. Eating and drinking  Follow the diet recommended by your health care provider.  If you vomit: ? Drink water, juice, or soup when you can drink without vomiting. ? Make sure you have little or no nausea before eating solid foods. General instructions  Have a responsible adult stay with you until you are awake and alert.  Take over-the-counter and prescription medicines only as told by your health care provider.  If you smoke, do not smoke without supervision.  Keep all follow-up visits as told by your health care provider. This is important. Contact a health care provider if:  You keep feeling nauseous or you keep vomiting.  You feel light-headed.  You develop a rash.  You have a fever. Get help right away if:  You have trouble breathing. This information is not intended to replace advice given to you  by your health care provider. Make sure you discuss any questions you have with your health care provider. Document Released: 01/21/2013 Document Revised: 09/05/2015 Document Reviewed: 07/23/2015 Elsevier Interactive Patient Education  2019 [place] An implanted port is a device that is placed under the skin. It is usually placed in the chest. The device can be used to give IV medicine, to take blood, or for dialysis. You may have an implanted port if:  You need IV medicine that would be irritating to the small veins in your hands or arms.  You need IV medicines, such as antibiotics, for a long period of time.  You need IV nutrition for a long period of time.  You need dialysis. Having a port means that your health care provider will not need to use the veins in your arms for these procedures. You may have fewer limitations when using a port than you would if you used other types of long-term IVs, and you will likely be able to return to normal activities after your incision heals. An implanted port has two main parts:  Reservoir. The reservoir is the part where a needle is inserted to give medicines or draw blood. The reservoir is round. After it is placed, it appears as a small, raised area under your skin.  Catheter. The catheter is a thin, flexible tube that connects the reservoir to a vein. Medicine that is inserted into the reservoir goes into the catheter and then into the vein. How is my port accessed? To access your port:  A numbing cream may be placed on the skin over the port site.  Your health care provider will put on a mask and sterile gloves.  The skin over your port will be cleaned carefully with a germ-killing soap and allowed to dry.  Your health care provider will gently pinch the port and insert a needle into it.  Your health care provider will check for a blood return to  make sure the port is in the vein and is not clogged.  If your  port needs to remain accessed to get medicine continuously (constant infusion), your health care provider will place a clear bandage (dressing) over the needle site. The dressing and needle will need to be changed every week, or as told by your health care provider. What is flushing? Flushing helps keep the port from getting clogged. Follow instructions from your health care provider about how and when to flush the port. Ports are usually flushed with saline solution or a medicine called heparin. The need for flushing will depend on how the port is used:  If the port is only used from time to time to give medicines or draw blood, the port may need to be flushed: ? Before and after medicines have been given. ? Before and after blood has been drawn. ? As part of routine maintenance. Flushing may be recommended every 4-6 weeks.  If a constant infusion is running, the port may not need to be flushed.  Throw away any syringes in a disposal container that is meant for sharp items (sharps container). You can buy a sharps container from a pharmacy, or you can make one by using an empty hard plastic bottle with a cover. How long will my port stay implanted? The port can stay in for as long as your health care provider thinks it is needed. When it is time for the port to come out, a surgery will be done to remove it. The surgery will be similar to the procedure that was done to put the port in. Follow these instructions at home:   Flush your port as told by your health care provider.  If you need an infusion over several days, follow instructions from your health care provider about how to take care of your port site. Make sure you: ? Wash your hands with soap and water before you change your dressing. If soap and water are not available, use alcohol-based hand sanitizer. ? Change your dressing as told by your health care provider. ? Place any used dressings or infusion bags into a plastic bag. Throw  that bag in the trash. ? Keep the dressing that covers the needle clean and dry. Do not get it wet. ? Do not use scissors or sharp objects near the tube. ? Keep the tube clamped, unless it is being used.  Check your port site every day for signs of infection. Check for: ? Redness, swelling, or pain. ? Fluid or blood. ? Pus or a bad smell.  Protect the skin around the port site. ? Avoid wearing bra straps that rub or irritate the site. ? Protect the skin around your port from seat belts. Place a soft pad over your chest if needed.  Bathe or shower as told by your health care provider. The site may get wet as long as you are not actively receiving an infusion.  Return to your normal activities as told by your health care provider. Ask your health care provider what activities are safe for you.  Carry a medical alert card or wear a medical alert bracelet at all times. This will let health care providers know that you have an implanted port in case of an emergency. Get help right away if:  You have redness, swelling, or pain at the port site.  You have fluid or blood coming from your port site.  You have pus or a bad smell coming from the  port site.  You have a fever. Summary  Implanted ports are usually placed in the chest for long-term IV access.  Follow instructions from your health care provider about flushing the port and changing bandages (dressings).  Take care of the area around your port by avoiding clothing that puts pressure on the area, and by watching for signs of infection.  Protect the skin around your port from seat belts. Place a soft pad over your chest if needed.  Get help right away if you have a fever or you have redness, swelling, pain, drainage, or a bad smell at the port site. This information is not intended to replace advice given to you by your health care provider. Make sure you discuss any questions you have with your health care provider. Document  Released: 04/02/2005 Document Revised: 05/05/2016 Document Reviewed: 05/05/2016 Elsevier Interactive Patient Education  2019 [employer].

## 2018-04-21 NOTE — Procedures (Signed)
Interventional Radiology Procedure Note  Procedure: Placement of a right IJ approach single lumen PowerPort.  Tip is positioned at the superior cavoatrial junction and catheter is ready for immediate use.  Complications: No immediate Recommendations:  - Ok to shower tomorrow - Do not submerge for 7 days - Routine line care   Signed,  Ruben Mahler K. Skylynne Schlechter, MD   

## 2018-04-22 ENCOUNTER — Encounter: Payer: Self-pay | Admitting: *Deleted

## 2018-04-22 DIAGNOSIS — Z0189 Encounter for other specified special examinations: Secondary | ICD-10-CM

## 2018-04-22 LAB — BASIC METABOLIC PANEL
ANION GAP: 8 (ref 5–15)
BUN: 17 mg/dL (ref 8–23)
CO2: 19 mmol/L — ABNORMAL LOW (ref 22–32)
Calcium: 8.2 mg/dL — ABNORMAL LOW (ref 8.9–10.3)
Chloride: 118 mmol/L — ABNORMAL HIGH (ref 98–111)
Creatinine, Ser: 1.61 mg/dL — ABNORMAL HIGH (ref 0.61–1.24)
GFR calc Af Amer: 50 mL/min — ABNORMAL LOW (ref >60)
GFR calc non Af Amer: 43 mL/min — ABNORMAL LOW (ref >60)
Glucose, Bld: 110 mg/dL — ABNORMAL HIGH (ref 70–99)
Potassium: 3.5 mmol/L (ref 3.5–5.1)
SODIUM: 145 mmol/L (ref 135–145)

## 2018-04-22 LAB — GLUCOSE, CAPILLARY: Glucose-Capillary: 82 mg/dL (ref 70–99)

## 2018-04-22 MED ORDER — POTASSIUM CHLORIDE CRYS ER 20 MEQ PO TBCR
40.0000 meq | EXTENDED_RELEASE_TABLET | Freq: Once | ORAL | Status: AC
  Administered 2018-04-22: 40 meq via ORAL
  Filled 2018-04-22: qty 2

## 2018-04-22 MED ORDER — SODIUM CHLORIDE 0.9% FLUSH: 10.0000 mL | INTRAVENOUS | Status: DC | PRN

## 2018-04-22 NOTE — Consult Note (Signed)
[facility] Nurse ostomy follow up Patient receiving care in [place].  Spouse present for teaching and pouch change procedure. Stoma type/location: LUQ  Stomal assessment/size: 2 inches, round, red, budded, all sutures intact.  Support rod removed. Peristomal assessment: Intact, normal color and texture Treatment options for stomal/peristomal skin: Barrier Ring Output: pudding consistency, brown stool  Ostomy pouching: 2pc. Flat, 2 3/4 inches Education provided: Patient removed 50% of the existing skin barrier, cleansed around stoma.  I cut the new barrier and gave them the template to take home.  I assisted the patient in placing the barrier around the stoma. The spouse placed the new pouch on the barrier.  The patient demonstrated how to open and close the pouch.  The Secure Start delivery from [place] was confirmed by the spouse while the session was in process.  Extra pouches, barriers and barrier rings were provided.  All questions were answered to their expressed satisfaction. Enrolled patient in [place] Start Discharge program: Yes Val Riles, RN, MSN, [facility], CNS-BC, pager [phone]

## 2018-04-22 NOTE — Progress Notes (Signed)
Patient ID: Miguel Gamble, male   DOB: 1950-06-25, 68 y.o.   MRN: [id number]    5 Days Post-Op  Subjective: Patient feels well.  Stool output is decreasing and becoming more solid.  Eating well.  Objective: Vital signs in last 24 hours: Temp:  [97.6 F (36.4 C)-99.2 F (37.3 C)] 98.1 F (36.7 C) (01/07 8588) Pulse Rate:  [77-93] 84 (01/07 0633) Resp:  [11-22] 18 (01/07 5027) BP: (115-139)/(62-84) 126/75 (01/07 7412) SpO2:  [96 %-100 %] 99 % (01/07 8786) Last BM Date: 04/21/18  Intake/Output from previous day: 01/06 0701 - 01/07 0700 In: 710 [P.O.:710] Out: 2300 [Urine:250; Stool:2050] Intake/Output this shift: Total I/O In: 120 [P.O.:120] Out: 100 [Urine:100]  PE: Abd: soft, rod removed this morning.  Stoma is pink and viable.  Some stool is dried on the os and makes it appear necrotic, but this can be picked off and pink mucosa is seen underneath this.  Incision is c/d/i with staples  Lab Results:  Recent Labs    04/21/18 0530  WBC 9.3  HGB 7.2*  HCT 24.7*  PLT 238   BMET Recent Labs    04/21/18 0530  NA 148*  K 3.2*  CL 123*  CO2 17*  GLUCOSE 95  BUN 24*  CREATININE 1.54*  CALCIUM 7.9*   PT/INR Recent Labs    04/21/18 0530  LABPROT 15.5*  INR 1.24   CMP     Component Value Date/Time   NA 148 (H) 04/21/2018 0530   K 3.2 (L) 04/21/2018 0530   CL 123 (H) 04/21/2018 0530   CO2 17 (L) 04/21/2018 0530   GLUCOSE 95 04/21/2018 0530   BUN 24 (H) 04/21/2018 0530   CREATININE 1.54 (H) 04/21/2018 0530   CALCIUM 7.9 (L) 04/21/2018 0530   PROT 4.9 (L) 04/21/2018 0530   ALBUMIN 2.2 (L) 04/21/2018 0530   AST 24 04/21/2018 0530   ALT 15 04/21/2018 0530   ALKPHOS 43 04/21/2018 0530   BILITOT 0.4 04/21/2018 0530   GFRNONAA 46 (L) 04/21/2018 0530   GFRAA 53 (L) 04/21/2018 0530   Lipase     Component Value Date/Time   LIPASE 23 04/14/2018 1801       Studies/Results: Ir Imaging Guided Port Insertion  Result Date: 04/21/2018 INDICATION:  68 year old male with 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 He requires durable venous access for anticipated chemotherapy. EXAM: IMPLANTED PORT A CATH PLACEMENT WITH ULTRASOUND AND FLUOROSCOPIC GUIDANCE MEDICATIONS: 2 g Ancef; The antibiotic was administered within an appropriate time interval prior to skin puncture. ANESTHESIA/SEDATION: Versed 3 mg IV; Fentanyl 100 mcg IV; Moderate Sedation Time:  26 minutes The patient was continuously monitored during the procedure by the interventional radiology nurse under my direct supervision. FLUOROSCOPY TIME:  0 minutes, 18 seconds (1 mGy) COMPLICATIONS: None immediate. PROCEDURE: The right neck and chest was prepped with chlorhexidine, and draped in the usual sterile fashion using maximum barrier technique (cap and mask, sterile gown, sterile gloves, large sterile sheet, hand hygiene and cutaneous antiseptic). Antibiotic prophylaxis was provided with g Ancef administered IV one hour prior to skin incision. Local anesthesia was attained by infiltration with 1% lidocaine with epinephrine. Ultrasound demonstrated patency of the right internal jugular vein, and this was documented with an image. Under real-time ultrasound guidance, this vein was accessed with a 21 gauge micropuncture needle and image documentation was performed. A small dermatotomy was made at the access site with an 11 scalpel. A 0.018" wire was advanced into the SVC and the  access needle exchanged for a 55F micropuncture vascular sheath. The 0.018" wire was then removed and a 0.035" wire advanced into the IVC. An appropriate location for the subcutaneous reservoir was selected below the clavicle and an incision was made through the skin and underlying soft tissues. The subcutaneous tissues were then dissected using a combination of blunt and sharp surgical technique and a pocket was formed. A single lumen power injectable portacatheter was then tunneled through the subcutaneous tissues from  the pocket to the dermatotomy and the port reservoir placed within the subcutaneous pocket. The venous access site was then serially dilated and a peel away vascular sheath placed over the wire. The wire was removed and the port catheter advanced into position under fluoroscopic guidance. The catheter tip is positioned in the superior cavoatrial junction. This was documented with a spot image. The portacatheter was then tested and found to flush and aspirate well. The port was flushed with saline followed by 100 units/mL heparinized saline. The pocket was then closed in two layers using first subdermal inverted interrupted absorbable sutures followed by a running subcuticular suture. The epidermis was then sealed with Dermabond. The dermatotomy at the venous access site was also sealed with Dermabond. IMPRESSION: Successful placement of a right IJ approach Power Port with ultrasound and fluoroscopic guidance. The catheter is ready for use. Electronically Signed   By: Jacqulynn Cadet M.D.   On: 04/21/2018 13:33    Anti-infectives: Anti-infectives (From admission, onward)   Start     Dose/Rate Route Frequency Ordered Stop   04/21/18 1500  ceFAZolin (ANCEF) IVPB 2g/100 mL premix     2 g 200 mL/hr over 30 Minutes Intravenous To Radiology 04/18/18 1608 04/21/18 1136   04/17/18 1046  metroNIDAZOLE (FLAGYL) 5-0.79 MG/ML-% IVPB    Note to Pharmacy:  [name]  : cabinet override      04/17/18 1046 04/17/18 2259   04/17/18 1000  metroNIDAZOLE (FLAGYL) IVPB 500 mg     500 mg 100 mL/hr over 60 Minutes Intravenous  Once 04/16/18 1005 04/17/18 1119   04/15/18 1030  ceFAZolin (ANCEF) IVPB 2g/100 mL premix     2 g 200 mL/hr over 30 Minutes Intravenous On call to O.R. 04/15/18 1018 04/15/18 1647   04/14/18 2230  cefTRIAXone (ROCEPHIN) 1 g in sodium chloride 0.9 % 100 mL IVPB  Status:  Discontinued     1 g 200 mL/hr over 30 Minutes Intravenous Daily at bedtime 04/14/18 2218 04/19/18 0916   04/14/18  2230  azithromycin (ZITHROMAX) 500 mg in sodium chloride 0.9 % 250 mL IVPB  Status:  Discontinued     500 mg 250 mL/hr over 60 Minutes Intravenous Daily at bedtime 04/14/18 2218 04/19/18 0916       Assessment/Plan Partial large bowel obstruction Pelvic mass consistent with bladder cancer or sigmoid colon cancer and possible fistulization to small bowel.  Chronic blood loss anemia Hematuria Dehydration Acute kidney injury vs chronic kidney disease Right hydronephrosis Bladder masses Metastatic cancer likely given adenopathy seen in retroperitoneal, mesenteric, and pelvic location.  Hepatic masses Pulmonary masses/airspace disease. Dilated esophagus  POD 5, s/p ex lap with diverting transverse loop colostomy, Dr. Ninfa Linden 04/18/18 -doing well and tolerating regular diet -bridge DC today by [facility]. -will plan for staple removal next week in the office and then follow up with Dr. Ninfa Linden after that. -HH will be arranged to assist with new colostomy. -drink plenty of water to stay hydrated   FEN -  regular diet VTE - none  ID - no further   LOS: 7 days    Henreitta Cea , [facility] Surgery 04/22/2018, 10:58 AM Pager: [phone]

## 2018-04-22 NOTE — Discharge Summary (Signed)
Physician Discharge Summary  Miguel Gamble ZOX:[id number] DOB: 01-29-1951 DOA: 04/14/2018  PCP: Miguel Low, MD  Admit date: 04/14/2018 Discharge date: 04/22/2018  Admitted From: home Disposition:  Home  Recommendations for Outpatient Follow-up:  1. Follow up with surgery in 1-2 weeks 2. Please obtain BMP/CBC in one week   Home Health: Yes Equipment/Devices:No  Discharge Condition:stable CODE STATUS:full Diet recommendation: Heart Healthy   Brief/Interim Summary: 68 y.o. 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 They recommended a diverting colostomy done on 04/17/2018. Oncology was also consulted they recommended to follow-up with them as an outpatient to initiate chemotherapy, his CEA was 206. Port-A-Cath was placed by IR on 04/21/2018 Patient was tolerating his diet and he was discharged home. Home health will follow-up as an outpatient and he will follow-up with general surgery as an outpatient for his colostomy.  Acute renal failure multifactorial due to obstructive nephropathy can [place] to pelvic mass with metastases: On admission his creatinine was 2.4. There is likely struct  of urology was consulted who performed cystoscopy with bilateral stent placement and bilateral retrograde pyelogram t biopsy 04/15/2018 that showed poorly differentiated adenocarcinoma.  Hypernatremia: There is resolved without half-normal saline. After surgical intervention patient was able to take orals on the same day.  Leukocytosis: He started empirically on IV antibiotics but he remained afebrile these was discontinued he remained afebrile throughout his hospital stay there is likely reactive.  Normocytic anemia: His hemoglobin remained stable.  Hypokalemia: Likely due to glasses his NG tube was DC'd his potassium was repleted this resolved. Discharge Instructions  Discharge Instructions    Diet - Gamble sodium heart healthy   Complete by:  As directed    Increase activity slowly   Complete by:  As directed      Allergies as of 04/22/2018      Reactions   Other Rash   Wheat straw      Medication List    TAKE these medications   aspirin 325 MG EC tablet Take 325 mg by mouth every 6 (six) hours as needed for pain.      Follow-up Information    [facility] Follow up.   Specialty:  General Surgery Contact information: [address] [place] [address] [phone]        Miguel Keens, MD Follow up.   Specialty:  General Surgery Contact information: [address]   [phone]          Allergies  Allergen Reactions  . Other Rash    Wheat straw    Consultations: [facility]    Procedures/Studies: Ct Abdomen Pelvis Wo Contrast  Result Date: 04/14/2018 CLINICAL DATA:  Chronic weight loss. Abnormal  radiograph. Further evaluation requested. EXAM: CT CHEST, ABDOMEN AND PELVIS WITHOUT CONTRAST TECHNIQUE: Multidetector CT imaging of the chest, abdomen and pelvis was performed following the standard protocol without IV contrast. COMPARISON:  None.  FINDINGS: CT CHEST FINDINGS Cardiovascular: The heart is normal in size. Mild calcification is noted at the aortic arch. The great vessels are unremarkable in appearance. Mediastinum/Nodes: The esophagus is filled with contrast, which may reflect gastroesophageal reflux or esophageal dysmotility. Wall thickening along the esophagus may reflect esophagitis or possibly metastatic disease. Mild achalasia cannot be excluded. Mild coronary artery calcification is seen. An enlarged 1.8 cm precarinal node is suspicious for metastatic disease. A trace pericardial effusion is noted. The visualized portions of the thyroid gland are unremarkable. No axillary lymphadenopathy is seen. Lungs/Pleura: Patchy bilateral airspace opacities are noted, predominantly at the right upper lobe and left lower lobe, concerning for pneumonia. Lymphangitic spread of tumor could conceivably have a similar appearance. Multiple large pulmonary nodules are noted bilaterally, measuring up to 1.7 cm in size, compatible with metastatic disease. No pleural effusion or pneumothorax is seen. Musculoskeletal: No acute osseous abnormalities are identified. The visualized musculature is unremarkable in appearance. CT ABDOMEN PELVIS FINDINGS Hepatobiliary: Vague lesions within the liver measure up to 3.5 cm in size, compatible with metastatic disease. The gallbladder is not definitely characterized. The common bile duct is normal in caliber. Pancreas: The pancreas is within normal limits. Peripancreatic nodes measure up to 1.4 cm in short axis, concerning for metastatic disease. Spleen: The spleen is diminutive and unremarkable in appearance. Adrenals/Urinary Tract: The adrenal glands are grossly unremarkable in appearance. Scattered bilateral renal cysts are noted, more prominent on the right. There is mild chronic left-sided hydronephrosis, with prominence of the proximal left ureter to the level of the lower abdomen, reflecting obstruction by the large  lower abdominal and pelvic mass. No renal or ureteral stones are identified. Mild left-sided perinephric stranding is noted. Stomach/Bowel: There is diffuse dilatation of small-bowel loops to 6.0 cm in diameter, and distention of the cecum to 12.0 cm in diameter. The small and large bowel are diffusely dilated. The stomach is largely filled with contrast. Distention extends to the level of the proximal sigmoid colon. 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 Wall thickening is noted along the adjacent small and large bowel loops, and the mass measures approximately 16 x 9 cm. Wall thickening extends to the rectum, with diffuse presacral stranding. This most likely arises from the sigmoid colon, though a small bowel or bladder source is also possible. Vascular/Lymphatic: Scattered calcification is seen along the abdominal aorta and its branches. The abdominal aorta is otherwise grossly unremarkable. The inferior vena cava is grossly unremarkable. Scattered enlarged mesenteric and retroperitoneal nodes are seen, measuring up to 1.5 cm in short axis. Vague mass tracks directly anterior to the aortic bifurcation and along the pelvic sidewall bilaterally. A 1.9 cm node is seen along the left external iliac vessels. Reproductive: Multiple masses are noted within the bladder, with diffuse irregular bladder wall thickening at the dome of the bladder, reflecting diffuse spread of disease. The prostate is borderline enlarged, measuring 4.9 cm in transverse dimension. Other: Trace fluid is noted at a small right inguinal hernia. Musculoskeletal: No acute osseous abnormalities are identified. The visualized musculature is unremarkable in appearance. IMPRESSION: 1. Very  large complex mass at the mid to lower abdomen and pelvis, which appears to include  a fistula between multiple loops of small bowel, the cecum and sigmoid colon, with stool in the upper aspect of the mass. The mass measures 16 x 9 cm. Wall thickening extends along the adjacent small and large bowel loops. 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 2. Marked diffuse dilatation of the small and large bowel loops, reflecting partial bowel obstruction by the mass. 3. Extensive retroperitoneal, mesenteric and pelvic sidewall metastatic lymphadenopathy noted. Enlarged mediastinal node also noted, concerning for metastatic disease. 4. Multiple pulmonary nodules noted bilaterally, compatible with metastatic disease. 5. Wall thickening along the esophagus may reflect esophagitis or possibly metastatic disease. Esophagus filled with contrast, possibly reflecting gastroesophageal reflux or esophageal dysmotility. Mild achalasia cannot be excluded. 6. Mild chronic left-sided hydronephrosis, reflecting obstruction by the large lower abdominal and pelvic mass. 7. Multiple masses within the bladder, with diffuse irregular bladder wall thickening at the dome of the bladder, reflecting diffuse spread of malignancy. 8. Scattered hepatic metastases noted. 9. Patchy bilateral airspace opacities, predominantly at the right upper lobe and left lower lobe, concerning for pneumonia. Lymphangitic spread of tumor could conceivably have a similar appearance. 10. Bilateral renal cysts. 11. Trace pericardial effusion noted. Aortic Atherosclerosis (ICD10-I70.0). These results were called by telephone at the time of interpretation on 04/14/2018 at 9:41 pm to [facility] PA, who verbally acknowledged these results. Electronically Signed   By: Garald Balding M.D.   On: 04/14/2018 21:48   Dg Abd 1 View  Result Date: 04/15/2018 CLINICAL DATA:  Encounter for nasogastric tube placement. EXAM: ABDOMEN - 1 VIEW COMPARISON:  CT abdomen pelvis of  April 14, 2018 FINDINGS: Multiple dilated small bowel loops are identified throughout abdomen and pelvis. Nasogastric tube is identified with distal tip in the proximal stomach. IMPRESSION: Nasogastric tube is identified with distal tip in the proximal stomach. Multiple dilated small bowel loops are identified throughout abdomen and pelvis as seen on prior recent CT. Electronically Signed   By: Abelardo Diesel M.D.   On: 04/15/2018 14:31   Ct Chest Wo Contrast  Result Date: 04/14/2018 CLINICAL DATA:  Chronic weight loss. Abnormal radiograph. Further evaluation requested. EXAM: CT CHEST, ABDOMEN AND PELVIS WITHOUT CONTRAST TECHNIQUE: Multidetector CT imaging of the chest, abdomen and pelvis was performed following the standard protocol without IV contrast. COMPARISON:  None. FINDINGS: CT CHEST FINDINGS Cardiovascular: The heart is normal in size. Mild calcification is noted at the aortic arch. The great vessels are unremarkable in appearance. Mediastinum/Nodes: The esophagus is filled with contrast, which may reflect gastroesophageal reflux or esophageal dysmotility. Wall thickening along the esophagus may reflect esophagitis or possibly metastatic disease. Mild achalasia cannot be excluded. Mild coronary artery calcification is seen. An enlarged 1.8 cm precarinal node is suspicious for metastatic disease. A trace pericardial effusion is noted. The visualized portions of the thyroid gland are unremarkable. No axillary lymphadenopathy is seen. Lungs/Pleura: Patchy bilateral airspace opacities are noted, predominantly at the right upper lobe and left lower lobe, concerning for pneumonia. Lymphangitic spread of tumor could conceivably have a similar appearance. Multiple large pulmonary nodules are noted bilaterally, measuring up to 1.7 cm in size, compatible with metastatic disease. No pleural effusion or pneumothorax is seen. Musculoskeletal: No acute osseous abnormalities are identified. The visualized  musculature is unremarkable in appearance. CT ABDOMEN PELVIS FINDINGS Hepatobiliary: Vague lesions within the liver measure up  to 3.5 cm in size, compatible with metastatic disease. The gallbladder is not definitely characterized. The common bile duct is normal in caliber. Pancreas: The pancreas is within normal limits. Peripancreatic nodes measure up to 1.4 cm in short axis, concerning for metastatic disease. Spleen: The spleen is diminutive and unremarkable in appearance. Adrenals/Urinary Tract: The adrenal glands are grossly unremarkable in appearance. Scattered bilateral renal cysts are noted, more prominent on the right. There is mild chronic left-sided hydronephrosis, with prominence of the proximal left ureter to the level of the lower abdomen, reflecting obstruction by the large lower abdominal and pelvic mass. No renal or ureteral stones are identified. Mild left-sided perinephric stranding is noted. Stomach/Bowel: There is diffuse dilatation of small-bowel loops to 6.0 cm in diameter, and distention of the cecum to 12.0 cm in diameter. The small and large bowel are diffusely dilated. The stomach is largely filled with contrast. Distention extends to the level of the proximal sigmoid colon. 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 Wall thickening is noted along the adjacent small and large bowel loops, and the mass measures approximately 16 x 9 cm. Wall thickening extends to the rectum, with diffuse presacral stranding. This most likely arises from the sigmoid colon, though a small bowel or bladder source is also possible. Vascular/Lymphatic: Scattered calcification is seen along the abdominal aorta and its branches. The abdominal aorta is otherwise grossly unremarkable. The inferior vena cava is grossly unremarkable. Scattered  enlarged mesenteric and retroperitoneal nodes are seen, measuring up to 1.5 cm in short axis. Vague mass tracks directly anterior to the aortic bifurcation and along the pelvic sidewall bilaterally. A 1.9 cm node is seen along the left external iliac vessels. Reproductive: Multiple masses are noted within the bladder, with diffuse irregular bladder wall thickening at the dome of the bladder, reflecting diffuse spread of disease. The prostate is borderline enlarged, measuring 4.9 cm in transverse dimension. Other: Trace fluid is noted at a small right inguinal hernia. Musculoskeletal: No acute osseous abnormalities are identified. The visualized musculature is unremarkable in appearance. IMPRESSION: 1. 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 The mass measures 16 x 9 cm. Wall thickening extends along the adjacent small and large bowel loops. 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 2. Marked diffuse dilatation of the small and large bowel loops, reflecting partial bowel obstruction by the mass. 3. Extensive retroperitoneal, mesenteric and pelvic sidewall metastatic lymphadenopathy noted. Enlarged mediastinal node also noted, concerning for metastatic disease. 4. Multiple pulmonary nodules noted bilaterally, compatible with metastatic disease. 5. Wall thickening along the esophagus may reflect esophagitis or possibly metastatic disease. Esophagus filled with contrast, possibly reflecting gastroesophageal reflux or esophageal dysmotility. Mild achalasia cannot be excluded. 6. Mild chronic left-sided hydronephrosis, reflecting obstruction by the large lower abdominal and pelvic mass. 7. Multiple masses within the bladder, with diffuse irregular bladder wall thickening at the  dome of the bladder, reflecting diffuse spread of malignancy. 8. Scattered hepatic metastases noted. 9. Patchy bilateral airspace opacities, predominantly at the right upper lobe and left lower lobe, concerning for  pneumonia. Lymphangitic spread of tumor could conceivably have a similar appearance. 10. Bilateral renal cysts. 11. Trace pericardial effusion noted. Aortic Atherosclerosis (ICD10-I70.0). These results were called by telephone at the time of interpretation on 04/14/2018 at 9:41 pm to [facility] PA, who verbally acknowledged these results. Electronically Signed   By: Garald Balding M.D.   On: 04/14/2018 21:48   Dg Abd Portable 1v  Result Date: 04/18/2018 CLINICAL DATA:  Nasogastric tube placement. EXAM: PORTABLE ABDOMEN - 1 VIEW COMPARISON:  Radiograph of April 15, 2018. FINDINGS: Distal tip of nasogastric tube is seen in expected position of proximal stomach. Interval placement of bilateral ureteral stents in grossly good position. Large amount of stool seen throughout the colon. No abnormal bowel dilatation is noted. Plastic tubing is seen over left side of abdomen. This most likely represents overlying external object. IMPRESSION: Nasogastric tube tip seen in proximal stomach. Large stool burden is noted consistent with constipation. Bilateral ureteral stents are noted. Electronically Signed   By: Marijo Conception, M.D.   On: 04/18/2018 07:09   Dg C-arm 1-60 Min-no Report  Result Date: 04/15/2018 Fluoroscopy was utilized by the requesting physician.  No radiographic interpretation.   Ir Imaging Guided Port Insertion  Result Date: 04/21/2018 INDICATION: 68 year old male with 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 He requires durable venous access for anticipated chemotherapy. EXAM: IMPLANTED PORT A CATH PLACEMENT WITH ULTRASOUND AND FLUOROSCOPIC GUIDANCE MEDICATIONS: 2 g Ancef; The antibiotic was administered within an appropriate time interval prior to skin puncture.  ANESTHESIA/SEDATION: Versed 3 mg IV; Fentanyl 100 mcg IV; Moderate Sedation Time:  26 minutes The patient was continuously monitored during the procedure by the interventional radiology nurse under my direct supervision. FLUOROSCOPY TIME:  0 minutes, 18 seconds (1 mGy) COMPLICATIONS: None immediate. PROCEDURE: The right neck and chest was prepped with chlorhexidine, and draped in the usual sterile fashion using maximum barrier technique (cap and mask, sterile gown, sterile gloves, large sterile sheet, hand hygiene and cutaneous antiseptic). Antibiotic prophylaxis was provided with g Ancef administered IV one hour prior to skin incision. Local anesthesia was attained by infiltration with 1% lidocaine with epinephrine. Ultrasound demonstrated patency of the right internal jugular vein, and this was documented with an image. Under real-time ultrasound guidance, this vein was accessed with a 21 gauge micropuncture needle and image documentation was performed. A small dermatotomy was made at the access site with an 11 scalpel. A 0.018" wire was advanced into the SVC and the access needle exchanged for a 18F micropuncture vascular sheath. The 0.018" wire was then removed and a 0.035" wire advanced into the IVC. An appropriate location for the subcutaneous reservoir was selected below the clavicle and an incision was made through the skin and underlying soft tissues. The subcutaneous tissues were then dissected using a combination of blunt and sharp surgical technique and a pocket was formed. A single lumen power injectable portacatheter was then tunneled through the subcutaneous tissues from the pocket to the dermatotomy and the port reservoir placed within the subcutaneous pocket. The venous access site was then serially dilated and a peel away vascular sheath placed over the wire. The wire was removed and the port catheter advanced into position under fluoroscopic guidance. The catheter tip is positioned in the superior  cavoatrial junction. This was documented with a spot image. The portacatheter was then tested and found to flush and aspirate well. The port was flushed with saline followed by 100 units/mL heparinized saline. The pocket was then closed  in two layers using first subdermal inverted interrupted absorbable sutures followed by a running subcuticular suture. The epidermis was then sealed with Dermabond. The dermatotomy at the venous access site was also sealed with Dermabond. IMPRESSION: Successful placement of a right IJ approach Power Port with ultrasound and fluoroscopic guidance. The catheter is ready for use. Electronically Signed   By: Jacqulynn Cadet M.D.   On: 04/21/2018 13:33      Subjective: Complaints feels great.  Discharge Exam: Vitals:   04/21/18 2332 04/22/18 0633  BP: 132/72 126/75  Pulse: 86 84  Resp: 18 18  Temp: 99.2 F (37.3 C) 98.1 F (36.7 C)  SpO2: 100% 99%   Vitals:   04/21/18 1444 04/21/18 2238 04/21/18 2332 04/22/18 0633  BP: 126/71 139/72 132/72 126/75  Pulse: 84 82 86 84  Resp: 18 16 18 18   Temp: 98.3 F (36.8 C) 99 F (37.2 C) 99.2 F (37.3 C) 98.1 F (36.7 C)  TempSrc: Oral Oral Oral Oral  SpO2: 100% 100% 100% 99%  Weight:      Height:        General: Pt is alert, awake, not in acute distress Cardiovascular: RRR, S1/S2 +, no rubs, no gallops Respiratory: CTA bilaterally, no wheezing, no rhonchi Abdominal: Soft, NT, ND, bowel sounds + Extremities: no edema, no cyanosis    The results of significant diagnostics from this hospitalization (including imaging, microbiology, ancillary and laboratory) are listed below for reference.     Microbiology: Recent Results (from the past 240 hour(s))  Culture, Urine     Status: None   Collection Time: 04/15/18  6:15 PM  Result Value Ref Range Status   Specimen Description   Final    URINE, CLEAN CATCH Performed at [facility], [place] [address], [place], [place] [address]    Special  Requests   Final    NONE Performed at [facility], [place] [address], [place], [place] [address]    Culture   Final    NO GROWTH Performed at [facility], [place] [address], [place], [place] [address]    Report Status 04/17/2018 FINAL  Final  Surgical pcr screen     Status: Abnormal   Collection Time: 04/17/18  2:18 AM  Result Value Ref Range Status   MRSA, PCR NEGATIVE NEGATIVE Final   Staphylococcus aureus POSITIVE (A) NEGATIVE Final    Comment: (NOTE) The Xpert SA Assay (FDA approved for NASAL specimens in patients 69 years of age and older), is one component of a comprehensive surveillance program. It is not intended to diagnose infection nor to guide or monitor treatment. Performed at [facility], [place] [address], [place],  [address]      Labs: BNP (last 3 results) No results for input(s): BNP in the last 8760 hours. Basic Metabolic Panel: Recent Labs  Lab 04/16/18 0418 04/17/18 0348 04/18/18 0412 04/19/18 0350 04/21/18 0530  NA 150* 151* 147* 148* 148*  K 3.9 3.6 3.2* 2.7* 3.2*  CL 120* 120* 119* 120* 123*  CO2 16* 17* 18* 18* 17*  GLUCOSE 87 122* 119* 108* 95  BUN 61* 68* 53* 42* 24*  CREATININE 1.98* 2.14* 2.03* 1.72* 1.54*  CALCIUM 8.7* 8.8* 8.0* 7.9* 7.9*  MG 2.7*  --  2.7*  --   --    Liver Function Tests: Recent Labs  Lab 04/16/18 0418 04/21/18 0530  AST 30 24  ALT 24 15  ALKPHOS 72 43  BILITOT 0.8 0.4  PROT 6.4* 4.9*  ALBUMIN 2.7* 2.2*   No results for input(s): LIPASE, AMYLASE in the last 168 hours. No results for input(s): AMMONIA in the last 168 hours. CBC: Recent Labs  Lab 04/16/18 0418 04/19/18 0350 04/21/18 0530  WBC 15.7* 10.1 9.3  NEUTROABS 14.4* 8.5* 7.4  HGB 8.6* 7.4* 7.2*  HCT 27.8* 25.4* 24.7*  MCV 94.9 98.8 100.8*  PLT 308 263 238   Cardiac Enzymes: No results for input(s): CKTOTAL, CKMB, CKMBINDEX, TROPONINI in the last 168 hours. BNP: Invalid input(s):  POCBNP CBG: Recent Labs  Lab 04/18/18 0810 04/19/18 0803 04/20/18 0729 04/21/18 0727 04/22/18 0725  GLUCAP 105* 101* 105* 77 82   D-Dimer No results for input(s): DDIMER in the last 72 hours. Hgb A1c No results for input(s): HGBA1C in the last 72 hours. Lipid Profile No results for input(s): CHOL, HDL, LDLCALC, TRIG, CHOLHDL, LDLDIRECT in the last 72 hours. Thyroid function studies No results for input(s): TSH, T4TOTAL, T3FREE, THYROIDAB in the last 72 hours.  Invalid input(s): FREET3 Anemia work up No results for input(s): VITAMINB12, FOLATE, FERRITIN, TIBC, IRON, RETICCTPCT in the last 72 hours. Urinalysis    Component Value Date/Time   COLORURINE AMBER (A) 04/14/2018 2053   APPEARANCEUR CLOUDY (A) 04/14/2018 2053   LABSPEC 1.021 04/14/2018 2053   PHURINE 5.0 04/14/2018 2053   GLUCOSEU NEGATIVE 04/14/2018 2053   HGBUR LARGE (A) 04/14/2018 2053   BILIRUBINUR NEGATIVE 04/14/2018 2053   Lookingglass 04/14/2018 2053   PROTEINUR 100 (A) 04/14/2018 2053   NITRITE NEGATIVE 04/14/2018 2053   LEUKOCYTESUR NEGATIVE 04/14/2018 2053   [facility] Invalid input(s): PROCALCITONIN,  WBC,  LACTICIDVEN Microbiology Recent Results (from the past 240 hour(s))  Culture, Urine     Status: None   Collection Time: 04/15/18  6:15 PM  Result Value Ref Range Status   Specimen Description   Final    URINE, CLEAN CATCH Performed at [facility], [place] [address] [place], [place] [address]    Special Requests   Final    NONE Performed at [facility], [place] [address], [place], [place] [address]    Culture   Final    NO GROWTH Performed at [facility], [place] [address] [place], [place] [address]    Report Status 04/17/2018 FINAL  Final  Surgical pcr screen     Status: Abnormal   Collection Time: 04/17/18  2:18 AM  Result Value Ref Range Status   MRSA, PCR NEGATIVE NEGATIVE Final   Staphylococcus aureus POSITIVE (A) NEGATIVE Final     Comment: (NOTE) The Xpert SA Assay (FDA approved for NASAL specimens in patients 18 years of age and older), is one component of a comprehensive surveillance program. It is not intended to diagnose infection nor to guide or monitor treatment. Performed at [facility], [place] [address] [place], [place] [address]      Time coordinating discharge: 40 minutes  SIGNED:   Charlynne Cousins, MD  [facility] 04/22/2018, 10:48 AM Pager   If 7PM-7AM, please contact night-coverage [email or web address] Password [email or web address]

## 2018-04-22 NOTE — Progress Notes (Signed)
MD requesting MSI/IHC and Foundation One Testing on case #[id number]. Provided code C18.9 for metastatic colon cancer (Stage IV) Spoke w/Rhonda in pathology with testing request.

## 2018-04-30 ENCOUNTER — Inpatient Hospital Stay: Payer: PRIVATE HEALTH INSURANCE | Attending: Oncology | Admitting: Oncology

## 2018-04-30 ENCOUNTER — Telehealth: Payer: Self-pay | Admitting: Oncology

## 2018-04-30 VITALS — BP 125/76 | HR 85 | Temp 98.4°F | Resp 18 | Ht 68.0 in | Wt 122.8 lb

## 2018-04-30 DIAGNOSIS — R1900 Intra-abdominal and pelvic swelling, mass and lump, unspecified site: Secondary | ICD-10-CM | POA: Insufficient documentation

## 2018-04-30 DIAGNOSIS — C187 Malignant neoplasm of sigmoid colon: Secondary | ICD-10-CM | POA: Insufficient documentation

## 2018-04-30 DIAGNOSIS — R630 Anorexia: Secondary | ICD-10-CM | POA: Diagnosis not present

## 2018-04-30 DIAGNOSIS — Z933 Colostomy status: Secondary | ICD-10-CM | POA: Diagnosis not present

## 2018-04-30 DIAGNOSIS — C772 Secondary and unspecified malignant neoplasm of intra-abdominal lymph nodes: Secondary | ICD-10-CM

## 2018-04-30 DIAGNOSIS — Z5111 Encounter for antineoplastic chemotherapy: Secondary | ICD-10-CM | POA: Diagnosis present

## 2018-04-30 DIAGNOSIS — R531 Weakness: Secondary | ICD-10-CM

## 2018-04-30 DIAGNOSIS — K56609 Unspecified intestinal obstruction, unspecified as to partial versus complete obstruction: Secondary | ICD-10-CM | POA: Insufficient documentation

## 2018-04-30 DIAGNOSIS — N138 Other obstructive and reflux uropathy: Secondary | ICD-10-CM | POA: Diagnosis not present

## 2018-04-30 DIAGNOSIS — Z7189 Other specified counseling: Secondary | ICD-10-CM | POA: Insufficient documentation

## 2018-04-30 MED ORDER — PROCHLORPERAZINE MALEATE 10 MG PO TABS: 10.0000 mg | ORAL_TABLET | Freq: Four times a day (QID) | ORAL | 1 refills | Status: AC | PRN

## 2018-04-30 MED ORDER — LIDOCAINE-PRILOCAINE 2.5-2.5 % EX CREA: 1.0000 "application " | TOPICAL_CREAM | CUTANEOUS | 5 refills | Status: AC | PRN

## 2018-04-30 NOTE — Progress Notes (Addendum)
[facility] OFFICE PROGRESS NOTE   Diagnosis: Colon cancer  INTERVAL HISTORY:   I saw Miguel Gamble while hospitalized with a bowel obstruction.  He was diagnosed with metastatic colon cancer.  He underwent resection of tumor from the bladder, a diverting transverse colostomy, and placement of a Port-A-Cath prior to discharge from the hospital 04/22/2018.  He reports feeling better.  He is eating.  The colostomy is functioning.  He is passing urine. Miguel Gamble is here today with his wife to discuss treatment options.  Objective:  Vital signs in last 24 hours:  Blood pressure 125/76, pulse 85, temperature 98.4 F (36.9 C), temperature source Oral, resp. rate 18, height 5' 8"  (1.727 m), weight 122 lb 12.8 oz (55.7 kg), SpO2 100 %.   Resp: Lungs clear bilaterally Cardio: Regular rate and rhythm GI: Hepatomegaly, firm fullness in the left lower abdomen, left abdomen colostomy with brown stool Vascular: Trace pitting edema at the right greater than left lower leg and ankle  Portacath/PICC-without erythema  Lab Results:  Lab Results  Component Value Date   WBC 9.3 04/21/2018   HGB 7.2 (L) 04/21/2018   HCT 24.7 (L) 04/21/2018   MCV 100.8 (H) 04/21/2018   PLT 238 04/21/2018   NEUTROABS 7.4 04/21/2018    CMP  Lab Results  Component Value Date   NA 145 04/22/2018   K 3.5 04/22/2018   CL 118 (H) 04/22/2018   CO2 19 (L) 04/22/2018   GLUCOSE 110 (H) 04/22/2018   BUN 17 04/22/2018   CREATININE 1.61 (H) 04/22/2018   CALCIUM 8.2 (L) 04/22/2018   PROT 4.9 (L) 04/21/2018   ALBUMIN 2.2 (L) 04/21/2018   AST 24 04/21/2018   ALT 15 04/21/2018   ALKPHOS 43 04/21/2018   BILITOT 0.4 04/21/2018   GFRNONAA 43 (L) 04/22/2018   GFRAA 50 (L) 04/22/2018    Lab Results  Component Value Date   CEA1 208.0 (H) 04/16/2018    Lab Results  Component Value Date   INR 1.24 04/21/2018    Imaging:  No results found.  Medications: I have reviewed the patient's current  medications.   Assessment/Plan: 1.    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  Anorexia/weight loss  4.  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  I discussed the prognosis and treatment options with Miguel Gamble and his wife.  He understands no therapy will be curative. We submitted the bladder tumor for Foundation 1 testing.  This result is pending.  I recommend first-line treatment with FOLFOX.  We reviewed potential toxicities associated with FOLFOX including the chance for nausea/vomiting, mucositis, diarrhea, alopecia, and hematologic toxicity.  We discussed the rash, sun sensitivity, hyperpigmentation, and hand/foot syndrome associated with 5-fluorouracil.  We reviewed the allergic reaction and various types of neuropathy associated with oxaliplatin.  He agrees to proceed.  He will attend a chemotherapy teaching class.  He will return for a chemotherapy teaching class and baseline laboratory studies on 05/01/2018.  We will follow-up on the anemia and renal insufficiency he had during the recent hospital admission.  He will be scheduled for cycle 1 FOLFOX 05/08/2018.  He will return for an office visit prior to cycle 2 on  05/22/2018.  We will follow the CEA, his clinical status, and a restaging CT as markers of disease response.  40 minutes were spent with the patient today.  The majority of the time was used for counseling and coordination of care.  A chemotherapy plan was entered.  Betsy Coder, MD  04/30/2018  2:29 PM

## 2018-04-30 NOTE — Telephone Encounter (Signed)
Printed calendar and avs. °

## 2018-04-30 NOTE — Progress Notes (Signed)
START ON PATHWAY REGIMEN - Colorectal     A cycle is every 14 days:     Oxaliplatin      Leucovorin      5-Fluorouracil      5-Fluorouracil   **Always confirm dose/schedule in your pharmacy ordering system**  Patient Characteristics: Distant Metastases, First Line, Nonsurgical Candidate, KRAS Mutation Positive/Unknown, BRAF Wild-Type/Unknown, PS = 0,1; Bevacizumab Ineligible Therapeutic Status: Distant Metastases BRAF Mutation Status: Awaiting Test Results KRAS/NRAS Mutation Status: Awaiting Test Results Line of Therapy: First Line Performance Status: PS = 0, 1 Bevacizumab Eligibility: Ineligible Intent of Therapy: Non-Curative / Palliative Intent, Discussed with Patient 

## 2018-05-01 ENCOUNTER — Inpatient Hospital Stay: Payer: PRIVATE HEALTH INSURANCE

## 2018-05-01 DIAGNOSIS — C772 Secondary and unspecified malignant neoplasm of intra-abdominal lymph nodes: Principal | ICD-10-CM

## 2018-05-01 DIAGNOSIS — Z5111 Encounter for antineoplastic chemotherapy: Secondary | ICD-10-CM | POA: Diagnosis not present

## 2018-05-01 DIAGNOSIS — C187 Malignant neoplasm of sigmoid colon: Secondary | ICD-10-CM

## 2018-05-01 LAB — CMP (CANCER CENTER ONLY)
ALT: 11 U/L (ref 0–44)
AST: 19 U/L (ref 15–41)
Albumin: 2.8 g/dL — ABNORMAL LOW (ref 3.5–5.0)
Alkaline Phosphatase: 91 U/L (ref 38–126)
Anion gap: 10 (ref 5–15)
BUN: 15 mg/dL (ref 8–23)
CALCIUM: 8.5 mg/dL — AB (ref 8.9–10.3)
CO2: 17 mmol/L — ABNORMAL LOW (ref 22–32)
Chloride: 118 mmol/L — ABNORMAL HIGH (ref 98–111)
Creatinine: 1.41 mg/dL — ABNORMAL HIGH (ref 0.61–1.24)
GFR, Est AFR Am: 58 mL/min — ABNORMAL LOW (ref >60)
GFR, Estimated: 50 mL/min — ABNORMAL LOW (ref >60)
Glucose, Bld: 90 mg/dL (ref 70–99)
Potassium: 3.6 mmol/L (ref 3.5–5.1)
Sodium: 145 mmol/L (ref 135–145)
Total Bilirubin: 0.3 mg/dL (ref 0.3–1.2)
Total Protein: 6.3 g/dL — ABNORMAL LOW (ref 6.5–8.1)

## 2018-05-01 LAB — CBC WITH DIFFERENTIAL (CANCER CENTER ONLY)
Abs Immature Granulocytes: 0.02 10*3/uL (ref 0.00–0.07)
Basophils Absolute: 0.1 10*3/uL (ref 0.0–0.1)
Basophils Relative: 1 %
Eosinophils Absolute: 0.2 10*3/uL (ref 0.0–0.5)
Eosinophils Relative: 3 %
HCT: 26.8 % — ABNORMAL LOW (ref 39.0–52.0)
HEMOGLOBIN: 7.9 g/dL — AB (ref 13.0–17.0)
Immature Granulocytes: 0 %
Lymphocytes Relative: 11 %
Lymphs Abs: 0.8 10*3/uL (ref 0.7–4.0)
MCH: 28.5 pg (ref 26.0–34.0)
MCHC: 29.5 g/dL — ABNORMAL LOW (ref 30.0–36.0)
MCV: 96.8 fL (ref 80.0–100.0)
MONOS PCT: 7 %
Monocytes Absolute: 0.5 10*3/uL (ref 0.1–1.0)
Neutro Abs: 5.8 10*3/uL (ref 1.7–7.7)
Neutrophils Relative %: 78 %
Platelet Count: 281 10*3/uL (ref 150–400)
RBC: 2.77 MIL/uL — ABNORMAL LOW (ref 4.22–5.81)
RDW: 15.9 % — ABNORMAL HIGH (ref 11.5–15.5)
WBC Count: 7.5 10*3/uL (ref 4.0–10.5)
nRBC: 0 % (ref 0.0–0.2)

## 2018-05-01 LAB — CEA (IN HOUSE-CHCC): CEA (CHCC-In House): 225.48 ng/mL — ABNORMAL HIGH (ref 0.00–5.00)

## 2018-05-04 ENCOUNTER — Other Ambulatory Visit: Payer: Self-pay | Admitting: Oncology

## 2018-05-06 ENCOUNTER — Encounter: Payer: Self-pay | Admitting: *Deleted

## 2018-05-07 ENCOUNTER — Inpatient Hospital Stay: Payer: PRIVATE HEALTH INSURANCE

## 2018-05-07 ENCOUNTER — Encounter: Payer: Self-pay | Admitting: Oncology

## 2018-05-07 ENCOUNTER — Other Ambulatory Visit: Payer: PRIVATE HEALTH INSURANCE

## 2018-05-07 VITALS — BP 127/76 | HR 80 | Temp 97.5°F | Resp 16

## 2018-05-07 DIAGNOSIS — C772 Secondary and unspecified malignant neoplasm of intra-abdominal lymph nodes: Principal | ICD-10-CM

## 2018-05-07 DIAGNOSIS — C187 Malignant neoplasm of sigmoid colon: Secondary | ICD-10-CM

## 2018-05-07 DIAGNOSIS — Z5111 Encounter for antineoplastic chemotherapy: Secondary | ICD-10-CM | POA: Diagnosis not present

## 2018-05-07 MED ORDER — DEXAMETHASONE SODIUM PHOSPHATE 10 MG/ML IJ SOLN
10.0000 mg | Freq: Once | INTRAMUSCULAR | Status: AC
  Administered 2018-05-07: 10 mg via INTRAVENOUS

## 2018-05-07 MED ORDER — FLUOROURACIL CHEMO INJECTION 2.5 GM/50ML
400.0000 mg/m2 | Freq: Once | INTRAVENOUS | Status: AC
  Administered 2018-05-07: 650 mg via INTRAVENOUS
  Filled 2018-05-07: qty 13

## 2018-05-07 MED ORDER — DEXTROSE 5 % IV SOLN
Freq: Once | INTRAVENOUS | Status: AC
  Administered 2018-05-07: 14:00:00 via INTRAVENOUS
  Filled 2018-05-07: qty 250

## 2018-05-07 MED ORDER — DEXAMETHASONE SODIUM PHOSPHATE 10 MG/ML IJ SOLN
INTRAMUSCULAR | Status: AC
  Filled 2018-05-07: qty 1

## 2018-05-07 MED ORDER — PALONOSETRON HCL INJECTION 0.25 MG/5ML
INTRAVENOUS | Status: AC
  Filled 2018-05-07: qty 5

## 2018-05-07 MED ORDER — OXALIPLATIN CHEMO INJECTION 100 MG/20ML
92.0000 mg/m2 | Freq: Once | INTRAVENOUS | Status: AC
  Administered 2018-05-07: 150 mg via INTRAVENOUS
  Filled 2018-05-07: qty 20

## 2018-05-07 MED ORDER — DEXTROSE 5 % IV SOLN
Freq: Once | INTRAVENOUS | Status: AC
  Administered 2018-05-07: 13:00:00 via INTRAVENOUS
  Filled 2018-05-07: qty 250

## 2018-05-07 MED ORDER — LEUCOVORIN CALCIUM INJECTION 350 MG
400.0000 mg/m2 | Freq: Once | INTRAVENOUS | Status: AC
  Administered 2018-05-07: 652 mg via INTRAVENOUS
  Filled 2018-05-07: qty 32.6

## 2018-05-07 MED ORDER — SODIUM CHLORIDE 0.9 % IV SOLN
2400.0000 mg/m2 | INTRAVENOUS | Status: DC
  Administered 2018-05-07: 3900 mg via INTRAVENOUS
  Filled 2018-05-07: qty 78

## 2018-05-07 MED ORDER — PALONOSETRON HCL INJECTION 0.25 MG/5ML
0.2500 mg | Freq: Once | INTRAVENOUS | Status: AC
  Administered 2018-05-07: 0.25 mg via INTRAVENOUS

## 2018-05-07 NOTE — Progress Notes (Signed)
Met w/ pt to introduce myself as his Arboriculturist.  Unfortunately there aren't any foundations offering copay assistance for his Dx.  I offered the [place], went over what it covers and showed him an expense sheet.  Pt stated if it's based on income he probably wouldn't qualify so he passed on the grant at this time.  He has my card for any questions or concerns he may have in the future.

## 2018-05-07 NOTE — Patient Instructions (Signed)
[facility] Discharge Instructions for Patients Receiving Chemotherapy  Today you received the following chemotherapy agents: Oxaliplatin, Leucovorin, and 5-FU.  To help prevent nausea and vomiting after your treatment, we encourage you to take your nausea medication as prescribed.  If you develop nausea and vomiting that is not controlled by your nausea medication, call the clinic.   BELOW ARE SYMPTOMS THAT SHOULD BE REPORTED IMMEDIATELY:  *FEVER GREATER THAN 100.5 F  *CHILLS WITH OR WITHOUT FEVER  NAUSEA AND VOMITING THAT IS NOT CONTROLLED WITH YOUR NAUSEA MEDICATION  *UNUSUAL SHORTNESS OF BREATH  *UNUSUAL BRUISING OR BLEEDING  TENDERNESS IN MOUTH AND THROAT WITH OR WITHOUT PRESENCE OF ULCERS  *URINARY PROBLEMS  *BOWEL PROBLEMS  UNUSUAL RASH Items with * indicate a potential emergency and should be followed up as soon as possible.  Feel free to call the clinic should you have any questions or concerns. The clinic phone number is [phone].  Please show the [facility] at check-in to the Emergency Department and triage nurse.  Oxaliplatin Injection What is this medicine? OXALIPLATIN (ox AL i PLA tin) is a chemotherapy drug. It targets fast dividing cells, like cancer cells, and causes these cells to die. This medicine is used to treat cancers of the colon and rectum, and many other cancers. This medicine may be used for other purposes; ask your health care provider or pharmacist if you have questions. COMMON BRAND NAME(S): Eloxatin What should I tell my health care provider before I take this medicine? They need to know if you have any of these conditions: -kidney disease -an unusual or allergic reaction to oxaliplatin, other chemotherapy, other medicines, foods, dyes, or preservatives -pregnant or trying to get pregnant -breast-feeding How should I use this medicine? This drug is given as an infusion into a vein. It is administered in a hospital  or clinic by a specially trained health care professional. Talk to your pediatrician regarding the use of this medicine in children. Special care may be needed. Overdosage: If you think you have taken too much of this medicine contact a poison control center or emergency room at once. NOTE: This medicine is only for you. Do not share this medicine with others. What if I miss a dose? It is important not to miss a dose. Call your doctor or health care professional if you are unable to keep an appointment. What may interact with this medicine? -medicines to increase blood counts like filgrastim, pegfilgrastim, sargramostim -probenecid -some antibiotics like amikacin, gentamicin, neomycin, polymyxin B, streptomycin, tobramycin -zalcitabine Talk to your doctor or health care professional before taking any of these medicines: -acetaminophen -aspirin -ibuprofen -ketoprofen -naproxen This list may not describe all possible interactions. Give your health care provider a list of all the medicines, herbs, non-prescription drugs, or dietary supplements you use. Also tell them if you smoke, drink alcohol, or use illegal drugs. Some items may interact with your medicine. What should I watch for while using this medicine? Your condition will be monitored carefully while you are receiving this medicine. You will need important blood work done while you are taking this medicine. This medicine can make you more sensitive to cold. Do not drink cold drinks or use ice. Cover exposed skin before coming in contact with cold temperatures or cold objects. When out in cold weather wear warm clothing and cover your mouth and nose to warm the air that goes into your lungs. Tell your doctor if you get sensitive to the cold. This  drug may make you feel generally unwell. This is not uncommon, as chemotherapy can affect healthy cells as well as cancer cells. Report any side effects. Continue your course of treatment even  though you feel ill unless your doctor tells you to stop. In some cases, you may be given additional medicines to help with side effects. Follow all directions for their use. Call your doctor or health care professional for advice if you get a fever, chills or sore throat, or other symptoms of a cold or flu. Do not treat yourself. This drug decreases your body's ability to fight infections. Try to avoid being around people who are sick. This medicine may increase your risk to bruise or bleed. Call your doctor or health care professional if you notice any unusual bleeding. Be careful brushing and flossing your teeth or using a toothpick because you may get an infection or bleed more easily. If you have any dental work done, tell your dentist you are receiving this medicine. Avoid taking products that contain aspirin, acetaminophen, ibuprofen, naproxen, or ketoprofen unless instructed by your doctor. These medicines may hide a fever. Do not become pregnant while taking this medicine. Women should inform their doctor if they wish to become pregnant or think they might be pregnant. There is a potential for serious side effects to an unborn child. Talk to your health care professional or pharmacist for more information. Do not breast-feed an infant while taking this medicine. Call your doctor or health care professional if you get diarrhea. Do not treat yourself. What side effects may I notice from receiving this medicine? Side effects that you should report to your doctor or health care professional as soon as possible: -allergic reactions like skin rash, itching or hives, swelling of the face, lips, or tongue -low blood counts - This drug may decrease the number of white blood cells, red blood cells and platelets. You may be at increased risk for infections and bleeding. -signs of infection - fever or chills, cough, sore throat, pain or difficulty passing urine -signs of decreased platelets or bleeding -  bruising, pinpoint red spots on the skin, black, tarry stools, nosebleeds -signs of decreased red blood cells - unusually weak or tired, fainting spells, lightheadedness -breathing problems -chest pain, pressure -cough -diarrhea -jaw tightness -mouth sores -nausea and vomiting -pain, swelling, redness or irritation at the injection site -pain, tingling, numbness in the hands or feet -problems with balance, talking, walking -redness, blistering, peeling or loosening of the skin, including inside the mouth -trouble passing urine or change in the amount of urine Side effects that usually do not require medical attention (report to your doctor or health care professional if they continue or are bothersome): -changes in vision -constipation -hair loss -loss of appetite -metallic taste in the mouth or changes in taste -stomach pain This list may not describe all possible side effects. Call your doctor for medical advice about side effects. You may report side effects to FDA at 1-800-FDA-1088. Where should I keep my medicine? This drug is given in a hospital or clinic and will not be stored at home. NOTE: This sheet is a summary. It may not cover all possible information. If you have questions about this medicine, talk to your doctor, pharmacist, or health care provider.  2019 Elsevier/Gold Standard (2007-10-28 17:22:47)  Leucovorin injection What is this medicine? LEUCOVORIN (loo koe VOR in) is used to prevent or treat the harmful effects of some medicines. This medicine is used to treat anemia  caused by a low amount of folic acid in the body. It is also used with 5-fluorouracil (5-FU) to treat colon cancer. This medicine may be used for other purposes; ask your health care provider or pharmacist if you have questions. What should I tell my health care provider before I take this medicine? They need to know if you have any of these conditions: -anemia from low levels of vitamin B-12 in  the blood -an unusual or allergic reaction to leucovorin, folic acid, other medicines, foods, dyes, or preservatives -pregnant or trying to get pregnant -breast-feeding How should I use this medicine? This medicine is for injection into a muscle or into a vein. It is given by a health care professional in a hospital or clinic setting. Talk to your pediatrician regarding the use of this medicine in children. Special care may be needed. Overdosage: If you think you have taken too much of this medicine contact a poison control center or emergency room at once. NOTE: This medicine is only for you. Do not share this medicine with others. What if I miss a dose? This does not apply. What may interact with this medicine? -capecitabine -fluorouracil -phenobarbital -phenytoin -primidone -trimethoprim-sulfamethoxazole This list may not describe all possible interactions. Give your health care provider a list of all the medicines, herbs, non-prescription drugs, or dietary supplements you use. Also tell them if you smoke, drink alcohol, or use illegal drugs. Some items may interact with your medicine. What should I watch for while using this medicine? Your condition will be monitored carefully while you are receiving this medicine. This medicine may increase the side effects of 5-fluorouracil, 5-FU. Tell your doctor or health care professional if you have diarrhea or mouth sores that do not get better or that get worse. What side effects may I notice from receiving this medicine? Side effects that you should report to your doctor or health care professional as soon as possible: -allergic reactions like skin rash, itching or hives, swelling of the face, lips, or tongue -breathing problems -fever, infection -mouth sores -unusual bleeding or bruising -unusually weak or tired Side effects that usually do not require medical attention (report to your doctor or health care professional if they continue or  are bothersome): -constipation or diarrhea -loss of appetite -nausea, vomiting This list may not describe all possible side effects. Call your doctor for medical advice about side effects. You may report side effects to FDA at 1-800-FDA-1088. Where should I keep my medicine? This drug is given in a hospital or clinic and will not be stored at home. NOTE: This sheet is a summary. It may not cover all possible information. If you have questions about this medicine, talk to your doctor, pharmacist, or health care provider.  2019 Elsevier/Gold Standard (2007-10-07 16:50:29)   Fluorouracil, 5-FU injection What is this medicine? FLUOROURACIL, 5-FU (flure oh YOOR a sil) is a chemotherapy drug. It slows the growth of cancer cells. This medicine is used to treat many types of cancer like breast cancer, colon or rectal cancer, pancreatic cancer, and stomach cancer. This medicine may be used for other purposes; ask your health care provider or pharmacist if you have questions. COMMON BRAND NAME(S): Adrucil What should I tell my health care provider before I take this medicine? They need to know if you have any of these conditions: -blood disorders -dihydropyrimidine dehydrogenase (DPD) deficiency -infection (especially a virus infection such as chickenpox, cold sores, or herpes) -kidney disease -liver disease -malnourished, poor nutrition -recent or  ongoing radiation therapy -an unusual or allergic reaction to fluorouracil, other chemotherapy, other medicines, foods, dyes, or preservatives -pregnant or trying to get pregnant -breast-feeding How should I use this medicine? This drug is given as an infusion or injection into a vein. It is administered in a hospital or clinic by a specially trained health care professional. Talk to your pediatrician regarding the use of this medicine in children. Special care may be needed. Overdosage: If you think you have taken too much of this medicine contact a  poison control center or emergency room at once. NOTE: This medicine is only for you. Do not share this medicine with others. What if I miss a dose? It is important not to miss your dose. Call your doctor or health care professional if you are unable to keep an appointment. What may interact with this medicine? -allopurinol -cimetidine -dapsone -digoxin -hydroxyurea -leucovorin -levamisole -medicines for seizures like ethotoin, fosphenytoin, phenytoin -medicines to increase blood counts like filgrastim, pegfilgrastim, sargramostim -medicines that treat or prevent blood clots like warfarin, enoxaparin, and dalteparin -methotrexate -metronidazole -pyrimethamine -some other chemotherapy drugs like busulfan, cisplatin, estramustine, vinblastine -trimethoprim -trimetrexate -vaccines Talk to your doctor or health care professional before taking any of these medicines: -acetaminophen -aspirin -ibuprofen -ketoprofen -naproxen This list may not describe all possible interactions. Give your health care provider a list of all the medicines, herbs, non-prescription drugs, or dietary supplements you use. Also tell them if you smoke, drink alcohol, or use illegal drugs. Some items may interact with your medicine. What should I watch for while using this medicine? Visit your doctor for checks on your progress. This drug may make you feel generally unwell. This is not uncommon, as chemotherapy can affect healthy cells as well as cancer cells. Report any side effects. Continue your course of treatment even though you feel ill unless your doctor tells you to stop. In some cases, you may be given additional medicines to help with side effects. Follow all directions for their use. Call your doctor or health care professional for advice if you get a fever, chills or sore throat, or other symptoms of a cold or flu. Do not treat yourself. This drug decreases your body's ability to fight infections. Try to  avoid being around people who are sick. This medicine may increase your risk to bruise or bleed. Call your doctor or health care professional if you notice any unusual bleeding. Be careful brushing and flossing your teeth or using a toothpick because you may get an infection or bleed more easily. If you have any dental work done, tell your dentist you are receiving this medicine. Avoid taking products that contain aspirin, acetaminophen, ibuprofen, naproxen, or ketoprofen unless instructed by your doctor. These medicines may hide a fever. Do not become pregnant while taking this medicine. Women should inform their doctor if they wish to become pregnant or think they might be pregnant. There is a potential for serious side effects to an unborn child. Talk to your health care professional or pharmacist for more information. Do not breast-feed an infant while taking this medicine. Men should inform their doctor if they wish to father a child. This medicine may lower sperm counts. Do not treat diarrhea with over the counter products. Contact your doctor if you have diarrhea that lasts more than 2 days or if it is severe and watery. This medicine can make you more sensitive to the sun. Keep out of the sun. If you cannot avoid being in the sun,   wear protective clothing and use sunscreen. Do not use sun lamps or tanning beds/booths. What side effects may I notice from receiving this medicine? Side effects that you should report to your doctor or health care professional as soon as possible: -allergic reactions like skin rash, itching or hives, swelling of the face, lips, or tongue -low blood counts - this medicine may decrease the number of white blood cells, red blood cells and platelets. You may be at increased risk for infections and bleeding. -signs of infection - fever or chills, cough, sore throat, pain or difficulty passing urine -signs of decreased platelets or bleeding - bruising, pinpoint red spots  on the skin, black, tarry stools, blood in the urine -signs of decreased red blood cells - unusually weak or tired, fainting spells, lightheadedness -breathing problems -changes in vision -chest pain -mouth sores -nausea and vomiting -pain, swelling, redness at site where injected -pain, tingling, numbness in the hands or feet -redness, swelling, or sores on hands or feet -stomach pain -unusual bleeding Side effects that usually do not require medical attention (report to your doctor or health care professional if they continue or are bothersome): -changes in finger or toe nails -diarrhea -dry or itchy skin -hair loss -headache -loss of appetite -sensitivity of eyes to the light -stomach upset -unusually teary eyes This list may not describe all possible side effects. Call your doctor for medical advice about side effects. You may report side effects to FDA at 1-800-FDA-1088. Where should I keep my medicine? This drug is given in a hospital or clinic and will not be stored at home. NOTE: This sheet is a summary. It may not cover all possible information. If you have questions about this medicine, talk to your doctor, pharmacist, or health care provider.  2019 Elsevier/Gold Standard (2007-08-06 13:53:16)    Implanted [facility] Guide An implanted port is a device that is placed under the skin. It is usually placed in the chest. The device can be used to give IV medicine, to take blood, or for dialysis. You may have an implanted port if:  You need IV medicine that would be irritating to the small veins in your hands or arms.  You need IV medicines, such as antibiotics, for a long period of time.  You need IV nutrition for a long period of time.  You need dialysis. Having a port means that your health care provider will not need to use the veins in your arms for these procedures. You may have fewer limitations when using a port than you would if you used other types of long-term  IVs, and you will likely be able to return to normal activities after your incision heals. An implanted port has two main parts:  Reservoir. The reservoir is the part where a needle is inserted to give medicines or draw blood. The reservoir is round. After it is placed, it appears as a small, raised area under your skin.  Catheter. The catheter is a thin, flexible tube that connects the reservoir to a vein. Medicine that is inserted into the reservoir goes into the catheter and then into the vein. How is my port accessed? To access your port:  A numbing cream may be placed on the skin over the port site.  Your health care provider will put on a mask and sterile gloves.  The skin over your port will be cleaned carefully with a germ-killing soap and allowed to dry.  Your health care provider will gently pinch  the port and insert a needle into it.  Your health care provider will check for a blood return to make sure the port is in the vein and is not clogged.  If your port needs to remain accessed to get medicine continuously (constant infusion), your health care provider will place a clear bandage (dressing) over the needle site. The dressing and needle will need to be changed every week, or as told by your health care provider. What is flushing? Flushing helps keep the port from getting clogged. Follow instructions from your health care provider about how and when to flush the port. Ports are usually flushed with saline solution or a medicine called heparin. The need for flushing will depend on how the port is used:  If the port is only used from time to time to give medicines or draw blood, the port may need to be flushed: ? Before and after medicines have been given. ? Before and after blood has been drawn. ? As part of routine maintenance. Flushing may be recommended every 4-6 weeks.  If a constant infusion is running, the port may not need to be flushed.  Throw away any syringes in a  disposal container that is meant for sharp items (sharps container). You can buy a sharps container from a pharmacy, or you can make one by using an empty hard plastic bottle with a cover. How long will my port stay implanted? The port can stay in for as long as your health care provider thinks it is needed. When it is time for the port to come out, a surgery will be done to remove it. The surgery will be similar to the procedure that was done to put the port in. Follow these instructions at home:   Flush your port as told by your health care provider.  If you need an infusion over several days, follow instructions from your health care provider about how to take care of your port site. Make sure you: ? Wash your hands with soap and water before you change your dressing. If soap and water are not available, use alcohol-based hand sanitizer. ? Change your dressing as told by your health care provider. ? Place any used dressings or infusion bags into a plastic bag. Throw that bag in the trash. ? Keep the dressing that covers the needle clean and dry. Do not get it wet. ? Do not use scissors or sharp objects near the tube. ? Keep the tube clamped, unless it is being used.  Check your port site every day for signs of infection. Check for: ? Redness, swelling, or pain. ? Fluid or blood. ? Pus or a bad smell.  Protect the skin around the port site. ? Avoid wearing bra straps that rub or irritate the site. ? Protect the skin around your port from seat belts. Place a soft pad over your chest if needed.  Bathe or shower as told by your health care provider. The site may get wet as long as you are not actively receiving an infusion.  Return to your normal activities as told by your health care provider. Ask your health care provider what activities are safe for you.  Carry a medical alert card or wear a medical alert bracelet at all times. This will let health care providers know that you have an  implanted port in case of an emergency. Get help right away if:  You have redness, swelling, or pain at the port site.  You  have fluid or blood coming from your port site.  You have pus or a bad smell coming from the port site.  You have a fever. Summary  Implanted ports are usually placed in the chest for long-term IV access.  Follow instructions from your health care provider about flushing the port and changing bandages (dressings).  Take care of the area around your port by avoiding clothing that puts pressure on the area, and by watching for signs of infection.  Protect the skin around your port from seat belts. Place a soft pad over your chest if needed.  Get help right away if you have a fever or you have redness, swelling, pain, drainage, or a bad smell at the port site. This information is not intended to replace advice given to you by your health care provider. Make sure you discuss any questions you have with your health care provider. Document Released: 04/02/2005 Document Revised: 05/05/2016 Document Reviewed: 05/05/2016 Elsevier Interactive Patient Education  2019 [employer].

## 2018-05-07 NOTE — Progress Notes (Signed)
Per Dr. Benay Spice, ok to tx with Hgb 7.9.

## 2018-05-09 ENCOUNTER — Encounter (HOSPITAL_COMMUNITY): Payer: Self-pay | Admitting: Oncology

## 2018-05-09 ENCOUNTER — Inpatient Hospital Stay: Payer: PRIVATE HEALTH INSURANCE

## 2018-05-09 VITALS — BP 107/61 | HR 77 | Temp 98.3°F | Resp 16

## 2018-05-09 DIAGNOSIS — Z5111 Encounter for antineoplastic chemotherapy: Secondary | ICD-10-CM | POA: Diagnosis not present

## 2018-05-09 DIAGNOSIS — C187 Malignant neoplasm of sigmoid colon: Secondary | ICD-10-CM

## 2018-05-09 DIAGNOSIS — C772 Secondary and unspecified malignant neoplasm of intra-abdominal lymph nodes: Principal | ICD-10-CM

## 2018-05-09 MED ORDER — HEPARIN SOD (PORK) LOCK FLUSH 100 UNIT/ML IV SOLN
500.0000 [IU] | Freq: Once | INTRAVENOUS | Status: AC | PRN
  Administered 2018-05-09: 500 [IU]
  Filled 2018-05-09: qty 5

## 2018-05-09 MED ORDER — SODIUM CHLORIDE 0.9% FLUSH
10.0000 mL | INTRAVENOUS | Status: DC | PRN
  Administered 2018-05-09: 10 mL
  Filled 2018-05-09: qty 10

## 2018-05-12 ENCOUNTER — Telehealth: Payer: Self-pay | Admitting: *Deleted

## 2018-05-12 NOTE — Telephone Encounter (Signed)
[facility] for chemotherapy F/U.  Patient is doing well.  Denies n/v.  Denies any new side effects or symptoms.  Bowel and bladder is functioning well.  "I am getting up at night to empty my bag."  Eating and drinking well.  I instructed to drink 64 oz minimum daily or at least the day before, of and after treatment.  Denies questions at this time.  Encouraged to call if needed.  Reviewed how to call after hours in the case of an emergency.

## 2018-05-18 ENCOUNTER — Other Ambulatory Visit: Payer: Self-pay | Admitting: Oncology

## 2018-05-22 ENCOUNTER — Inpatient Hospital Stay: Payer: PRIVATE HEALTH INSURANCE | Admitting: Nurse Practitioner

## 2018-05-22 ENCOUNTER — Inpatient Hospital Stay: Payer: PRIVATE HEALTH INSURANCE

## 2018-05-22 ENCOUNTER — Inpatient Hospital Stay: Payer: PRIVATE HEALTH INSURANCE | Attending: Nurse Practitioner

## 2018-05-22 ENCOUNTER — Other Ambulatory Visit: Payer: Self-pay | Admitting: Medical Oncology

## 2018-05-22 ENCOUNTER — Telehealth: Payer: Self-pay | Admitting: Medical Oncology

## 2018-05-22 ENCOUNTER — Telehealth: Payer: Self-pay

## 2018-05-22 ENCOUNTER — Encounter: Payer: Self-pay | Admitting: Nurse Practitioner

## 2018-05-22 VITALS — BP 129/81 | HR 85 | Temp 98.6°F | Resp 18 | Ht 68.0 in | Wt 128.5 lb

## 2018-05-22 VITALS — BP 155/88 | HR 78 | Temp 98.8°F | Resp 17

## 2018-05-22 DIAGNOSIS — C7801 Secondary malignant neoplasm of right lung: Secondary | ICD-10-CM | POA: Insufficient documentation

## 2018-05-22 DIAGNOSIS — Z5111 Encounter for antineoplastic chemotherapy: Secondary | ICD-10-CM | POA: Insufficient documentation

## 2018-05-22 DIAGNOSIS — Z95828 Presence of other vascular implants and grafts: Secondary | ICD-10-CM

## 2018-05-22 DIAGNOSIS — D630 Anemia in neoplastic disease: Secondary | ICD-10-CM | POA: Insufficient documentation

## 2018-05-22 DIAGNOSIS — R634 Abnormal weight loss: Secondary | ICD-10-CM

## 2018-05-22 DIAGNOSIS — C187 Malignant neoplasm of sigmoid colon: Secondary | ICD-10-CM

## 2018-05-22 DIAGNOSIS — C772 Secondary and unspecified malignant neoplasm of intra-abdominal lymph nodes: Principal | ICD-10-CM

## 2018-05-22 DIAGNOSIS — C778 Secondary and unspecified malignant neoplasm of lymph nodes of multiple regions: Secondary | ICD-10-CM | POA: Diagnosis not present

## 2018-05-22 DIAGNOSIS — K449 Diaphragmatic hernia without obstruction or gangrene: Secondary | ICD-10-CM | POA: Insufficient documentation

## 2018-05-22 DIAGNOSIS — Z933 Colostomy status: Secondary | ICD-10-CM | POA: Insufficient documentation

## 2018-05-22 DIAGNOSIS — D649 Anemia, unspecified: Secondary | ICD-10-CM

## 2018-05-22 DIAGNOSIS — C7802 Secondary malignant neoplasm of left lung: Secondary | ICD-10-CM

## 2018-05-22 DIAGNOSIS — R630 Anorexia: Secondary | ICD-10-CM | POA: Diagnosis not present

## 2018-05-22 DIAGNOSIS — D500 Iron deficiency anemia secondary to blood loss (chronic): Secondary | ICD-10-CM | POA: Diagnosis not present

## 2018-05-22 LAB — CBC WITH DIFFERENTIAL (CANCER CENTER ONLY)
Abs Immature Granulocytes: 0.02 10*3/uL (ref 0.00–0.07)
Basophils Absolute: 0 10*3/uL (ref 0.0–0.1)
Basophils Relative: 1 %
EOS PCT: 3 %
Eosinophils Absolute: 0.1 10*3/uL (ref 0.0–0.5)
HCT: 20.2 % — ABNORMAL LOW (ref 39.0–52.0)
Hemoglobin: 6.1 g/dL — CL (ref 13.0–17.0)
Immature Granulocytes: 0 %
LYMPHS PCT: 15 %
Lymphs Abs: 0.7 10*3/uL (ref 0.7–4.0)
MCH: 28 pg (ref 26.0–34.0)
MCHC: 30.2 g/dL (ref 30.0–36.0)
MCV: 92.7 fL (ref 80.0–100.0)
Monocytes Absolute: 0.5 10*3/uL (ref 0.1–1.0)
Monocytes Relative: 9 %
Neutro Abs: 3.6 10*3/uL (ref 1.7–7.7)
Neutrophils Relative %: 72 %
Platelet Count: 223 10*3/uL (ref 150–400)
RBC: 2.18 MIL/uL — ABNORMAL LOW (ref 4.22–5.81)
RDW: 15.9 % — ABNORMAL HIGH (ref 11.5–15.5)
WBC Count: 5 10*3/uL (ref 4.0–10.5)
nRBC: 0 % (ref 0.0–0.2)

## 2018-05-22 LAB — CMP (CANCER CENTER ONLY)
ALT: 16 U/L (ref 0–44)
AST: 17 U/L (ref 15–41)
Albumin: 2.9 g/dL — ABNORMAL LOW (ref 3.5–5.0)
Alkaline Phosphatase: 110 U/L (ref 38–126)
Anion gap: 7 (ref 5–15)
BUN: 14 mg/dL (ref 8–23)
CO2: 25 mmol/L (ref 22–32)
Calcium: 8.2 mg/dL — ABNORMAL LOW (ref 8.9–10.3)
Chloride: 115 mmol/L — ABNORMAL HIGH (ref 98–111)
Creatinine: 1.48 mg/dL — ABNORMAL HIGH (ref 0.61–1.24)
GFR, Est AFR Am: 55 mL/min — ABNORMAL LOW (ref >60)
GFR, Estimated: 48 mL/min — ABNORMAL LOW (ref >60)
Glucose, Bld: 101 mg/dL — ABNORMAL HIGH (ref 70–99)
Potassium: 3.6 mmol/L (ref 3.5–5.1)
Sodium: 147 mmol/L — ABNORMAL HIGH (ref 135–145)
Total Bilirubin: 0.5 mg/dL (ref 0.3–1.2)
Total Protein: 6 g/dL — ABNORMAL LOW (ref 6.5–8.1)

## 2018-05-22 LAB — PREPARE RBC (CROSSMATCH)

## 2018-05-22 LAB — CEA (IN HOUSE-CHCC): CEA (CHCC-In House): 341.77 ng/mL — ABNORMAL HIGH (ref 0.00–5.00)

## 2018-05-22 LAB — ABO/RH: ABO/RH(D): A POS

## 2018-05-22 MED ORDER — DEXTROSE 5 % IV SOLN
Freq: Once | INTRAVENOUS | Status: DC
  Filled 2018-05-22: qty 250

## 2018-05-22 MED ORDER — PALONOSETRON HCL INJECTION 0.25 MG/5ML
0.2500 mg | Freq: Once | INTRAVENOUS | Status: AC
  Administered 2018-05-22: 0.25 mg via INTRAVENOUS

## 2018-05-22 MED ORDER — SODIUM CHLORIDE 0.9% IV SOLUTION
250.0000 mL | Freq: Once | INTRAVENOUS | Status: AC
  Administered 2018-05-22: 250 mL via INTRAVENOUS
  Filled 2018-05-22: qty 250

## 2018-05-22 MED ORDER — DEXAMETHASONE SODIUM PHOSPHATE 10 MG/ML IJ SOLN
INTRAMUSCULAR | Status: AC
  Filled 2018-05-22: qty 1

## 2018-05-22 MED ORDER — PALONOSETRON HCL INJECTION 0.25 MG/5ML
INTRAVENOUS | Status: AC
  Filled 2018-05-22: qty 5

## 2018-05-22 MED ORDER — SODIUM CHLORIDE 0.9 % IV SOLN
2400.0000 mg/m2 | INTRAVENOUS | Status: DC
  Administered 2018-05-22: 3900 mg via INTRAVENOUS
  Filled 2018-05-22: qty 78

## 2018-05-22 MED ORDER — DEXTROSE 5 % IV SOLN
Freq: Once | INTRAVENOUS | Status: AC
  Administered 2018-05-22: 11:00:00 via INTRAVENOUS
  Filled 2018-05-22: qty 250

## 2018-05-22 MED ORDER — DEXAMETHASONE SODIUM PHOSPHATE 10 MG/ML IJ SOLN
10.0000 mg | Freq: Once | INTRAMUSCULAR | Status: AC
  Administered 2018-05-22: 10 mg via INTRAVENOUS

## 2018-05-22 MED ORDER — LEUCOVORIN CALCIUM INJECTION 350 MG
400.0000 mg/m2 | Freq: Once | INTRAVENOUS | Status: AC
  Administered 2018-05-22: 652 mg via INTRAVENOUS
  Filled 2018-05-22: qty 32.6

## 2018-05-22 MED ORDER — SODIUM CHLORIDE 0.9% FLUSH
10.0000 mL | INTRAVENOUS | Status: DC | PRN
  Administered 2018-05-22: 10 mL
  Filled 2018-05-22: qty 10

## 2018-05-22 MED ORDER — FLUOROURACIL CHEMO INJECTION 2.5 GM/50ML
400.0000 mg/m2 | Freq: Once | INTRAVENOUS | Status: AC
  Administered 2018-05-22: 650 mg via INTRAVENOUS
  Filled 2018-05-22: qty 13

## 2018-05-22 MED ORDER — OXALIPLATIN CHEMO INJECTION 100 MG/20ML
92.0000 mg/m2 | Freq: Once | INTRAVENOUS | Status: AC
  Administered 2018-05-22: 150 mg via INTRAVENOUS
  Filled 2018-05-22: qty 20

## 2018-05-22 NOTE — Telephone Encounter (Signed)
Printed avs and calender of upcoming appointment. Per 2/6 los 

## 2018-05-22 NOTE — Progress Notes (Signed)
Ok to treat with labs today per Lattie Haw T NP - patient will have blood transfusion at the end of the week per her   Patient will come at 11am for one unit of blood on 05/24/2018 and he will have a PIV For that blood, he is aware. His pump will come off at 2pm with NO priming of 11fu at that time, just remove the pump per MD Sherrill.

## 2018-05-22 NOTE — Progress Notes (Addendum)
[place] OFFICE PROGRESS NOTE   Diagnosis: Colon cancer  INTERVAL HISTORY:   Miguel Gamble returns as scheduled.  He completed cycle 1 FOLFOX 05/07/2018.  He denies nausea/vomiting.  No mouth sores.  No diarrhea.  Cold sensitivity lasted a few days.  No persistent neuropathy symptoms.  He denies any bleeding.  No shortness of breath.  Energy level is poor.  He is interested in undergoing a right inguinal hernia repair procedure in the near future.  Objective:  Vital signs in last 24 hours:  Blood pressure 129/81, pulse 85, temperature 98.6 F (37 C), temperature source Oral, resp. rate 18, height 5' 8"  (1.727 m), weight 128 lb 8 oz (58.3 kg), SpO2 100 %.    HEENT: No thrush or ulcers. Resp: Lungs clear bilaterally. Cardio: Regular rate and rhythm. GI: No hepatomegaly.  Left abdomen colostomy. Vascular: Trace edema at the lower legs bilaterally right greater than left. Musculoskeletal: Right inguinal hernia. Port-A-Cath without erythema.  Lab Results:  Lab Results  Component Value Date   WBC 5.0 05/22/2018   HGB 6.1 (LL) 05/22/2018   HCT 20.2 (L) 05/22/2018   MCV 92.7 05/22/2018   PLT 223 05/22/2018   NEUTROABS 3.6 05/22/2018    Imaging:  No results found.  Medications: I have reviewed the patient's current medications.  Assessment/Plan: 1.  Metastatic colon cancer-abdomen/pelvic mass appears to arise from the sigmoid colon  Noncontrast CT 04/14/2018- abdomen/pelvic mass, partial colonic and small bowel obstruction, fistula between the mass and small bowel loops, multiple bladder tumors, left hydronephrosis, bilateral lung metastases, lymphatic tumor spread in the lungs versus pneumonia, precarinal lymph node, retroperitoneal/mesenteric lymphadenopathy  Resection of multiple bladder masses 04/15/2018- adenocarcinoma consistent with a colonic primary, MSS tumor mutation burden 10, KRAS G12D, No BRAF mutation  Cycle 1 FOLFOX 05/07/2018  Cycle 2  FOLFOX 05/22/2018   2.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  Right inguinal hernia.  He will follow-up with Dr. Ninfa Linden.  6.  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  He has completed 1 cycle of FOLFOX.  Overall he tolerated well.  Plan to proceed with cycle 2 today as scheduled.  We reviewed the CBC from today.  Counts are adequate to proceed with chemotherapy.  He has progressive anemia.  We are arranging for a blood transfusion 05/24/2018.  He has a right inguinal hernia and is interested in undergoing repair.  He will follow-up with Dr. Ninfa Linden.  He will return for lab, follow-up and cycle 3 FOLFOX in 2 weeks.  He will contact the office in the interim with any problems.  Patient seen with Dr. Benay Spice.  25 minutes were spent face-to-face at today's visit with the majority that time involved in counseling/coordination of care.  Ned Card ANP/GNP-BC   05/22/2018  10:31 AM  This was a shared visit with Ned Card.  Miguel Gamble was interviewed and examined.  He has completed 1 cycle of FOLFOX.  He tolerated the chemotherapy well.  He will complete cycle 2 beginning today.  He has anemia secondary to chronic disease, renal insufficiency, chemotherapy, and recent surgery.  He will be transfused with packed red blood cells.  We will begin a trial of erythropoietin therapy if the anemia and renal failure persist.  He will be referred to Dr. Ninfa Linden for repair of  the right inguinal hernia.  Julieanne Manson, MD

## 2018-05-22 NOTE — Telephone Encounter (Signed)
err

## 2018-05-22 NOTE — Patient Instructions (Signed)
Blood Transfusion, Adult, Care After  This sheet gives you information about how to care for yourself after your procedure. Your health care provider may also give you more specific instructions. If you have problems or questions, contact your health care provider.  What can I expect after the procedure?  After your procedure, it is common to have:   Bruising and soreness where the IV tube was inserted.   Headache.  Follow these instructions at home:     Take over-the-counter and prescription medicines only as told by your health care provider.   Return to your normal activities as told by your health care provider.   Follow instructions from your health care provider about how to take care of your IV insertion site. Make sure you:  ? Wash your hands with soap and water before you change your bandage (dressing). If soap and water are not available, use hand sanitizer.  ? Change your dressing as told by your health care provider.   Check your IV insertion site every day for signs of infection. Check for:  ? More redness, swelling, or pain.  ? More fluid or blood.  ? Warmth.  ? Pus or a bad smell.  Contact a health care provider if:   You have more redness, swelling, or pain around the IV insertion site.   You have more fluid or blood coming from the IV insertion site.   Your IV insertion site feels warm to the touch.   You have pus or a bad smell coming from the IV insertion site.   Your urine turns pink, red, or brown.   You feel weak after doing your normal activities.  Get help right away if:   You have signs of a serious allergic or immune system reaction, including:  ? Itchiness.  ? Hives.  ? Trouble breathing.  ? Anxiety.  ? Chest or lower back pain.  ? Fever, flushing, and chills.  ? Rapid pulse.  ? Rash.  ? Diarrhea.  ? Vomiting.  ? Dark urine.  ? Serious headache.  ? Dizziness.  ? Stiff neck.  ? Yellow coloration of the face or the white parts of the eyes (jaundice).  This information is not  intended to replace advice given to you by your health care provider. Make sure you discuss any questions you have with your health care provider.  Document Released: 04/23/2014 Document Revised: 11/30/2015 Document Reviewed: 10/17/2015  Elsevier Interactive Patient Education  2019 [employer]

## 2018-05-22 NOTE — Telephone Encounter (Signed)
Report from lab Hemoglobin 6.1. Lisa aware.

## 2018-05-23 ENCOUNTER — Other Ambulatory Visit: Payer: Self-pay | Admitting: Surgery

## 2018-05-24 ENCOUNTER — Inpatient Hospital Stay: Payer: PRIVATE HEALTH INSURANCE

## 2018-05-24 VITALS — BP 132/79 | HR 72 | Temp 98.9°F | Resp 16

## 2018-05-24 DIAGNOSIS — C772 Secondary and unspecified malignant neoplasm of intra-abdominal lymph nodes: Principal | ICD-10-CM

## 2018-05-24 DIAGNOSIS — Z5111 Encounter for antineoplastic chemotherapy: Secondary | ICD-10-CM | POA: Diagnosis not present

## 2018-05-24 DIAGNOSIS — C187 Malignant neoplasm of sigmoid colon: Secondary | ICD-10-CM

## 2018-05-24 DIAGNOSIS — D649 Anemia, unspecified: Secondary | ICD-10-CM

## 2018-05-24 MED ORDER — SODIUM CHLORIDE 0.9% IV SOLUTION
250.0000 mL | Freq: Once | INTRAVENOUS | Status: AC
  Administered 2018-05-24: 250 mL via INTRAVENOUS
  Filled 2018-05-24: qty 250

## 2018-05-24 MED ORDER — SODIUM CHLORIDE 0.9% FLUSH
10.0000 mL | INTRAVENOUS | Status: DC | PRN
  Administered 2018-05-24: 10 mL
  Filled 2018-05-24: qty 10

## 2018-05-24 MED ORDER — HEPARIN SOD (PORK) LOCK FLUSH 100 UNIT/ML IV SOLN
500.0000 [IU] | Freq: Once | INTRAVENOUS | Status: AC | PRN
  Administered 2018-05-24: 500 [IU]
  Filled 2018-05-24: qty 5

## 2018-05-24 NOTE — Patient Instructions (Signed)
Blood Transfusion, Adult, Care After This sheet gives you information about how to care for yourself after your procedure. Your doctor may also give you more specific instructions. If you have problems or questions, contact your doctor. Follow these instructions at home:   Take over-the-counter and prescription medicines only as told by your doctor.  Go back to your normal activities as told by your doctor.  Follow instructions from your doctor about how to take care of the area where an IV tube was put into your vein (insertion site). Make sure you: ? Wash your hands with soap and water before you change your bandage (dressing). If there is no soap and water, use hand sanitizer. ? Change your bandage as told by your doctor.  Check your IV insertion site every day for signs of infection. Check for: ? More redness, swelling, or pain. ? More fluid or blood. ? Warmth. ? Pus or a bad smell. Contact a doctor if:  You have more redness, swelling, or pain around the IV insertion site.  You have more fluid or blood coming from the IV insertion site.  Your IV insertion site feels warm to the touch.  You have pus or a bad smell coming from the IV insertion site.  Your pee (urine) turns pink, red, or brown.  You feel weak after doing your normal activities. Get help right away if:  You have signs of a serious allergic or body defense (immune) system reaction, including: ? Itchiness. ? Hives. ? Trouble breathing. ? Anxiety. ? Pain in your chest or lower back. ? Fever, flushing, and chills. ? Fast pulse. ? Rash. ? Watery poop (diarrhea). ? Throwing up (vomiting). ? Dark pee. ? Serious headache. ? Dizziness. ? Stiff neck. ? Yellow color in your face or the white parts of your eyes (jaundice). Summary  After a blood transfusion, return to your normal activities as told by your doctor.  Every day, check for signs of infection where the IV tube was put into your vein.  Some  signs of infection are warm skin, more redness and pain, more fluid or blood, and pus or a bad smell where the needle went in.  Contact your doctor if you feel weak or have any unusual symptoms. This information is not intended to replace advice given to you by your health care provider. Make sure you discuss any questions you have with your health care provider. Document Released: 04/23/2014 Document Revised: 11/25/2015 Document Reviewed: 11/25/2015 Elsevier Interactive Patient Education  2019 [employer]vier Inc.  

## 2018-05-25 LAB — TYPE AND SCREEN
ABO/RH(D): A POS
ANTIBODY SCREEN: NEGATIVE
Unit division: 0
Unit division: 0

## 2018-05-25 LAB — BPAM RBC
Blood Product Expiration Date: 202002262359
Blood Product Expiration Date: 202003022359
ISSUE DATE / TIME: 202002061438
ISSUE DATE / TIME: 202002081122
Unit Type and Rh: 6200
Unit Type and Rh: 6200

## 2018-06-01 ENCOUNTER — Other Ambulatory Visit: Payer: Self-pay | Admitting: Oncology

## 2018-06-05 ENCOUNTER — Inpatient Hospital Stay (HOSPITAL_BASED_OUTPATIENT_CLINIC_OR_DEPARTMENT_OTHER): Payer: PRIVATE HEALTH INSURANCE | Admitting: Nurse Practitioner

## 2018-06-05 ENCOUNTER — Inpatient Hospital Stay: Payer: PRIVATE HEALTH INSURANCE

## 2018-06-05 ENCOUNTER — Telehealth: Payer: Self-pay | Admitting: Nurse Practitioner

## 2018-06-05 ENCOUNTER — Encounter: Payer: Self-pay | Admitting: Nurse Practitioner

## 2018-06-05 VITALS — BP 133/79 | HR 88 | Temp 98.7°F | Resp 18 | Ht 68.0 in | Wt 126.3 lb

## 2018-06-05 DIAGNOSIS — C772 Secondary and unspecified malignant neoplasm of intra-abdominal lymph nodes: Principal | ICD-10-CM

## 2018-06-05 DIAGNOSIS — C187 Malignant neoplasm of sigmoid colon: Secondary | ICD-10-CM | POA: Diagnosis not present

## 2018-06-05 DIAGNOSIS — C7802 Secondary malignant neoplasm of left lung: Secondary | ICD-10-CM

## 2018-06-05 DIAGNOSIS — D630 Anemia in neoplastic disease: Secondary | ICD-10-CM

## 2018-06-05 DIAGNOSIS — Z5111 Encounter for antineoplastic chemotherapy: Secondary | ICD-10-CM

## 2018-06-05 DIAGNOSIS — Z933 Colostomy status: Secondary | ICD-10-CM

## 2018-06-05 DIAGNOSIS — C778 Secondary and unspecified malignant neoplasm of lymph nodes of multiple regions: Secondary | ICD-10-CM

## 2018-06-05 DIAGNOSIS — K449 Diaphragmatic hernia without obstruction or gangrene: Secondary | ICD-10-CM

## 2018-06-05 DIAGNOSIS — Z95828 Presence of other vascular implants and grafts: Secondary | ICD-10-CM

## 2018-06-05 DIAGNOSIS — C7801 Secondary malignant neoplasm of right lung: Secondary | ICD-10-CM

## 2018-06-05 DIAGNOSIS — R630 Anorexia: Secondary | ICD-10-CM

## 2018-06-05 DIAGNOSIS — R634 Abnormal weight loss: Secondary | ICD-10-CM

## 2018-06-05 DIAGNOSIS — D500 Iron deficiency anemia secondary to blood loss (chronic): Secondary | ICD-10-CM

## 2018-06-05 LAB — CMP (CANCER CENTER ONLY)
ALT: 10 U/L (ref 0–44)
AST: 17 U/L (ref 15–41)
Albumin: 3.3 g/dL — ABNORMAL LOW (ref 3.5–5.0)
Alkaline Phosphatase: 91 U/L (ref 38–126)
Anion gap: 8 (ref 5–15)
BUN: 23 mg/dL (ref 8–23)
CO2: 26 mmol/L (ref 22–32)
Calcium: 8.6 mg/dL — ABNORMAL LOW (ref 8.9–10.3)
Chloride: 111 mmol/L (ref 98–111)
Creatinine: 1.97 mg/dL — ABNORMAL HIGH (ref 0.61–1.24)
GFR, Est AFR Am: 39 mL/min — ABNORMAL LOW (ref >60)
GFR, Estimated: 34 mL/min — ABNORMAL LOW (ref >60)
Glucose, Bld: 105 mg/dL — ABNORMAL HIGH (ref 70–99)
Potassium: 4 mmol/L (ref 3.5–5.1)
Sodium: 145 mmol/L (ref 135–145)
Total Bilirubin: 0.4 mg/dL (ref 0.3–1.2)
Total Protein: 6.5 g/dL (ref 6.5–8.1)

## 2018-06-05 LAB — CBC WITH DIFFERENTIAL (CANCER CENTER ONLY)
Abs Immature Granulocytes: 0.01 10*3/uL (ref 0.00–0.07)
BASOS PCT: 1 %
Basophils Absolute: 0 10*3/uL (ref 0.0–0.1)
Eosinophils Absolute: 0.1 10*3/uL (ref 0.0–0.5)
Eosinophils Relative: 2 %
HCT: 28.2 % — ABNORMAL LOW (ref 39.0–52.0)
Hemoglobin: 8.7 g/dL — ABNORMAL LOW (ref 13.0–17.0)
Immature Granulocytes: 0 %
Lymphocytes Relative: 15 %
Lymphs Abs: 0.9 10*3/uL (ref 0.7–4.0)
MCH: 28.5 pg (ref 26.0–34.0)
MCHC: 30.9 g/dL (ref 30.0–36.0)
MCV: 92.5 fL (ref 80.0–100.0)
Monocytes Absolute: 0.7 10*3/uL (ref 0.1–1.0)
Monocytes Relative: 11 %
Neutro Abs: 4.3 10*3/uL (ref 1.7–7.7)
Neutrophils Relative %: 71 %
Platelet Count: 171 10*3/uL (ref 150–400)
RBC: 3.05 MIL/uL — ABNORMAL LOW (ref 4.22–5.81)
RDW: 15.3 % (ref 11.5–15.5)
WBC Count: 6.1 10*3/uL (ref 4.0–10.5)
nRBC: 0 % (ref 0.0–0.2)

## 2018-06-05 LAB — CEA (IN HOUSE-CHCC): CEA (CHCC-In House): 303.67 ng/mL — ABNORMAL HIGH (ref 0.00–5.00)

## 2018-06-05 MED ORDER — DEXAMETHASONE SODIUM PHOSPHATE 10 MG/ML IJ SOLN
INTRAMUSCULAR | Status: AC
  Filled 2018-06-05: qty 1

## 2018-06-05 MED ORDER — SODIUM CHLORIDE 0.9 % IV SOLN
INTRAVENOUS | Status: DC
  Administered 2018-06-05: 12:00:00 via INTRAVENOUS
  Filled 2018-06-05 (×2): qty 250

## 2018-06-05 MED ORDER — LEUCOVORIN CALCIUM INJECTION 350 MG
400.0000 mg/m2 | Freq: Once | INTRAVENOUS | Status: AC
  Administered 2018-06-05: 652 mg via INTRAVENOUS
  Filled 2018-06-05: qty 32.6

## 2018-06-05 MED ORDER — FLUOROURACIL CHEMO INJECTION 2.5 GM/50ML
400.0000 mg/m2 | Freq: Once | INTRAVENOUS | Status: AC
  Administered 2018-06-05: 650 mg via INTRAVENOUS
  Filled 2018-06-05: qty 13

## 2018-06-05 MED ORDER — DEXTROSE 5 % IV SOLN
Freq: Once | INTRAVENOUS | Status: AC
  Administered 2018-06-05: 12:00:00 via INTRAVENOUS
  Filled 2018-06-05: qty 250

## 2018-06-05 MED ORDER — OXALIPLATIN CHEMO INJECTION 100 MG/20ML
70.0000 mg/m2 | Freq: Once | INTRAVENOUS | Status: AC
  Administered 2018-06-05: 115 mg via INTRAVENOUS
  Filled 2018-06-05: qty 20

## 2018-06-05 MED ORDER — PALONOSETRON HCL INJECTION 0.25 MG/5ML
0.2500 mg | Freq: Once | INTRAVENOUS | Status: AC
  Administered 2018-06-05: 0.25 mg via INTRAVENOUS

## 2018-06-05 MED ORDER — SODIUM CHLORIDE 0.9 % IV SOLN
2400.0000 mg/m2 | INTRAVENOUS | Status: DC
  Administered 2018-06-05: 3900 mg via INTRAVENOUS
  Filled 2018-06-05: qty 78

## 2018-06-05 MED ORDER — SODIUM CHLORIDE 0.9% FLUSH
10.0000 mL | INTRAVENOUS | Status: DC | PRN
  Administered 2018-06-05: 10 mL
  Filled 2018-06-05: qty 10

## 2018-06-05 MED ORDER — DEXAMETHASONE SODIUM PHOSPHATE 10 MG/ML IJ SOLN
10.0000 mg | Freq: Once | INTRAMUSCULAR | Status: AC
  Administered 2018-06-05: 10 mg via INTRAVENOUS

## 2018-06-05 MED ORDER — PALONOSETRON HCL INJECTION 0.25 MG/5ML
INTRAVENOUS | Status: AC
  Filled 2018-06-05: qty 5

## 2018-06-05 NOTE — Progress Notes (Signed)
  [place] OFFICE PROGRESS NOTE   Diagnosis: Colon cancer  INTERVAL HISTORY:   Miguel Gamble returns as scheduled.  He completed cycle 2 FOLFOX 05/22/2018.  He denies nausea/vomiting.  No mouth sores.  No diarrhea.  He denies any bleeding.  He noted no significant change in his overall condition following the blood transfusion.  He feels he is taking in adequate fluids.  Objective:  Vital signs in last 24 hours:  Blood pressure 133/79, pulse 88, temperature 98.7 F (37.1 C), temperature source Oral, resp. rate 18, height '5\' 8"'$  (1.727 m), weight 126 lb 4.8 oz (57.3 kg), SpO2 100 %.    HEENT: Mild white coating over tongue.  No buccal thrush.  No ulcers. Resp: Lungs clear bilaterally. Cardio: Regular rate and rhythm. GI: Abdomen soft and nontender.  No hepatomegaly.  Left lower quadrant colostomy with soft stool in the collection bag. Vascular: No leg edema. Neuro: Vibratory sense intact over the fingertips per tuning fork exam. Skin: Palms without erythema. Port-A-Cath without erythema.   Lab Results:  Lab Results  Component Value Date   WBC 6.1 06/05/2018   HGB 8.7 (L) 06/05/2018   HCT 28.2 (L) 06/05/2018   MCV 92.5 06/05/2018   PLT 171 06/05/2018   NEUTROABS 4.3 06/05/2018    Imaging:  No results found.  Medications: I have reviewed the patient's current medications.  Assessment/Plan: 1.Metastatic colon cancer-abdomen/pelvic mass appears to arise from the sigmoid colon  Noncontrast CT 04/14/2018- abdomen/pelvic mass, partial colonic and small bowel obstruction, fistula between the mass and small bowel loops, multiple bladder tumors, left hydronephrosis, bilateral lung metastases, lymphatic tumor spread in the lungs versus pneumonia, precarinal lymph node, retroperitoneal/mesenteric lymphadenopathy  Resection of multiple bladder masses 04/15/2018-adenocarcinoma consistent with a colonic primary, MSS tumor mutation burden 10, KRAS G12D, No BRAF  mutation  Cycle 1 FOLFOX 05/07/2018  Cycle 2 FOLFOX 05/22/2018  Cycle 3 FOLFOX 06/05/2018 (oxaliplatin dose reduced due to increased creatinine)   2.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  Right inguinal hernia.  He will follow-up with Dr. Ninfa Linden.  6.  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  He has completed 2 cycles of FOLFOX.  He continues to tolerate the chemotherapy well.  Plan to proceed with cycle 3 today as scheduled.  I reviewed today's labs with Dr. Burr Medico.  The creatinine is higher as compared to 2 weeks ago.  The oxaliplatin will be dose reduced.  He will receive additional IV fluids today and on the day of pump discontinuation.  He will return for a follow-up basic metabolic panel on 8/65/7846.  I will discuss daily fluid intake recommendations for him with the [facility] dietitian.  He will return for lab, follow-up and cycle 4 FOLFOX in 2 weeks.  He will contact the office in the interim with any problems.  Plan reviewed with Dr. Burr Medico.  25 minutes were spent face-to-face at today's visit with the majority of that time involved in counseling/coordination of care.    Ned Card ANP/GNP-BC   06/05/2018  9:46 AM

## 2018-06-05 NOTE — Telephone Encounter (Signed)
Scheduled appt per 2/20 los. ° °Printed calendar and avs. °

## 2018-06-05 NOTE — Progress Notes (Signed)
Per Ned Card NP ok to tx today with labs, Oxaliplatin to be dose reduced and hydration fluids added to orders.

## 2018-06-05 NOTE — Patient Instructions (Signed)
[facility] Discharge Instructions for Patients Receiving Chemotherapy  Today you received the following chemotherapy agents: Oxaliplatin, Leucovorin, and 5-FU.  To help prevent nausea and vomiting after your treatment, we encourage you to take your nausea medication as prescribed.  If you develop nausea and vomiting that is not controlled by your nausea medication, call the clinic.   BELOW ARE SYMPTOMS THAT SHOULD BE REPORTED IMMEDIATELY:  *FEVER GREATER THAN 100.5 F  *CHILLS WITH OR WITHOUT FEVER  NAUSEA AND VOMITING THAT IS NOT CONTROLLED WITH YOUR NAUSEA MEDICATION  *UNUSUAL SHORTNESS OF BREATH  *UNUSUAL BRUISING OR BLEEDING  TENDERNESS IN MOUTH AND THROAT WITH OR WITHOUT PRESENCE OF ULCERS  *URINARY PROBLEMS  *BOWEL PROBLEMS  UNUSUAL RASH Items with * indicate a potential emergency and should be followed up as soon as possible.  Feel free to call the clinic should you have any questions or concerns. The clinic phone number is [phone].  Please show the [facility] at check-in to the Emergency Department and triage nurse.

## 2018-06-06 ENCOUNTER — Other Ambulatory Visit: Payer: Self-pay | Admitting: *Deleted

## 2018-06-06 ENCOUNTER — Other Ambulatory Visit: Payer: Self-pay

## 2018-06-06 DIAGNOSIS — C772 Secondary and unspecified malignant neoplasm of intra-abdominal lymph nodes: Principal | ICD-10-CM

## 2018-06-06 DIAGNOSIS — C187 Malignant neoplasm of sigmoid colon: Secondary | ICD-10-CM

## 2018-06-06 LAB — ERYTHROPOIETIN: Erythropoietin: 36.4 m[IU]/mL — ABNORMAL HIGH (ref 2.6–18.5)

## 2018-06-07 ENCOUNTER — Inpatient Hospital Stay: Payer: PRIVATE HEALTH INSURANCE

## 2018-06-07 DIAGNOSIS — Z5111 Encounter for antineoplastic chemotherapy: Secondary | ICD-10-CM | POA: Diagnosis not present

## 2018-06-07 DIAGNOSIS — C187 Malignant neoplasm of sigmoid colon: Secondary | ICD-10-CM

## 2018-06-07 DIAGNOSIS — C772 Secondary and unspecified malignant neoplasm of intra-abdominal lymph nodes: Principal | ICD-10-CM

## 2018-06-07 MED ORDER — SODIUM CHLORIDE 0.9 % IV SOLN
INTRAVENOUS | Status: AC
  Administered 2018-06-07: 11:00:00 via INTRAVENOUS
  Filled 2018-06-07 (×2): qty 250

## 2018-06-11 ENCOUNTER — Inpatient Hospital Stay: Payer: PRIVATE HEALTH INSURANCE

## 2018-06-11 DIAGNOSIS — C187 Malignant neoplasm of sigmoid colon: Secondary | ICD-10-CM

## 2018-06-11 DIAGNOSIS — Z5111 Encounter for antineoplastic chemotherapy: Secondary | ICD-10-CM | POA: Diagnosis not present

## 2018-06-11 DIAGNOSIS — C772 Secondary and unspecified malignant neoplasm of intra-abdominal lymph nodes: Principal | ICD-10-CM

## 2018-06-11 LAB — BASIC METABOLIC PANEL - CANCER CENTER ONLY
Anion gap: 9 (ref 5–15)
BUN: 20 mg/dL (ref 8–23)
CO2: 26 mmol/L (ref 22–32)
Calcium: 8.4 mg/dL — ABNORMAL LOW (ref 8.9–10.3)
Chloride: 110 mmol/L (ref 98–111)
Creatinine: 1.58 mg/dL — ABNORMAL HIGH (ref 0.61–1.24)
GFR, EST NON AFRICAN AMERICAN: 44 mL/min — AB (ref >60)
GFR, Est AFR Am: 51 mL/min — ABNORMAL LOW (ref >60)
Glucose, Bld: 90 mg/dL (ref 70–99)
POTASSIUM: 3.5 mmol/L (ref 3.5–5.1)
Sodium: 145 mmol/L (ref 135–145)

## 2018-06-13 ENCOUNTER — Telehealth: Payer: Self-pay

## 2018-06-13 NOTE — Pre-Procedure Instructions (Signed)
[facility]  06/13/2018      [id number] [address], [address] AT [facility] OF [address] [place] [address] Phone: [phone] Fax: [phone]    Your procedure is scheduled on March 9  Report to [facility] at [place].M.  Call this number if you have problems the morning of surgery:  [phone]   Remember:  Do not eat after midnight.  You may drink clear liquids until 0430 am .  Clear liquids allowed are:     Water, Juice (non-citric and without pulp), Carbonated beverages, Clear Tea, Black Coffee only and Gatorade    There are NO medications that you need to take the morning of surgery   7 days prior to surgery STOP taking any Aspirin (unless otherwise instructed by your surgeon), Aleve, Naproxen, Ibuprofen, Motrin, Advil, Goody's, BC's, all herbal medications, fish oil, and all vitamins.   Do not wear jewelry  Do not wear lotions, powders, or cologne, or deodorant.   Men may shave face and neck.  Do not bring valuables to the hospital.  [facility] is not responsible for any belongings or valuables.  Contacts, dentures or bridgework may not be worn into surgery.  Leave your suitcase in the car.  After surgery it may be brought to your room.  For patients admitted to the hospital, discharge time will be determined by your treatment team.  Patients discharged the day of surgery will not be allowed to drive home.    Special instructions:   [facility]- Preparing For Surgery  Before surgery, you can play an important role. Because skin is not sterile, your skin needs to be as free of germs as possible. You can reduce the number of germs on your skin by washing with CHG (chlorahexidine gluconate) Soap before surgery.  CHG is an antiseptic cleaner which kills germs and bonds with the skin to continue killing germs even after washing.    Oral Hygiene is also important to reduce your risk of  infection.  Remember - BRUSH YOUR TEETH THE MORNING OF SURGERY WITH YOUR REGULAR TOOTHPASTE  Please do not use if you have an allergy to CHG or antibacterial soaps. If your skin becomes reddened/irritated stop using the CHG.  Do not shave (including legs and underarms) for at least 48 hours prior to first CHG shower. It is OK to shave your face.  Please follow these instructions carefully.   1. Shower the NIGHT BEFORE SURGERY and the MORNING OF SURGERY with CHG.   2. If you chose to wash your hair, wash your hair first as usual with your normal shampoo.  3. After you shampoo, rinse your hair and body thoroughly to remove the shampoo.  4. Use CHG as you would any other liquid soap. You can apply CHG directly to the skin and wash gently with a scrungie or a clean washcloth.   5. Apply the CHG Soap to your body ONLY FROM THE NECK DOWN.  Do not use on open wounds or open sores. Avoid contact with your eyes, ears, mouth and genitals (private parts). Wash Face and genitals (private parts)  with your normal soap.  6. Wash thoroughly, paying special attention to the area where your surgery will be performed.  7. Thoroughly rinse your body with warm water from the neck down.  8. DO NOT shower/wash with your normal soap after using and rinsing off the CHG  Soap.  9. Pat yourself dry with a CLEAN TOWEL.  10. Wear CLEAN PAJAMAS to bed the night before surgery, wear comfortable clothes the morning of surgery  11. Place CLEAN SHEETS on your bed the night of your first shower and DO NOT SLEEP WITH PETS.    Day of Surgery:  Do not apply any deodorants/lotions.  Please wear clean clothes to the hospital/surgery center.   Remember to brush your teeth WITH YOUR REGULAR TOOTHPASTE.    Please read over the following fact sheets that you were given.

## 2018-06-13 NOTE — Telephone Encounter (Signed)
TC to pt per Lattie Haw to let him knowkidney function is better, back to baseline. Follow-up as scheduled. Pt verbalized understanding. No further problems or concerns at this time.

## 2018-06-15 ENCOUNTER — Other Ambulatory Visit: Payer: Self-pay | Admitting: Oncology

## 2018-06-16 ENCOUNTER — Encounter (HOSPITAL_COMMUNITY)
Admission: RE | Admit: 2018-06-16 | Discharge: 2018-06-16 | Disposition: A | Discharge status: Home / Self Care | Payer: PRIVATE HEALTH INSURANCE | Source: Ambulatory Visit | Attending: Orthopaedic Surgery | Admitting: Orthopaedic Surgery

## 2018-06-16 ENCOUNTER — Other Ambulatory Visit: Payer: Self-pay

## 2018-06-16 ENCOUNTER — Encounter (HOSPITAL_COMMUNITY): Payer: Self-pay

## 2018-06-16 DIAGNOSIS — Z01812 Encounter for preprocedural laboratory examination: Secondary | ICD-10-CM | POA: Diagnosis present

## 2018-06-16 HISTORY — DX: Malignant (primary) neoplasm, unspecified: C80.1

## 2018-06-16 HISTORY — DX: Anemia, unspecified: D64.9

## 2018-06-16 NOTE — Progress Notes (Signed)
PCP - Dr. Wenda Low Cardiologist - denies  Chest x-ray - N/A EKG - N/A Stress Test - denies ECHO - denies Cardiac Cath - denies  Sleep Study - denies  Aspirin Instructions: Patient instructed to hold all Aspirin, NSAID's, herbal medications, fish oil and vitamins 7 days prior to surgery.   Anesthesia review:   Patient denies shortness of breath, fever, cough and chest pain at PAT appointment   Patient verbalized understanding of instructions that were given to them at the PAT appointment. Patient was also instructed that they will need to review over the PAT instructions again at home before surgery.   * labs not drawn at PAT visit, patient has oncology appointment scheduled for 06/18/18 with scheduled CBC and BMP

## 2018-06-18 ENCOUNTER — Other Ambulatory Visit: Payer: Self-pay

## 2018-06-18 ENCOUNTER — Inpatient Hospital Stay: Payer: PRIVATE HEALTH INSURANCE

## 2018-06-18 ENCOUNTER — Inpatient Hospital Stay: Payer: PRIVATE HEALTH INSURANCE | Attending: Nurse Practitioner | Admitting: Oncology

## 2018-06-18 VITALS — BP 143/89 | HR 67 | Temp 98.0°F | Resp 19 | Ht 68.0 in | Wt 131.6 lb

## 2018-06-18 DIAGNOSIS — D631 Anemia in chronic kidney disease: Secondary | ICD-10-CM | POA: Insufficient documentation

## 2018-06-18 DIAGNOSIS — C7801 Secondary malignant neoplasm of right lung: Secondary | ICD-10-CM | POA: Diagnosis not present

## 2018-06-18 DIAGNOSIS — D500 Iron deficiency anemia secondary to blood loss (chronic): Secondary | ICD-10-CM | POA: Insufficient documentation

## 2018-06-18 DIAGNOSIS — C772 Secondary and unspecified malignant neoplasm of intra-abdominal lymph nodes: Secondary | ICD-10-CM | POA: Insufficient documentation

## 2018-06-18 DIAGNOSIS — C187 Malignant neoplasm of sigmoid colon: Secondary | ICD-10-CM | POA: Insufficient documentation

## 2018-06-18 DIAGNOSIS — R634 Abnormal weight loss: Secondary | ICD-10-CM | POA: Diagnosis not present

## 2018-06-18 DIAGNOSIS — C7802 Secondary malignant neoplasm of left lung: Secondary | ICD-10-CM | POA: Insufficient documentation

## 2018-06-18 DIAGNOSIS — Z5111 Encounter for antineoplastic chemotherapy: Secondary | ICD-10-CM | POA: Diagnosis present

## 2018-06-18 DIAGNOSIS — K4090 Unilateral inguinal hernia, without obstruction or gangrene, not specified as recurrent: Secondary | ICD-10-CM | POA: Diagnosis not present

## 2018-06-18 DIAGNOSIS — Z933 Colostomy status: Secondary | ICD-10-CM | POA: Diagnosis not present

## 2018-06-18 DIAGNOSIS — R591 Generalized enlarged lymph nodes: Secondary | ICD-10-CM | POA: Insufficient documentation

## 2018-06-18 DIAGNOSIS — Z95828 Presence of other vascular implants and grafts: Secondary | ICD-10-CM

## 2018-06-18 DIAGNOSIS — R1900 Intra-abdominal and pelvic swelling, mass and lump, unspecified site: Secondary | ICD-10-CM | POA: Insufficient documentation

## 2018-06-18 DIAGNOSIS — N179 Acute kidney failure, unspecified: Secondary | ICD-10-CM | POA: Insufficient documentation

## 2018-06-18 DIAGNOSIS — N1330 Unspecified hydronephrosis: Secondary | ICD-10-CM | POA: Insufficient documentation

## 2018-06-18 DIAGNOSIS — K56609 Unspecified intestinal obstruction, unspecified as to partial versus complete obstruction: Secondary | ICD-10-CM | POA: Diagnosis not present

## 2018-06-18 DIAGNOSIS — D6481 Anemia due to antineoplastic chemotherapy: Secondary | ICD-10-CM | POA: Insufficient documentation

## 2018-06-18 DIAGNOSIS — R891 Abnormal level of hormones in specimens from other organs, systems and tissues: Secondary | ICD-10-CM

## 2018-06-18 DIAGNOSIS — R630 Anorexia: Secondary | ICD-10-CM | POA: Diagnosis not present

## 2018-06-18 LAB — CBC WITH DIFFERENTIAL (CANCER CENTER ONLY)
Abs Immature Granulocytes: 0.02 10*3/uL (ref 0.00–0.07)
BASOS PCT: 1 %
Basophils Absolute: 0 10*3/uL (ref 0.0–0.1)
Eosinophils Absolute: 0.1 10*3/uL (ref 0.0–0.5)
Eosinophils Relative: 3 %
HCT: 26.5 % — ABNORMAL LOW (ref 39.0–52.0)
Hemoglobin: 8.2 g/dL — ABNORMAL LOW (ref 13.0–17.0)
Immature Granulocytes: 0 %
Lymphocytes Relative: 18 %
Lymphs Abs: 0.9 10*3/uL (ref 0.7–4.0)
MCH: 28 pg (ref 26.0–34.0)
MCHC: 30.9 g/dL (ref 30.0–36.0)
MCV: 90.4 fL (ref 80.0–100.0)
Monocytes Absolute: 0.5 10*3/uL (ref 0.1–1.0)
Monocytes Relative: 9 %
Neutro Abs: 3.7 10*3/uL (ref 1.7–7.7)
Neutrophils Relative %: 69 %
PLATELETS: 159 10*3/uL (ref 150–400)
RBC: 2.93 MIL/uL — AB (ref 4.22–5.81)
RDW: 15.6 % — AB (ref 11.5–15.5)
WBC Count: 5.4 10*3/uL (ref 4.0–10.5)
nRBC: 0 % (ref 0.0–0.2)

## 2018-06-18 LAB — CMP (CANCER CENTER ONLY)
ALT: 8 U/L (ref 0–44)
AST: 15 U/L (ref 15–41)
Albumin: 3.3 g/dL — ABNORMAL LOW (ref 3.5–5.0)
Alkaline Phosphatase: 85 U/L (ref 38–126)
Anion gap: 9 (ref 5–15)
BUN: 16 mg/dL (ref 8–23)
CHLORIDE: 111 mmol/L (ref 98–111)
CO2: 24 mmol/L (ref 22–32)
CREATININE: 1.54 mg/dL — AB (ref 0.61–1.24)
Calcium: 8.5 mg/dL — ABNORMAL LOW (ref 8.9–10.3)
GFR, Est AFR Am: 53 mL/min — ABNORMAL LOW (ref >60)
GFR, Estimated: 45 mL/min — ABNORMAL LOW (ref >60)
Glucose, Bld: 90 mg/dL (ref 70–99)
Potassium: 3.5 mmol/L (ref 3.5–5.1)
Sodium: 144 mmol/L (ref 135–145)
Total Bilirubin: 0.4 mg/dL (ref 0.3–1.2)
Total Protein: 6.5 g/dL (ref 6.5–8.1)

## 2018-06-18 LAB — CEA (IN HOUSE-CHCC): CEA (CHCC-In House): 214.5 ng/mL — ABNORMAL HIGH (ref 0.00–5.00)

## 2018-06-18 MED ORDER — LEUCOVORIN CALCIUM INJECTION 350 MG
400.0000 mg/m2 | Freq: Once | INTRAVENOUS | Status: AC
  Administered 2018-06-18: 652 mg via INTRAVENOUS
  Filled 2018-06-18: qty 32.6

## 2018-06-18 MED ORDER — PALONOSETRON HCL INJECTION 0.25 MG/5ML
0.2500 mg | Freq: Once | INTRAVENOUS | Status: AC
  Administered 2018-06-18: 0.25 mg via INTRAVENOUS

## 2018-06-18 MED ORDER — FLUOROURACIL CHEMO INJECTION 2.5 GM/50ML
400.0000 mg/m2 | Freq: Once | INTRAVENOUS | Status: AC
  Administered 2018-06-18: 650 mg via INTRAVENOUS
  Filled 2018-06-18: qty 13

## 2018-06-18 MED ORDER — DEXTROSE 5 % IV SOLN
Freq: Once | INTRAVENOUS | Status: AC
  Administered 2018-06-18: 12:00:00 via INTRAVENOUS
  Filled 2018-06-18: qty 250

## 2018-06-18 MED ORDER — DEXAMETHASONE SODIUM PHOSPHATE 10 MG/ML IJ SOLN
INTRAMUSCULAR | Status: AC
  Filled 2018-06-18: qty 1

## 2018-06-18 MED ORDER — SODIUM CHLORIDE 0.9 % IV SOLN
2400.0000 mg/m2 | INTRAVENOUS | Status: DC
  Administered 2018-06-18: 3900 mg via INTRAVENOUS
  Filled 2018-06-18: qty 78

## 2018-06-18 MED ORDER — PALONOSETRON HCL INJECTION 0.25 MG/5ML
INTRAVENOUS | Status: AC
  Filled 2018-06-18: qty 5

## 2018-06-18 MED ORDER — DEXAMETHASONE SODIUM PHOSPHATE 10 MG/ML IJ SOLN
10.0000 mg | Freq: Once | INTRAMUSCULAR | Status: AC
  Administered 2018-06-18: 10 mg via INTRAVENOUS

## 2018-06-18 MED ORDER — SODIUM CHLORIDE 0.9% FLUSH
10.0000 mL | INTRAVENOUS | Status: DC | PRN
  Administered 2018-06-18: 10 mL
  Filled 2018-06-18: qty 10

## 2018-06-18 MED ORDER — OXALIPLATIN CHEMO INJECTION 100 MG/20ML
70.0000 mg/m2 | Freq: Once | INTRAVENOUS | Status: AC
  Administered 2018-06-18: 115 mg via INTRAVENOUS
  Filled 2018-06-18: qty 10

## 2018-06-18 NOTE — Progress Notes (Signed)
[place] OFFICE PROGRESS NOTE   Diagnosis: Colon cancer  INTERVAL HISTORY:   Miguel Gamble completed another cycle of FOLFOX on 06/05/2018.  No nausea/vomiting, mouth sores, or diarrhea.  Cold sensitivity lasted 4 days.  No neuropathy symptoms at present.  The colostomy is functioning well.  Good appetite.  Objective:  Vital signs in last 24 hours:  Blood pressure (!) 143/89, pulse 67, temperature 98 F (36.7 C), temperature source Oral, resp. rate 19, height 5' 8"  (1.727 m), weight 131 lb 9.6 oz (59.7 kg), SpO2 100 %.    HEENT: No thrush or ulcers Resp: Lungs clear bilaterally Cardio: Regular rate and rhythm GI: No hepatomegaly, left lower quadrant colostomy with brown stool, nontender Vascular: No leg edema  Skin: Mild hyperpigmentation of the hands, diffuse dryness of the skin with flaking  Portacath/PICC-without erythema  Lab Results:  Lab Results  Component Value Date   WBC 5.4 06/18/2018   HGB 8.2 (L) 06/18/2018   HCT 26.5 (L) 06/18/2018   MCV 90.4 06/18/2018   PLT 159 06/18/2018   NEUTROABS 3.7 06/18/2018    CMP  Lab Results  Component Value Date   NA 145 06/11/2018   K 3.5 06/11/2018   CL 110 06/11/2018   CO2 26 06/11/2018   GLUCOSE 90 06/11/2018   BUN 20 06/11/2018   CREATININE 1.58 (H) 06/11/2018   CALCIUM 8.4 (L) 06/11/2018   PROT 6.5 06/05/2018   ALBUMIN 3.3 (L) 06/05/2018   AST 17 06/05/2018   ALT 10 06/05/2018   ALKPHOS 91 06/05/2018   BILITOT 0.4 06/05/2018   GFRNONAA 44 (L) 06/11/2018   GFRAA 51 (L) 06/11/2018    Lab Results  Component Value Date   CEA1 303.67 (H) 06/05/2018     Medications: I have reviewed the patient's current medications.   Assessment/Plan:  1.Metastatic colon cancer-abdomen/pelvic mass appears to arise from the sigmoid colon  Noncontrast CT 04/14/2018- abdomen/pelvic mass, partial colonic and small bowel obstruction, fistula between the mass and small bowel loops, multiple bladder tumors,  left hydronephrosis, bilateral lung metastases, lymphatic tumor spread in the lungs versus pneumonia, precarinal lymph node, retroperitoneal/mesenteric lymphadenopathy  Resection of multiple bladder masses 04/15/2018-adenocarcinoma consistent with a colonic primary, MSS, tumor mutation burden 10, KRAS G12D, No BRAF mutation  Cycle 1 FOLFOX 05/07/2018  Cycle 2 FOLFOX 05/22/2018  Cycle 3 FOLFOX 06/05/2018 (oxaliplatin dose reduced due to increased creatinine)  Cycle 4 FOLFOX 06/18/2018   2.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  Right inguinal hernia.  He will follow-up with Dr. Ninfa Linden.  6.  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  He has tolerated the chemotherapy well.  We will follow-up on the CEA from today.  The plan is to schedule a restaging CT after cycle 5.  He is scheduled to see Dr. Alinda Money next week to discuss exchange of the ureter stents.  He has anemia secondary to chemotherapy, chronic disease, renal failure, and bleeding.  He does not have significant symptoms from anemia today.  We reviewed results of the Foundation 1 testing.  He will not be a candidate for EGFR therapy or immunotherapy.  He may be a candidate for a clinical trial in the future based on the high tumor mutation burden.  Dominica Severin  Benay Spice, MD  06/18/2018  11:08 AM

## 2018-06-18 NOTE — Progress Notes (Signed)
Ok to treat with SCr 1.54 per Dr. Benay Spice

## 2018-06-18 NOTE — Patient Instructions (Signed)
[facility] Discharge Instructions for Patients Receiving Chemotherapy  Today you received the following chemotherapy agents: Oxaliplatin, Leucovorin, and 5-FU.  To help prevent nausea and vomiting after your treatment, we encourage you to take your nausea medication as prescribed.  If you develop nausea and vomiting that is not controlled by your nausea medication, call the clinic.   BELOW ARE SYMPTOMS THAT SHOULD BE REPORTED IMMEDIATELY:  *FEVER GREATER THAN 100.5 F  *CHILLS WITH OR WITHOUT FEVER  NAUSEA AND VOMITING THAT IS NOT CONTROLLED WITH YOUR NAUSEA MEDICATION  *UNUSUAL SHORTNESS OF BREATH  *UNUSUAL BRUISING OR BLEEDING  TENDERNESS IN MOUTH AND THROAT WITH OR WITHOUT PRESENCE OF ULCERS  *URINARY PROBLEMS  *BOWEL PROBLEMS  UNUSUAL RASH Items with * indicate a potential emergency and should be followed up as soon as possible.  Feel free to call the clinic should you have any questions or concerns. The clinic phone number is [phone].  Please show the [facility] at check-in to the Emergency Department and triage nurse.

## 2018-06-20 ENCOUNTER — Inpatient Hospital Stay: Payer: PRIVATE HEALTH INSURANCE

## 2018-06-20 VITALS — BP 128/72 | HR 62 | Temp 98.2°F | Resp 17

## 2018-06-20 DIAGNOSIS — C772 Secondary and unspecified malignant neoplasm of intra-abdominal lymph nodes: Principal | ICD-10-CM

## 2018-06-20 DIAGNOSIS — C187 Malignant neoplasm of sigmoid colon: Secondary | ICD-10-CM

## 2018-06-20 DIAGNOSIS — Z5111 Encounter for antineoplastic chemotherapy: Secondary | ICD-10-CM | POA: Diagnosis not present

## 2018-06-20 MED ORDER — SODIUM CHLORIDE 0.9% FLUSH
10.0000 mL | INTRAVENOUS | Status: DC | PRN
  Administered 2018-06-20: 10 mL
  Filled 2018-06-20: qty 10

## 2018-06-20 MED ORDER — HEPARIN SOD (PORK) LOCK FLUSH 100 UNIT/ML IV SOLN
500.0000 [IU] | Freq: Once | INTRAVENOUS | Status: AC | PRN
  Administered 2018-06-20: 500 [IU]
  Filled 2018-06-20: qty 5

## 2018-06-22 NOTE — H&P (Signed)
  Miguel Gamble  Location: [facility] Patient #: [id number] DOB: 1950-05-10 Married / Language: English / Race: Black or African American Male   History of Present Illness   The patient is a 68 year old male presenting for a post-operative visit. He is here for another postoperative visit status post diverting loop colostomy for metastatic colon cancer. He is currently undergoing chemotherapy. He reports that he is doing very well. His appetite is good. He has had no nausea or vomiting. His biggest complaint is the discomfort from his right inguinal hernia which still is easily reducible.   Diagnostic Studies History Malachi Bonds, CMA; ) Colonoscopy  never  Allergies Malachi Bonds, CMA; No Known Drug Allergies   Medication History (Chemira Jones, CMA; ) No Current Medications Medications Reconciled  Social History Malachi Bonds, CMA;  Alcohol use  Occasional alcohol use. Caffeine use  Coffee. No drug use  Tobacco use  Former smoker.  Family History Malachi Bonds, CMA; Diabetes Mellitus  Brother. Hypertension  Brother. Kidney Disease  Brother. Respiratory Condition  Father, Mother.  Other Problems Colon Cancer     Review of Systems General Not Present- Appetite Loss, Chills, Fatigue, Fever, Night Sweats, Weight Gain and Weight Loss. HEENT Not Present- Earache, Hearing Loss, Hoarseness, Nose Bleed, Oral Ulcers, Ringing in the Ears, Seasonal Allergies, Sinus Pain, Sore Throat, Visual Disturbances, Wears glasses/contact lenses and Yellow Eyes. Respiratory Not Present- Bloody sputum, Chronic Cough, Difficulty Breathing, Snoring and Wheezing. Breast Not Present- Breast Mass, Breast Pain, Nipple Discharge and Skin Changes. Cardiovascular Present- Swelling of Extremities. Not Present- Chest Pain, Difficulty Breathing Lying Down, Leg Cramps, Palpitations, Rapid Heart Rate and Shortness of Breath. Gastrointestinal Present- Hemorrhoids. Not  Present- Abdominal Pain, Bloating, Bloody Stool, Change in Bowel Habits, Chronic diarrhea, Constipation, Difficulty Swallowing, Excessive gas, Gets full quickly at meals, Indigestion, Nausea, Rectal Pain and Vomiting. Male Genitourinary Present- Frequency. Not Present- Blood in Urine, Change in Urinary Stream, Impotence, Nocturia, Painful Urination, Urgency and Urine Leakage. Musculoskeletal Present- Muscle Weakness. Not Present- Back Pain, Joint Pain, Joint Stiffness, Muscle Pain and Swelling of Extremities. Neurological Not Present- Decreased Memory, Fainting, Headaches, Numbness, Seizures, Tingling, Tremor, Trouble walking and Weakness. Psychiatric Not Present- Anxiety, Bipolar, Change in Sleep Pattern, Depression, Fearful and Frequent crying. Hematology Not Present- Blood Thinners, Easy Bruising, Excessive bleeding, Gland problems, HIV and Persistent Infections.  Vitals  Weight: 118.6 lb Height: 68in Body Surface Area: 1.64 m Body Mass Index: 18.03 kg/m  Temp.: 98.26F(Oral)  Pulse: 85 (Regular)  BP: 118/78 (Sitting, Left Arm, Standard)    Physical Exam The physical exam findings are as follows: Note:Today, he really looks good. His abdomen is soft nontender. His ostomy is pink and well-perfused. He has a reducible right inguinal hernia Lungs clear CV RRR Skin with out rash    Assessment & Plan   RIGHT INGUINAL HERNIA S/P LAP DIVERTING COLOSTOMY FOR MET COLON CA  Impression: He continues to do well with chemotherapy for his metastatic colon cancer. At this point, he will continue chemotherapy. We will proceed with an open right inguinal hernia repair with mesh with hopefully a TAP block by anesthesia.  We discussed the risks in detail which include but are not limited to bleeding, infection, injury to surrounding structures, chronic pain, DVT, recurrence, etc.

## 2018-06-23 ENCOUNTER — Ambulatory Visit (HOSPITAL_COMMUNITY)
Admission: RE | Admit: 2018-06-23 | Discharge: 2018-06-23 | Disposition: A | Discharge status: Home / Self Care | Payer: PRIVATE HEALTH INSURANCE | Attending: Surgery | Admitting: Surgery

## 2018-06-23 ENCOUNTER — Other Ambulatory Visit: Payer: Self-pay

## 2018-06-23 ENCOUNTER — Ambulatory Visit (HOSPITAL_COMMUNITY): Payer: PRIVATE HEALTH INSURANCE | Admitting: Certified Registered Nurse Anesthetist

## 2018-06-23 ENCOUNTER — Encounter (HOSPITAL_COMMUNITY): Payer: Self-pay

## 2018-06-23 ENCOUNTER — Encounter (HOSPITAL_COMMUNITY)
Admission: RE | Disposition: A | Discharge status: Home / Self Care | Payer: Self-pay | Source: Home / Self Care | Attending: Surgery

## 2018-06-23 DIAGNOSIS — K4090 Unilateral inguinal hernia, without obstruction or gangrene, not specified as recurrent: Secondary | ICD-10-CM | POA: Insufficient documentation

## 2018-06-23 DIAGNOSIS — Z87891 Personal history of nicotine dependence: Secondary | ICD-10-CM | POA: Insufficient documentation

## 2018-06-23 DIAGNOSIS — C799 Secondary malignant neoplasm of unspecified site: Secondary | ICD-10-CM | POA: Diagnosis not present

## 2018-06-23 DIAGNOSIS — Z933 Colostomy status: Secondary | ICD-10-CM | POA: Insufficient documentation

## 2018-06-23 DIAGNOSIS — C189 Malignant neoplasm of colon, unspecified: Secondary | ICD-10-CM | POA: Insufficient documentation

## 2018-06-23 HISTORY — PX: INGUINAL HERNIA REPAIR: SHX194

## 2018-06-23 SURGERY — REPAIR, HERNIA, INGUINAL, ADULT
Anesthesia: General | Site: Groin | Laterality: Right

## 2018-06-23 MED ORDER — FENTANYL CITRATE (PF) 100 MCG/2ML IJ SOLN: 25.0000 ug | INTRAMUSCULAR | Status: DC | PRN

## 2018-06-23 MED ORDER — ACETAMINOPHEN 500 MG PO TABS
1000.0000 mg | ORAL_TABLET | ORAL | Status: AC
  Administered 2018-06-23: 1000 mg via ORAL
  Filled 2018-06-23: qty 2

## 2018-06-23 MED ORDER — BUPIVACAINE-EPINEPHRINE (PF) 0.25% -1:200000 IJ SOLN
INTRAMUSCULAR | Status: AC
  Filled 2018-06-23: qty 30

## 2018-06-23 MED ORDER — LIDOCAINE 2% (20 MG/ML) 5 ML SYRINGE
INTRAMUSCULAR | Status: DC | PRN
  Administered 2018-06-23: 100 mg via INTRAVENOUS

## 2018-06-23 MED ORDER — LACTATED RINGERS IV SOLN
INTRAVENOUS | Status: DC | PRN
  Administered 2018-06-23: 07:00:00 via INTRAVENOUS

## 2018-06-23 MED ORDER — CEFAZOLIN SODIUM-DEXTROSE 2-4 GM/100ML-% IV SOLN
2.0000 g | INTRAVENOUS | Status: AC
  Administered 2018-06-23: 2 g via INTRAVENOUS
  Filled 2018-06-23: qty 100

## 2018-06-23 MED ORDER — BUPIVACAINE-EPINEPHRINE 0.5% -1:200000 IJ SOLN
INTRAMUSCULAR | Status: DC | PRN
  Administered 2018-06-23: 20 mL

## 2018-06-23 MED ORDER — FENTANYL CITRATE (PF) 100 MCG/2ML IJ SOLN
INTRAMUSCULAR | Status: DC | PRN
  Administered 2018-06-23: 50 ug via INTRAVENOUS

## 2018-06-23 MED ORDER — CHLORHEXIDINE GLUCONATE CLOTH 2 % EX PADS: 6.0000 | MEDICATED_PAD | Freq: Once | CUTANEOUS | Status: DC

## 2018-06-23 MED ORDER — PROPOFOL 10 MG/ML IV BOLUS
INTRAVENOUS | Status: AC
  Filled 2018-06-23: qty 20

## 2018-06-23 MED ORDER — MIDAZOLAM HCL 2 MG/2ML IJ SOLN
INTRAMUSCULAR | Status: AC
  Filled 2018-06-23: qty 2

## 2018-06-23 MED ORDER — ONDANSETRON HCL 4 MG/2ML IJ SOLN
INTRAMUSCULAR | Status: DC | PRN
  Administered 2018-06-23: 4 mg via INTRAVENOUS

## 2018-06-23 MED ORDER — MIDAZOLAM HCL 5 MG/5ML IJ SOLN
INTRAMUSCULAR | Status: DC | PRN
  Administered 2018-06-23: 1 mg via INTRAVENOUS

## 2018-06-23 MED ORDER — FENTANYL CITRATE (PF) 250 MCG/5ML IJ SOLN
INTRAMUSCULAR | Status: AC
  Filled 2018-06-23: qty 5

## 2018-06-23 MED ORDER — PROPOFOL 10 MG/ML IV BOLUS
INTRAVENOUS | Status: DC | PRN
  Administered 2018-06-23: 160 mg via INTRAVENOUS

## 2018-06-23 MED ORDER — ONDANSETRON HCL 4 MG/2ML IJ SOLN
INTRAMUSCULAR | Status: AC
  Filled 2018-06-23: qty 2

## 2018-06-23 MED ORDER — ROCURONIUM BROMIDE 50 MG/5ML IV SOSY
PREFILLED_SYRINGE | INTRAVENOUS | Status: AC
  Filled 2018-06-23: qty 5

## 2018-06-23 MED ORDER — DEXAMETHASONE SODIUM PHOSPHATE 10 MG/ML IJ SOLN
INTRAMUSCULAR | Status: AC
  Filled 2018-06-23: qty 1

## 2018-06-23 MED ORDER — DEXAMETHASONE SODIUM PHOSPHATE 10 MG/ML IJ SOLN
INTRAMUSCULAR | Status: DC | PRN
  Administered 2018-06-23: 10 mg via INTRAVENOUS

## 2018-06-23 MED ORDER — GABAPENTIN 300 MG PO CAPS
300.0000 mg | ORAL_CAPSULE | ORAL | Status: AC
  Administered 2018-06-23: 300 mg via ORAL
  Filled 2018-06-23: qty 1

## 2018-06-23 MED ORDER — 0.9 % SODIUM CHLORIDE (POUR BTL) OPTIME
TOPICAL | Status: DC | PRN
  Administered 2018-06-23: 1000 mL

## 2018-06-23 MED ORDER — LIDOCAINE 2% (20 MG/ML) 5 ML SYRINGE
INTRAMUSCULAR | Status: AC
  Filled 2018-06-23: qty 5

## 2018-06-23 MED ORDER — EPHEDRINE SULFATE 50 MG/ML IJ SOLN
INTRAMUSCULAR | Status: DC | PRN
  Administered 2018-06-23 (×3): 5 mg via INTRAVENOUS

## 2018-06-23 MED ORDER — TRAMADOL HCL 50 MG PO TABS: 50.0000 mg | ORAL_TABLET | Freq: Four times a day (QID) | ORAL | 0 refills | Status: AC | PRN

## 2018-06-23 SURGICAL SUPPLY — 32 items
BLADE CLIPPER SURG (BLADE) ×3 IMPLANT
CHLORAPREP W/TINT 26ML (MISCELLANEOUS) ×3 IMPLANT
COVER SURGICAL LIGHT HANDLE (MISCELLANEOUS) ×3 IMPLANT
COVER WAND RF STERILE (DRAPES) IMPLANT
DERMABOND ADVANCED (GAUZE/BANDAGES/DRESSINGS) ×2
DERMABOND ADVANCED .7 DNX12 (GAUZE/BANDAGES/DRESSINGS) ×1 IMPLANT
DRAIN PENROSE 1/2X12 LTX STRL (WOUND CARE) ×3 IMPLANT
DRAPE LAPAROTOMY TRNSV 102X78 (DRAPE) ×3 IMPLANT
ELECT REM PT RETURN 9FT ADLT (ELECTROSURGICAL) ×3
ELECTRODE REM PT RTRN 9FT ADLT (ELECTROSURGICAL) ×1 IMPLANT
GLOVE SURG SIGNA 7.5 PF LTX (GLOVE) ×3 IMPLANT
GOWN STRL REUS W/ TWL LRG LVL3 (GOWN DISPOSABLE) ×1 IMPLANT
GOWN STRL REUS W/ TWL XL LVL3 (GOWN DISPOSABLE) ×1 IMPLANT
GOWN STRL REUS W/TWL LRG LVL3 (GOWN DISPOSABLE) ×2
GOWN STRL REUS W/TWL XL LVL3 (GOWN DISPOSABLE) ×2
KIT BASIN OR (CUSTOM PROCEDURE TRAY) ×3 IMPLANT
KIT TURNOVER KIT B (KITS) ×3 IMPLANT
MESH PARIETEX PROGRIP RIGHT (Mesh General) ×3 IMPLANT
NEEDLE HYPO 25GX1X1/2 BEV (NEEDLE) ×3 IMPLANT
NS IRRIG 1000ML POUR BTL (IV SOLUTION) ×3 IMPLANT
PACK GENERAL/GYN (CUSTOM PROCEDURE TRAY) ×3 IMPLANT
PAD ARMBOARD 7.5X6 YLW CONV (MISCELLANEOUS) ×3 IMPLANT
PENCIL SMOKE EVACUATOR (MISCELLANEOUS) ×3 IMPLANT
SUT MON AB 4-0 PC3 18 (SUTURE) ×3 IMPLANT
SUT SILK 2 0 SH (SUTURE) IMPLANT
SUT VIC AB 2-0 CT1 27 (SUTURE) ×2
SUT VIC AB 2-0 CT1 TAPERPNT 27 (SUTURE) ×1 IMPLANT
SUT VIC AB 3-0 CT1 27 (SUTURE) ×2
SUT VIC AB 3-0 CT1 TAPERPNT 27 (SUTURE) ×1 IMPLANT
SYR CONTROL 10ML LL (SYRINGE) ×3 IMPLANT
TOWEL OR 17X24 6PK STRL BLUE (TOWEL DISPOSABLE) ×3 IMPLANT
TOWEL OR 17X26 10 PK STRL BLUE (TOWEL DISPOSABLE) ×3 IMPLANT

## 2018-06-23 NOTE — Op Note (Signed)
RIGHT HERNIA REPAIR INGUINAL WITH MESH  Procedure Note  Esequiel Kleinfelter 06/23/2018   Pre-op Diagnosis: RIGHT INGUINAL HERNIA     Post-op Diagnosis: same  Procedure(s): RIGHT HERNIA REPAIR INGUINAL WITH MESH  Surgeon(s): [name], MD  Anesthesia: General  Staff:  Circulator: Cyd Silence, RN Scrub Person: Rozell Searing, RN Circulator Assistant: Carlynn Purl, RN  Estimated Blood Loss: Minimal  Procedure: Options the patient was brought to the operating room and identified as correct patient.  He is placed upon the operating table and general anesthesia was induced.  His abdomen was then prepped and draped in usual sterile fashion.  I attempted an ilioinguinal nerve block with Marcaine and anesthetized the skin in the right inguinal area as well.  I then made a longitudinal incision with a scalpel.  I dissected down through Scarpa's fascia with the cautery.  I then identified the external beak fascia and opened toward the internal and external rings.  The testicular cord and structures were then controlled with a Penrose drain.  He had an indirect hernia sac.  I separated the cord from the sac.  All contents have been reduced.  I tied off the base of the sac with a 2-0 silk suture and then excised the redundant sac.  Next a piece of Prolene pro-grip mesh from [employer] was brought onto the field.  It was placed as an onlay on the inguinal floor and then brought around the cord structures.  I then sutured it to the pubic tubercle and the shelving edge of the inguinal ligament with 2-0 Vicryl sutures.  Wide coverage of the inguinal floor and ring appeared to be achieved.  I then closed external oblique fascia with a running 2-0 Vicryl suture.  I anesthetized the fascia further with Marcaine.  Hemostasis appeared to be achieved.  I then closed the subcutaneous tissue with interrupted 3-0 Vicryl sutures and closed the skin with a running 4-0 Monocryl.  Dermabond was then  applied.  The patient tolerated the procedure well.  All the counts were correct at the end of the procedure.  The patient was then extubated in the operating room and taken in a stable condition to the recovery room.          [name]   Date: 06/23/2018  Time: 8:19 AM

## 2018-06-23 NOTE — Interval H&P Note (Signed)
History and Physical Interval Note: no change in H and P  06/23/2018 7:06 AM  Miguel Gamble  has presented today for surgery, with the diagnosis of RIGHT INGUINAL HERNIA.  The various methods of treatment have been discussed with the patient and family. After consideration of risks, benefits and other options for treatment, the patient has consented to  Procedure(s) with comments: Viking (Right) - GENERAL AND TAP BLOCK as a surgical intervention.  The patient's history has been reviewed, patient examined, no change in status, stable for surgery.  I have reviewed the patient's chart and labs.  Questions were answered to the patient's satisfaction.     [name]

## 2018-06-23 NOTE — Anesthesia Postprocedure Evaluation (Signed)
Anesthesia Post Note  Patient: Miguel Gamble  Procedure(s) Performed: RIGHT HERNIA REPAIR INGUINAL WITH MESH (Right Groin)     Patient location during evaluation: PACU Anesthesia Type: General Level of consciousness: awake Pain management: pain level controlled Vital Signs Assessment: post-procedure vital signs reviewed and stable Respiratory status: spontaneous breathing Cardiovascular status: stable Postop Assessment: no apparent nausea or vomiting Anesthetic complications: no    Last Vitals:  Vitals:   06/23/18 0855 06/23/18 0909  BP: (!) 156/85 (!) 155/85  Pulse: 73 73  Resp: 17 16  Temp: 36.6 C   SpO2: 100% 100%    Last Pain:  Vitals:   06/23/18 0909  PainSc: 0-No pain                 Vishaal Strollo

## 2018-06-23 NOTE — Transfer of Care (Signed)
Immediate Anesthesia Transfer of Care Note  Patient: Miguel Gamble  Procedure(s) Performed: RIGHT HERNIA REPAIR INGUINAL WITH MESH (Right Groin)  Patient Location: PACU  Anesthesia Type:General  Level of Consciousness: awake, patient cooperative and responds to stimulation  Airway & Oxygen Therapy: Patient Spontanous Breathing and Patient connected to nasal cannula oxygen  Post-op Assessment: Report given to RN, Post -op Vital signs reviewed and stable and Patient moving all extremities X 4  Post vital signs: Reviewed and stable  Last Vitals:  Vitals Value Taken Time  BP    Temp    Pulse 77 06/23/2018  8:27 AM  Resp    SpO2 100 % 06/23/2018  8:27 AM  Vitals shown include unvalidated device data.  Last Pain:  Vitals:   06/23/18 0604  PainSc: 0-No pain         Complications: No apparent anesthesia complications

## 2018-06-23 NOTE — Anesthesia Procedure Notes (Addendum)
Procedure Name: LMA Insertion Date/Time: 06/23/2018 7:45 AM Performed by: Belinda Block, MD Pre-anesthesia Checklist: Patient identified, Emergency Drugs available, Suction available and Patient being monitored Patient Re-evaluated:Patient Re-evaluated prior to induction Oxygen Delivery Method: Circle system utilized Preoxygenation: Pre-oxygenation with 100% oxygen Induction Type: IV induction Ventilation: Mask ventilation without difficulty LMA: LMA inserted LMA Size: 4.0 Number of attempts: 1 Placement Confirmation: positive ETCO2 and breath sounds checked- equal and bilateral Tube secured with: Tape Comments: LMA insertion done by Simeon Craft under supervision of Surveyor, mining.

## 2018-06-23 NOTE — Anesthesia Preprocedure Evaluation (Addendum)
Anesthesia Evaluation  Patient identified by MRN, date of birth, ID band Patient awake    Reviewed: Allergy & Precautions, NPO status   Airway Mallampati: II  TM Distance: >3 FB     Dental   Pulmonary pneumonia, former smoker,    breath sounds clear to auscultation       Cardiovascular negative cardio ROS   Rhythm:Regular Rate:Normal     Neuro/Psych    GI/Hepatic History noted. CG   Endo/Other    Renal/GU Renal disease     Musculoskeletal   Abdominal   Peds  Hematology  (+) anemia ,   Anesthesia Other Findings   Reproductive/Obstetrics                            Anesthesia Physical Anesthesia Plan  ASA: III  Anesthesia Plan: General   Post-op Pain Management:    Induction: Intravenous  PONV Risk Score and Plan: 2 and Ondansetron, Dexamethasone and Midazolam  Airway Management Planned: Oral ETT  Additional Equipment:   Intra-op Plan:   Post-operative Plan: Extubation in OR  Informed Consent: I have reviewed the patients History and Physical, chart, labs and discussed the procedure including the risks, benefits and alternatives for the proposed anesthesia with the patient or authorized representative who has indicated his/her understanding and acceptance.     Dental advisory given  Plan Discussed with: Anesthesiologist and CRNA  Anesthesia Plan Comments:        Anesthesia Quick Evaluation

## 2018-06-23 NOTE — Discharge Instructions (Signed)
[facility] _______[facility] ° °UMBILICAL OR INGUINAL HERNIA REPAIR: POST OP INSTRUCTIONS ° °Always review your discharge instruction sheet given to you by the facility where your surgery was performed. °IF YOU HAVE DISABILITY OR FAMILY LEAVE FORMS, YOU MUST BRING THEM TO THE OFFICE FOR PROCESSING.   °DO NOT GIVE THEM TO YOUR DOCTOR. ° °1. A  prescription for pain medication may be given to you upon discharge.  Take your pain medication as prescribed, if needed.  If narcotic pain medicine is not needed, then you may take acetaminophen (Tylenol) or ibuprofen (Advil) as needed. °2. Take your usually prescribed medications unless otherwise directed. °If you need a refill on your pain medication, please contact your pharmacy.  They will contact our office to request authorization. Prescriptions will not be filled after 5 pm or on week-ends. °3. You should follow a light diet the first 24 hours after arrival home, such as soup and crackers, etc.  Be sure to include lots of fluids daily.  Resume your normal diet the day after surgery. °4.Most patients will experience some swelling and bruising around the umbilicus or in the groin and scrotum.  Ice packs and reclining will help.  Swelling and bruising can take several days to resolve.  °6. It is common to experience some constipation if taking pain medication after surgery.  Increasing fluid intake and taking a stool softener (such as Colace) will usually help or prevent this problem from occurring.  A mild laxative (Milk of Magnesia or Miralax) should be taken according to package directions if there are no bowel movements after 48 hours. °7. Unless discharge instructions indicate otherwise, you may remove your bandages 24-48 hours after surgery, and you may shower at that time.  You may have steri-strips (small skin tapes) in place directly over the incision.  These strips should be left on the skin for 7-10 days.  If your surgeon used skin glue on the  incision, you may shower in 24 hours.  The glue will flake off over the next 2-3 weeks.  Any sutures or staples will be removed at the office during your follow-up visit. °8. ACTIVITIES:  You may resume regular (light) daily activities beginning the next day--such as daily self-care, walking, climbing stairs--gradually increasing activities as tolerated.  You may have sexual intercourse when it is comfortable.  Refrain from any heavy lifting or straining until approved by your doctor. ° °a.You may drive when you are no longer taking prescription pain medication, you can comfortably wear a seatbelt, and you can safely maneuver your car and apply brakes. °b.RETURN TO WORK:   °_____________________________________________ ° °9.You should see your doctor in the office for a follow-up appointment approximately 2-3 weeks after your surgery.  Make sure that you call for this appointment within a day or two after you arrive home to insure a convenient appointment time. °10.OTHER INSTRUCTIONS: __OK TO SHOWER STARTING TOMORROW °ICE PACK, TYLENOL, IBUPROFEN ALSO FOR PAIN °NO LIFTING MORE THAN 15 TO 20 POUNDS FOR 4 WEEKS_______________________ °   _____________________________________ ° °WHEN TO CALL YOUR DOCTOR: °1. Fever over 101.0 °2. Inability to urinate °3. Nausea and/or vomiting °4. Extreme swelling or bruising °5. Continued bleeding from incision. °6. Increased pain, redness, or drainage from the incision ° °The clinic staff is available to answer your questions during regular business hours.  Please don’t hesitate to call and ask to speak to one of the nurses for clinical concerns.  If you have a medical emergency, go to the   the nearest emergency room or call 911.  A surgeon from [facility] is always on call at the hospital   [address], [address], [place], [place]  [address] ?  [address] [address], [place],    [address] [phone] ? [phone] ? FAX [phone] Web site:  [email or web address] 

## 2018-06-24 ENCOUNTER — Encounter (HOSPITAL_COMMUNITY): Payer: Self-pay | Admitting: Surgery

## 2018-06-29 ENCOUNTER — Other Ambulatory Visit: Payer: Self-pay | Admitting: Oncology

## 2018-07-03 ENCOUNTER — Encounter: Payer: Self-pay | Admitting: Nurse Practitioner

## 2018-07-03 ENCOUNTER — Inpatient Hospital Stay: Payer: PRIVATE HEALTH INSURANCE

## 2018-07-03 ENCOUNTER — Other Ambulatory Visit: Payer: Self-pay

## 2018-07-03 ENCOUNTER — Inpatient Hospital Stay: Payer: PRIVATE HEALTH INSURANCE | Admitting: Nurse Practitioner

## 2018-07-03 VITALS — BP 124/70 | HR 82 | Temp 98.4°F | Resp 19 | Ht 68.0 in | Wt 131.7 lb

## 2018-07-03 DIAGNOSIS — K4090 Unilateral inguinal hernia, without obstruction or gangrene, not specified as recurrent: Secondary | ICD-10-CM

## 2018-07-03 DIAGNOSIS — K56609 Unspecified intestinal obstruction, unspecified as to partial versus complete obstruction: Secondary | ICD-10-CM

## 2018-07-03 DIAGNOSIS — C7802 Secondary malignant neoplasm of left lung: Secondary | ICD-10-CM | POA: Diagnosis not present

## 2018-07-03 DIAGNOSIS — R634 Abnormal weight loss: Secondary | ICD-10-CM

## 2018-07-03 DIAGNOSIS — C772 Secondary and unspecified malignant neoplasm of intra-abdominal lymph nodes: Secondary | ICD-10-CM

## 2018-07-03 DIAGNOSIS — N1330 Unspecified hydronephrosis: Secondary | ICD-10-CM

## 2018-07-03 DIAGNOSIS — C187 Malignant neoplasm of sigmoid colon: Secondary | ICD-10-CM | POA: Diagnosis not present

## 2018-07-03 DIAGNOSIS — C7801 Secondary malignant neoplasm of right lung: Secondary | ICD-10-CM

## 2018-07-03 DIAGNOSIS — Z933 Colostomy status: Secondary | ICD-10-CM

## 2018-07-03 DIAGNOSIS — Z5111 Encounter for antineoplastic chemotherapy: Secondary | ICD-10-CM | POA: Diagnosis not present

## 2018-07-03 DIAGNOSIS — N179 Acute kidney failure, unspecified: Secondary | ICD-10-CM

## 2018-07-03 DIAGNOSIS — D500 Iron deficiency anemia secondary to blood loss (chronic): Secondary | ICD-10-CM

## 2018-07-03 DIAGNOSIS — D631 Anemia in chronic kidney disease: Secondary | ICD-10-CM

## 2018-07-03 DIAGNOSIS — R630 Anorexia: Secondary | ICD-10-CM

## 2018-07-03 DIAGNOSIS — R591 Generalized enlarged lymph nodes: Secondary | ICD-10-CM

## 2018-07-03 DIAGNOSIS — Z95828 Presence of other vascular implants and grafts: Secondary | ICD-10-CM

## 2018-07-03 DIAGNOSIS — R1900 Intra-abdominal and pelvic swelling, mass and lump, unspecified site: Secondary | ICD-10-CM

## 2018-07-03 DIAGNOSIS — D6481 Anemia due to antineoplastic chemotherapy: Secondary | ICD-10-CM

## 2018-07-03 LAB — CBC WITH DIFFERENTIAL (CANCER CENTER ONLY)
Abs Immature Granulocytes: 0.03 10*3/uL (ref 0.00–0.07)
BASOS ABS: 0 10*3/uL (ref 0.0–0.1)
Basophils Relative: 1 %
EOS ABS: 0.2 10*3/uL (ref 0.0–0.5)
EOS PCT: 3 %
HEMATOCRIT: 26.4 % — AB (ref 39.0–52.0)
Hemoglobin: 8.1 g/dL — ABNORMAL LOW (ref 13.0–17.0)
Immature Granulocytes: 1 %
Lymphocytes Relative: 17 %
Lymphs Abs: 0.9 10*3/uL (ref 0.7–4.0)
MCH: 27.6 pg (ref 26.0–34.0)
MCHC: 30.7 g/dL (ref 30.0–36.0)
MCV: 90.1 fL (ref 80.0–100.0)
Monocytes Absolute: 0.8 10*3/uL (ref 0.1–1.0)
Monocytes Relative: 14 %
Neutro Abs: 3.5 10*3/uL (ref 1.7–7.7)
Neutrophils Relative %: 64 %
Platelet Count: 179 10*3/uL (ref 150–400)
RBC: 2.93 MIL/uL — ABNORMAL LOW (ref 4.22–5.81)
RDW: 16.1 % — AB (ref 11.5–15.5)
WBC Count: 5.4 10*3/uL (ref 4.0–10.5)
nRBC: 0 % (ref 0.0–0.2)

## 2018-07-03 LAB — CMP (CANCER CENTER ONLY)
ALT: 28 U/L (ref 0–44)
AST: 21 U/L (ref 15–41)
Albumin: 3.2 g/dL — ABNORMAL LOW (ref 3.5–5.0)
Alkaline Phosphatase: 90 U/L (ref 38–126)
Anion gap: 10 (ref 5–15)
BUN: 18 mg/dL (ref 8–23)
CO2: 22 mmol/L (ref 22–32)
Calcium: 8.6 mg/dL — ABNORMAL LOW (ref 8.9–10.3)
Chloride: 111 mmol/L (ref 98–111)
Creatinine: 1.56 mg/dL — ABNORMAL HIGH (ref 0.61–1.24)
GFR, Est AFR Am: 52 mL/min — ABNORMAL LOW (ref >60)
GFR, Estimated: 45 mL/min — ABNORMAL LOW (ref >60)
Glucose, Bld: 110 mg/dL — ABNORMAL HIGH (ref 70–99)
POTASSIUM: 4.1 mmol/L (ref 3.5–5.1)
SODIUM: 143 mmol/L (ref 135–145)
Total Bilirubin: 0.5 mg/dL (ref 0.3–1.2)
Total Protein: 6.5 g/dL (ref 6.5–8.1)

## 2018-07-03 LAB — CEA (IN HOUSE-CHCC): CEA (CHCC-In House): 138.74 ng/mL — ABNORMAL HIGH (ref 0.00–5.00)

## 2018-07-03 MED ORDER — DEXAMETHASONE SODIUM PHOSPHATE 10 MG/ML IJ SOLN
INTRAMUSCULAR | Status: AC
  Filled 2018-07-03: qty 1

## 2018-07-03 MED ORDER — SODIUM CHLORIDE 0.9% FLUSH
10.0000 mL | INTRAVENOUS | Status: DC | PRN
  Filled 2018-07-03: qty 10

## 2018-07-03 MED ORDER — SODIUM CHLORIDE 0.9% FLUSH
10.0000 mL | INTRAVENOUS | Status: DC | PRN
  Administered 2018-07-03: 10 mL
  Filled 2018-07-03: qty 10

## 2018-07-03 MED ORDER — PALONOSETRON HCL INJECTION 0.25 MG/5ML
INTRAVENOUS | Status: AC
  Filled 2018-07-03: qty 5

## 2018-07-03 MED ORDER — HEPARIN SOD (PORK) LOCK FLUSH 100 UNIT/ML IV SOLN
500.0000 [IU] | Freq: Once | INTRAVENOUS | Status: DC | PRN
  Filled 2018-07-03: qty 5

## 2018-07-03 MED ORDER — LEUCOVORIN CALCIUM INJECTION 350 MG
400.0000 mg/m2 | Freq: Once | INTRAVENOUS | Status: AC
  Administered 2018-07-03: 652 mg via INTRAVENOUS
  Filled 2018-07-03: qty 32.6

## 2018-07-03 MED ORDER — SODIUM CHLORIDE 0.9 % IV SOLN
2400.0000 mg/m2 | INTRAVENOUS | Status: DC
  Administered 2018-07-03: 3900 mg via INTRAVENOUS
  Filled 2018-07-03: qty 78

## 2018-07-03 MED ORDER — DEXTROSE 5 % IV SOLN
Freq: Once | INTRAVENOUS | Status: AC
  Administered 2018-07-03: 12:00:00 via INTRAVENOUS
  Filled 2018-07-03: qty 250

## 2018-07-03 MED ORDER — OXALIPLATIN CHEMO INJECTION 100 MG/20ML
70.0000 mg/m2 | Freq: Once | INTRAVENOUS | Status: AC
  Administered 2018-07-03: 115 mg via INTRAVENOUS
  Filled 2018-07-03: qty 20

## 2018-07-03 MED ORDER — FLUOROURACIL CHEMO INJECTION 2.5 GM/50ML
400.0000 mg/m2 | Freq: Once | INTRAVENOUS | Status: AC
  Administered 2018-07-03: 650 mg via INTRAVENOUS
  Filled 2018-07-03: qty 13

## 2018-07-03 MED ORDER — DEXTROSE 5 % IV SOLN
Freq: Once | INTRAVENOUS | Status: AC
  Administered 2018-07-03: 11:00:00 via INTRAVENOUS
  Filled 2018-07-03: qty 250

## 2018-07-03 MED ORDER — DEXAMETHASONE SODIUM PHOSPHATE 10 MG/ML IJ SOLN
10.0000 mg | Freq: Once | INTRAMUSCULAR | Status: AC
  Administered 2018-07-03: 10 mg via INTRAVENOUS

## 2018-07-03 MED ORDER — PALONOSETRON HCL INJECTION 0.25 MG/5ML
0.2500 mg | Freq: Once | INTRAVENOUS | Status: AC
  Administered 2018-07-03: 0.25 mg via INTRAVENOUS

## 2018-07-03 NOTE — Progress Notes (Signed)
  Miguel Gamble OFFICE PROGRESS NOTE   Diagnosis: Colon cancer  INTERVAL HISTORY:   Miguel Gamble returns as scheduled.  He completed cycle 4 FOLFOX 06/18/2018.  He denies nausea/vomiting.  No mouth sores.  No diarrhea.  No significant issues with cold sensitivity.  No persistent neuropathy symptoms.  Objective:  Vital signs in last 24 hours:  Blood pressure 124/70, pulse 82, temperature 98.4 F (36.9 C), temperature source Oral, resp. rate 19, height _0  (1.727 m), weight 131 lb 11.2 oz (59.7 kg), SpO2 100 %.    HEENT: No thrush or ulcers. Resp: Lungs clear bilaterally. Cardio: Regular rate and rhythm. GI: Abdomen soft and nontender.  No hepatomegaly.  Left lower quadrant colostomy. Vascular: No leg edema. Neuro: Vibratory sense intact over the fingertips per tuning fork exam. Skin: Palms without erythema. Port-A-Cath without erythema.   Lab Results:  Lab Results  Component Value Date   WBC 5.4 07/03/2018   HGB 8.1 (L) 07/03/2018   HCT 26.4 (L) 07/03/2018   MCV 90.1 07/03/2018   PLT 179 07/03/2018   NEUTROABS 3.5 07/03/2018    Imaging:  No results found.  Medications: I have reviewed the patient's current medications.  Assessment/Plan: 1.Metastatic colon cancer-abdomen/pelvic mass appears to arise from the sigmoid colon  Noncontrast CT 04/14/2018- abdomen/pelvic mass, partial colonic and small bowel obstruction, fistula between the mass and small bowel loops, multiple bladder tumors, left hydronephrosis, bilateral lung metastases, lymphatic tumor spread in the lungs versus pneumonia, precarinal lymph node, retroperitoneal/mesenteric lymphadenopathy  Resection of multiple bladder masses 04/15/2018-adenocarcinoma consistent with a colonic primary, MSS, tumor mutation burden 10, KRAS G12D, No BRAF mutation  Cycle 1 FOLFOX 05/07/2018  Cycle 2 FOLFOX 05/22/2018  Cycle 3 FOLFOX 06/05/2018 (oxaliplatin dose reduced due to increased creatinine)  Cycle 4  FOLFOX 06/18/2018  Cycle 5 FOLFOX 07/03/2018   2.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 He will follow-up with Dr. Ninfa Linden.  Status post right inguinal hernia repair with mesh 06/23/2018.  6.Anemia, multifactorial secondary to chronic disease and blood loss; transfused 2 units of blood 05/24/2018    Disposition: Miguel Gamble appears stable.  He has completed 4 cycles of FOLFOX.  Plan to proceed with cycle 5 today as scheduled.  He will undergo restaging CTs prior to his next visit.  We reviewed the CBC from today.  Counts are adequate for treatment.  He will return for lab, follow-up in 2 weeks.  He will contact the office in the interim with any problems.    Ned Card ANP/GNP-BC   07/03/2018  10:29 AM

## 2018-07-03 NOTE — Patient Instructions (Signed)
[facility] Discharge Instructions for Patients Receiving Chemotherapy  Today you received the following chemotherapy agents: Oxaliplatin, Leucovorin, and 5-FU.  To help prevent nausea and vomiting after your treatment, we encourage you to take your nausea medication as prescribed.  If you develop nausea and vomiting that is not controlled by your nausea medication, call the clinic.   BELOW ARE SYMPTOMS THAT SHOULD BE REPORTED IMMEDIATELY:  *FEVER GREATER THAN 100.5 F  *CHILLS WITH OR WITHOUT FEVER  NAUSEA AND VOMITING THAT IS NOT CONTROLLED WITH YOUR NAUSEA MEDICATION  *UNUSUAL SHORTNESS OF BREATH  *UNUSUAL BRUISING OR BLEEDING  TENDERNESS IN MOUTH AND THROAT WITH OR WITHOUT PRESENCE OF ULCERS  *URINARY PROBLEMS  *BOWEL PROBLEMS  UNUSUAL RASH Items with * indicate a potential emergency and should be followed up as soon as possible.  Feel free to call the clinic should you have any questions or concerns. The clinic phone number is [phone].  Please show the [facility] at check-in to the Emergency Department and triage nurse.

## 2018-07-03 NOTE — Progress Notes (Signed)
Ok to tx per Dr. Benay Spice w/ creatinine 1.56

## 2018-07-05 ENCOUNTER — Other Ambulatory Visit: Payer: Self-pay

## 2018-07-05 ENCOUNTER — Inpatient Hospital Stay: Payer: PRIVATE HEALTH INSURANCE

## 2018-07-05 VITALS — BP 101/59 | HR 73 | Temp 98.6°F | Resp 18

## 2018-07-05 DIAGNOSIS — C772 Secondary and unspecified malignant neoplasm of intra-abdominal lymph nodes: Principal | ICD-10-CM

## 2018-07-05 DIAGNOSIS — Z5111 Encounter for antineoplastic chemotherapy: Secondary | ICD-10-CM | POA: Diagnosis not present

## 2018-07-05 DIAGNOSIS — C187 Malignant neoplasm of sigmoid colon: Secondary | ICD-10-CM

## 2018-07-05 MED ORDER — HEPARIN SOD (PORK) LOCK FLUSH 100 UNIT/ML IV SOLN
500.0000 [IU] | Freq: Once | INTRAVENOUS | Status: AC | PRN
  Administered 2018-07-05: 500 [IU]
  Filled 2018-07-05: qty 5

## 2018-07-05 MED ORDER — SODIUM CHLORIDE 0.9% FLUSH
10.0000 mL | INTRAVENOUS | Status: DC | PRN
  Administered 2018-07-05: 10 mL
  Filled 2018-07-05: qty 10

## 2018-07-05 NOTE — Patient Instructions (Signed)

## 2018-07-07 ENCOUNTER — Other Ambulatory Visit: Payer: Self-pay | Admitting: Nurse Practitioner

## 2018-07-09 ENCOUNTER — Other Ambulatory Visit: Payer: Self-pay

## 2018-07-09 ENCOUNTER — Ambulatory Visit
Admission: RE | Admit: 2018-07-09 | Discharge: 2018-07-09 | Disposition: A | Discharge status: Home / Self Care | Payer: PRIVATE HEALTH INSURANCE | Source: Ambulatory Visit | Attending: Nurse Practitioner | Admitting: Nurse Practitioner

## 2018-07-09 ENCOUNTER — Other Ambulatory Visit: Payer: Self-pay | Admitting: Urology

## 2018-07-09 DIAGNOSIS — C187 Malignant neoplasm of sigmoid colon: Secondary | ICD-10-CM

## 2018-07-09 DIAGNOSIS — C772 Secondary and unspecified malignant neoplasm of intra-abdominal lymph nodes: Principal | ICD-10-CM

## 2018-07-14 ENCOUNTER — Other Ambulatory Visit: Payer: Self-pay | Admitting: Oncology

## 2018-07-17 ENCOUNTER — Inpatient Hospital Stay: Payer: PRIVATE HEALTH INSURANCE | Admitting: Oncology

## 2018-07-17 ENCOUNTER — Inpatient Hospital Stay: Payer: PRIVATE HEALTH INSURANCE | Attending: Nurse Practitioner

## 2018-07-17 ENCOUNTER — Other Ambulatory Visit: Payer: Self-pay

## 2018-07-17 ENCOUNTER — Telehealth: Payer: Self-pay | Admitting: Oncology

## 2018-07-17 ENCOUNTER — Other Ambulatory Visit: Payer: Self-pay | Admitting: *Deleted

## 2018-07-17 ENCOUNTER — Inpatient Hospital Stay: Payer: PRIVATE HEALTH INSURANCE

## 2018-07-17 ENCOUNTER — Ambulatory Visit (HOSPITAL_COMMUNITY)
Admission: RE | Admit: 2018-07-17 | Discharge: 2018-07-17 | Disposition: A | Discharge status: Home / Self Care | Payer: PRIVATE HEALTH INSURANCE | Source: Ambulatory Visit | Attending: Oncology | Admitting: Oncology

## 2018-07-17 VITALS — BP 129/72 | HR 81 | Temp 98.8°F | Resp 18 | Ht 68.0 in | Wt 132.2 lb

## 2018-07-17 DIAGNOSIS — C187 Malignant neoplasm of sigmoid colon: Secondary | ICD-10-CM | POA: Diagnosis not present

## 2018-07-17 DIAGNOSIS — D500 Iron deficiency anemia secondary to blood loss (chronic): Secondary | ICD-10-CM | POA: Diagnosis not present

## 2018-07-17 DIAGNOSIS — Z86718 Personal history of other venous thrombosis and embolism: Secondary | ICD-10-CM | POA: Insufficient documentation

## 2018-07-17 DIAGNOSIS — Z7901 Long term (current) use of anticoagulants: Secondary | ICD-10-CM | POA: Diagnosis not present

## 2018-07-17 DIAGNOSIS — Z5111 Encounter for antineoplastic chemotherapy: Secondary | ICD-10-CM | POA: Diagnosis not present

## 2018-07-17 DIAGNOSIS — Z933 Colostomy status: Secondary | ICD-10-CM | POA: Insufficient documentation

## 2018-07-17 DIAGNOSIS — N19 Unspecified kidney failure: Secondary | ICD-10-CM | POA: Diagnosis not present

## 2018-07-17 DIAGNOSIS — C772 Secondary and unspecified malignant neoplasm of intra-abdominal lymph nodes: Principal | ICD-10-CM

## 2018-07-17 DIAGNOSIS — R918 Other nonspecific abnormal finding of lung field: Secondary | ICD-10-CM

## 2018-07-17 DIAGNOSIS — R630 Anorexia: Secondary | ICD-10-CM | POA: Diagnosis not present

## 2018-07-17 DIAGNOSIS — K56609 Unspecified intestinal obstruction, unspecified as to partial versus complete obstruction: Secondary | ICD-10-CM | POA: Diagnosis not present

## 2018-07-17 DIAGNOSIS — C787 Secondary malignant neoplasm of liver and intrahepatic bile duct: Secondary | ICD-10-CM | POA: Diagnosis not present

## 2018-07-17 DIAGNOSIS — N138 Other obstructive and reflux uropathy: Secondary | ICD-10-CM | POA: Insufficient documentation

## 2018-07-17 DIAGNOSIS — Z95828 Presence of other vascular implants and grafts: Secondary | ICD-10-CM

## 2018-07-17 DIAGNOSIS — R590 Localized enlarged lymph nodes: Secondary | ICD-10-CM

## 2018-07-17 DIAGNOSIS — K4090 Unilateral inguinal hernia, without obstruction or gangrene, not specified as recurrent: Secondary | ICD-10-CM | POA: Insufficient documentation

## 2018-07-17 DIAGNOSIS — R634 Abnormal weight loss: Secondary | ICD-10-CM | POA: Diagnosis not present

## 2018-07-17 LAB — COMPREHENSIVE METABOLIC PANEL
ALT: 13 U/L (ref 0–44)
AST: 18 U/L (ref 15–41)
Albumin: 3.2 g/dL — ABNORMAL LOW (ref 3.5–5.0)
Alkaline Phosphatase: 85 U/L (ref 38–126)
Anion gap: 9 (ref 5–15)
BUN: 17 mg/dL (ref 8–23)
CO2: 22 mmol/L (ref 22–32)
Calcium: 8.4 mg/dL — ABNORMAL LOW (ref 8.9–10.3)
Chloride: 112 mmol/L — ABNORMAL HIGH (ref 98–111)
Creatinine, Ser: 1.56 mg/dL — ABNORMAL HIGH (ref 0.61–1.24)
GFR calc Af Amer: 52 mL/min — ABNORMAL LOW (ref >60)
GFR calc non Af Amer: 45 mL/min — ABNORMAL LOW (ref >60)
Glucose, Bld: 104 mg/dL — ABNORMAL HIGH (ref 70–99)
Potassium: 3.3 mmol/L — ABNORMAL LOW (ref 3.5–5.1)
Sodium: 143 mmol/L (ref 135–145)
Total Bilirubin: 0.5 mg/dL (ref 0.3–1.2)
Total Protein: 6.4 g/dL — ABNORMAL LOW (ref 6.5–8.1)

## 2018-07-17 LAB — CBC WITH DIFFERENTIAL (CANCER CENTER ONLY)
Abs Immature Granulocytes: 0.02 10*3/uL (ref 0.00–0.07)
Basophils Absolute: 0 10*3/uL (ref 0.0–0.1)
Basophils Relative: 1 %
Eosinophils Absolute: 0.1 10*3/uL (ref 0.0–0.5)
Eosinophils Relative: 4 %
HCT: 25.6 % — ABNORMAL LOW (ref 39.0–52.0)
Hemoglobin: 8.1 g/dL — ABNORMAL LOW (ref 13.0–17.0)
Immature Granulocytes: 1 %
Lymphocytes Relative: 24 %
Lymphs Abs: 0.9 10*3/uL (ref 0.7–4.0)
MCH: 28.2 pg (ref 26.0–34.0)
MCHC: 31.6 g/dL (ref 30.0–36.0)
MCV: 89.2 fL (ref 80.0–100.0)
Monocytes Absolute: 0.5 10*3/uL (ref 0.1–1.0)
Monocytes Relative: 13 %
Neutro Abs: 2.2 10*3/uL (ref 1.7–7.7)
Neutrophils Relative %: 57 %
Platelet Count: 148 10*3/uL — ABNORMAL LOW (ref 150–400)
RBC: 2.87 MIL/uL — ABNORMAL LOW (ref 4.22–5.81)
RDW: 16.5 % — ABNORMAL HIGH (ref 11.5–15.5)
WBC Count: 3.7 10*3/uL — ABNORMAL LOW (ref 4.0–10.5)
nRBC: 0 % (ref 0.0–0.2)

## 2018-07-17 LAB — TYPE AND SCREEN
ABO/RH(D): A POS
Antibody Screen: NEGATIVE

## 2018-07-17 MED ORDER — LEUCOVORIN CALCIUM INJECTION 350 MG
400.0000 mg/m2 | Freq: Once | INTRAVENOUS | Status: AC
  Administered 2018-07-17: 652 mg via INTRAVENOUS
  Filled 2018-07-17: qty 32.6

## 2018-07-17 MED ORDER — DEXTROSE 5 % IV SOLN
Freq: Once | INTRAVENOUS | Status: AC
  Administered 2018-07-17: 12:00:00 via INTRAVENOUS
  Filled 2018-07-17: qty 250

## 2018-07-17 MED ORDER — SODIUM CHLORIDE 0.9 % IV SOLN
2400.0000 mg/m2 | INTRAVENOUS | Status: DC
  Administered 2018-07-17: 3900 mg via INTRAVENOUS
  Filled 2018-07-17: qty 78

## 2018-07-17 MED ORDER — PALONOSETRON HCL INJECTION 0.25 MG/5ML
0.2500 mg | Freq: Once | INTRAVENOUS | Status: AC
  Administered 2018-07-17: 0.25 mg via INTRAVENOUS

## 2018-07-17 MED ORDER — HEPARIN SOD (PORK) LOCK FLUSH 100 UNIT/ML IV SOLN
500.0000 [IU] | Freq: Once | INTRAVENOUS | Status: DC | PRN
  Filled 2018-07-17: qty 5

## 2018-07-17 MED ORDER — OXALIPLATIN CHEMO INJECTION 100 MG/20ML
70.0000 mg/m2 | Freq: Once | INTRAVENOUS | Status: AC
  Administered 2018-07-17: 115 mg via INTRAVENOUS
  Filled 2018-07-17: qty 23

## 2018-07-17 MED ORDER — FLUOROURACIL CHEMO INJECTION 2.5 GM/50ML
400.0000 mg/m2 | Freq: Once | INTRAVENOUS | Status: AC
  Administered 2018-07-17: 650 mg via INTRAVENOUS
  Filled 2018-07-17: qty 13

## 2018-07-17 MED ORDER — DEXAMETHASONE SODIUM PHOSPHATE 10 MG/ML IJ SOLN
10.0000 mg | Freq: Once | INTRAMUSCULAR | Status: AC
  Administered 2018-07-17: 10 mg via INTRAVENOUS

## 2018-07-17 MED ORDER — PALONOSETRON HCL INJECTION 0.25 MG/5ML
INTRAVENOUS | Status: AC
  Filled 2018-07-17: qty 5

## 2018-07-17 MED ORDER — SODIUM CHLORIDE 0.9% FLUSH
10.0000 mL | INTRAVENOUS | Status: DC | PRN
  Administered 2018-07-17: 10 mL
  Filled 2018-07-17: qty 10

## 2018-07-17 MED ORDER — DEXAMETHASONE SODIUM PHOSPHATE 10 MG/ML IJ SOLN
INTRAMUSCULAR | Status: AC
  Filled 2018-07-17: qty 1

## 2018-07-17 MED ORDER — SODIUM CHLORIDE 0.9% FLUSH
10.0000 mL | INTRAVENOUS | Status: DC | PRN
  Filled 2018-07-17: qty 10

## 2018-07-17 MED ORDER — RIVAROXABAN (XARELTO) VTE STARTER PACK (15 & 20 MG): ORAL_TABLET | ORAL | 0 refills | Status: DC

## 2018-07-17 MED FILL — XARELTO STARTER PACK: 15 & 20 | 30 days supply | Qty: 51 | Fill #0

## 2018-07-17 NOTE — Progress Notes (Signed)
[facility] OFFICE PROGRESS NOTE   Diagnosis: Colon cancer  INTERVAL HISTORY:   Miguel Gamble completed another cycle of FOLFOX on 07/03/2018.  No nausea/vomiting, mouth sores, diarrhea, or neuropathy symptoms.  He feels well.  Good appetite.  He underwent right inguinal hernia repair by Dr. Ninfa Linden on 06/23/2018.  He has mild remaining soreness at the right groin.  He takes Tylenol.  He noted swelling of the right ankle last week.  This has resolved.  Objective:  Vital signs in last 24 hours:  Blood pressure 129/72, pulse 81, temperature 98.8 F (37.1 C), temperature source Oral, resp. rate 18, height _0  (1.727 m), weight 132 lb 3.2 oz (60 kg), SpO2 100 %.    HEENT: No thrush or ulcers  GI: No hepatomegaly, nontender, left lower quadrant colostomy with brown stool Vascular: Trace edema throughout the right lower leg with venous engorgement at the right calf, no erythema or tenderness  Skin: Dryness of the palms  Portacath/PICC-without erythema  Lab Results:  Lab Results  Component Value Date   WBC 3.7 (L) 07/17/2018   HGB 8.1 (L) 07/17/2018   HCT 25.6 (L) 07/17/2018   MCV 89.2 07/17/2018   PLT 148 (L) 07/17/2018   NEUTROABS 2.2 07/17/2018    CMP  Lab Results  Component Value Date   NA 143 07/17/2018   K 3.3 (L) 07/17/2018   CL 112 (H) 07/17/2018   CO2 22 07/17/2018   GLUCOSE 104 (H) 07/17/2018   BUN 17 07/17/2018   CREATININE 1.56 (H) 07/17/2018   CALCIUM 8.4 (L) 07/17/2018   PROT 6.4 (L) 07/17/2018   ALBUMIN 3.2 (L) 07/17/2018   AST 18 07/17/2018   ALT 13 07/17/2018   ALKPHOS 85 07/17/2018   BILITOT 0.5 07/17/2018   GFRNONAA 45 (L) 07/17/2018   GFRAA 52 (L) 07/17/2018    Lab Results  Component Value Date   CEA1 138.74 (H) 07/03/2018    Lab Results  Component Value Date   INR 1.24 04/21/2018    Imaging:  Vas [place] Lower Extremity Venous (dvt)  Result Date: 07/17/2018  Lower Venous Study Indications: Swelling.  Performing  Technologist: Abram Sander RVS  Examination Guidelines: A complete evaluation includes B-mode imaging, spectral Doppler, color Doppler, and power Doppler as needed of all accessible portions of each vessel. Bilateral testing is considered an integral part of a complete examination. Limited examinations for reoccurring indications may be performed as noted.  Right Venous Findings: +---------+---------------+---------+-----------+----------+-------+          CompressibilityPhasicitySpontaneityPropertiesSummary +---------+---------------+---------+-----------+----------+-------+ CFV      None           Yes      Yes                  Acute   +---------+---------------+---------+-----------+----------+-------+ SFJ      Full                                                 +---------+---------------+---------+-----------+----------+-------+ FV Prox  None                                         Acute   +---------+---------------+---------+-----------+----------+-------+ FV Mid   None  Acute   +---------+---------------+---------+-----------+----------+-------+ FV DistalNone                                         Acute   +---------+---------------+---------+-----------+----------+-------+ PFV      None                                         Acute   +---------+---------------+---------+-----------+----------+-------+ POP      None           No       No                   Acute   +---------+---------------+---------+-----------+----------+-------+ PTV      None                                         Acute   +---------+---------------+---------+-----------+----------+-------+ PERO     None                                         Acute   +---------+---------------+---------+-----------+----------+-------+ Gastroc  None                                         Acute    +---------+---------------+---------+-----------+----------+-------+  Left Venous Findings: +---+---------------+---------+-----------+----------+-------+    CompressibilityPhasicitySpontaneityPropertiesSummary +---+---------------+---------+-----------+----------+-------+ CFVFull           Yes      Yes                          +---+---------------+---------+-----------+----------+-------+    Summary: Right: Findings consistent with acute deep vein thrombosis involving the right common femoral vein, right femoral vein, right proximal profunda vein, right popliteal vein, right posterior tibial vein, right peroneal vein, and right gastrocnemius vein. No  cystic structure found in the popliteal fossa. Left: No evidence of common femoral vein obstruction.  *See table(s) above for measurements and observations.    Preliminary     Medications: I have reviewed the patient's current medications.   Assessment/Plan: 1. Metastatic colon cancer-abdomen/pelvic mass appears to arise from the sigmoid colon  Noncontrast CT 04/14/2018- abdomen/pelvic mass, partial colonic and small bowel obstruction, fistula between the mass and small bowel loops, multiple bladder tumors, left hydronephrosis, bilateral lung metastases, lymphatic tumor spread in the lungs versus pneumonia, precarinal lymph node, retroperitoneal/mesenteric lymphadenopathy  Resection of multiple bladder masses 04/15/2018-adenocarcinoma consistent with a colonic primary, MSS, tumor mutation burden 10, KRAS G12D, No BRAF mutation  Cycle 1 FOLFOX 05/07/2018  Cycle 2 FOLFOX 05/22/2018  Cycle 3 FOLFOX 06/05/2018 (oxaliplatin dose reduced due to increased creatinine)  Cycle 4 FOLFOX 06/18/2018  Cycle 5 FOLFOX 07/03/2018  CT 07/09/2018- no significant change in lung nodules, resolution of airspace disease, decreased size of liver metastases, near complete resolution of bowel obstruction, new diverting colostomy  Cycle 6 FOLFOX 07/17/2018    2.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 He will follow-up with Dr. Ninfa Linden.  Status post right inguinal hernia repair with mesh 06/23/2018.  6.Anemia, multifactorial secondary to chronic disease and blood loss; transfused 2 units of blood 05/24/2018  7.  Right lower extremity deep vein thrombosis 07/17/2018- started on Xarelto      Disposition: Mr. Seider has completed 5 cycles of FOLFOX.  His performance status has improved.  He is tolerating the chemotherapy well.  I reviewed the CT images and discussed with the radiologist.  The lung nodules appear unchanged.  The liver lesions are smaller.  It is difficult to measure the pelvic mass.  The CEA is lower.  The overall findings are consistent with a clinical response to treatment.  The plan is to continue FOLFOX.  I discussed the CT findings and treatment plan with Mr. Magnan, and with his wife by telephone.  A Doppler of the right leg confirmed a deep vein thrombosis.  He will begin Xarelto anticoagulation.  He is scheduled for ureter stent exchange by Dr. Alinda Money on 08/04/2018.  We will alert Dr. Alinda Money of the DVT.  40 minutes were spent with the patient today.  The majority of the time was used for counseling and coordination of care.  Betsy Coder, MD  07/17/2018  1:57 PM

## 2018-07-17 NOTE — Progress Notes (Signed)
Per Dr. Benay Spice: OK to treat w/K+ 3.2 and Creatinine 1.56

## 2018-07-17 NOTE — Patient Instructions (Signed)
Informed patient that he was + for DVT (extensive) in right leg. Will begin Xarelto 15 mg bid x 21 days, then 20 mg daily. He will begin tonight taking each dose with food and full glass of water. [place] since his pharmacy does not have the starter pack and will require 2 days to receive the medication. Provided printed and verbal instructions on medication and instructed him to let Dr. Alinda Money know he is now on a blood thinner. Provided a map on how to get to pharmacy and they close today at 6 pm.

## 2018-07-17 NOTE — Patient Instructions (Addendum)
[facility] Discharge Instructions for Patients Receiving Chemotherapy  Today you received the following chemotherapy agents: Oxaliplatin, Leucovorin, and 5-FU.  To help prevent nausea and vomiting after your treatment, we encourage you to take your nausea medication as prescribed.  If you develop nausea and vomiting that is not controlled by your nausea medication, call the clinic.   BELOW ARE SYMPTOMS THAT SHOULD BE REPORTED IMMEDIATELY:  *FEVER GREATER THAN 100.5 F  *CHILLS WITH OR WITHOUT FEVER  NAUSEA AND VOMITING THAT IS NOT CONTROLLED WITH YOUR NAUSEA MEDICATION  *UNUSUAL SHORTNESS OF BREATH  *UNUSUAL BRUISING OR BLEEDING  TENDERNESS IN MOUTH AND THROAT WITH OR WITHOUT PRESENCE OF ULCERS  *URINARY PROBLEMS  *BOWEL PROBLEMS  UNUSUAL RASH Items with * indicate a potential emergency and should be followed up as soon as possible.  Feel free to call the clinic should you have any questions or concerns. The clinic phone number is [phone].  Please show the [facility] at check-in to the Emergency Department and triage nurse.  Coronavirus (COVID-19) Are you at risk?  Are you at risk for the Coronavirus (COVID-19)?  To be considered HIGH RISK for Coronavirus (COVID-19), you have to meet the following criteria:  . Traveled to [place], [place], [place], [place] or [place]; or in the [place] to [place], [place], [place], or [place]; and have fever, cough, and shortness of breath within the last 2 weeks of travel OR . Been in close contact with a person diagnosed with COVID-19 within the last 2 weeks and have fever, cough, and shortness of breath . IF YOU DO NOT MEET THESE CRITERIA, YOU ARE CONSIDERED LOW RISK FOR COVID-19.  What to do if you are HIGH RISK for COVID-19?  Marland Kitchen If you are having a medical emergency, call 911. . Seek medical care right away. Before you go to a doctor's office, urgent care or emergency department, call ahead and  tell them about your recent travel, contact with someone diagnosed with COVID-19, and your symptoms. You should receive instructions from your physician's office regarding next steps of care.  . When you arrive at healthcare provider, tell the healthcare staff immediately you have returned from visiting [place], [place], [place], [place] or [place]; or traveled in the [place] to [place], [place], [place], or [place]; in the last two weeks or you have been in close contact with a person diagnosed with COVID-19 in the last 2 weeks.   . Tell the health care staff about your symptoms: fever, cough and shortness of breath. . After you have been seen by a medical provider, you will be either: o Tested for (COVID-19) and discharged home on quarantine except to seek medical care if symptoms worsen, and asked to  - Stay home and avoid contact with others until you get your results (4-5 days)  - Avoid travel on public transportation if possible (such as bus, train, or airplane) or o Sent to the Emergency Department by EMS for evaluation, COVID-19 testing, and possible admission depending on your condition and test results.  What to do if you are LOW RISK for COVID-19?  Reduce your risk of any infection by using the same precautions used for avoiding the common cold or flu:  Marland Kitchen Wash your hands often with soap and warm water for at least 20 seconds.  If soap and water are not readily available, use an alcohol-based hand sanitizer with at least 60% alcohol.  . If coughing  or sneezing, cover your mouth and nose by coughing or sneezing into the elbow areas of your shirt or coat, into a tissue or into your sleeve (not your hands). . Avoid shaking hands with others and consider head nods or verbal greetings only. . Avoid touching your eyes, nose, or mouth with unwashed hands.  . Avoid close contact with people who are sick. . Avoid places or events with large numbers of people in one location, like  concerts or sporting events. . Carefully consider travel plans you have or are making. . If you are planning any travel outside or inside the [place], visit the CDC's Travelers' Health webpage for the latest health notices. . If you have some symptoms but not all symptoms, continue to monitor at home and seek medical attention if your symptoms worsen. . If you are having a medical emergency, call 911.   [facility] / e-Visit: [email or web address]         [facility]: [facility]: [phone]                   [facility]: [phone]

## 2018-07-17 NOTE — Telephone Encounter (Signed)
Scheduled appt per 4/2 los.  Added treatment to the for approval. (4/16)

## 2018-07-17 NOTE — Progress Notes (Signed)
Lower extremity venous duplex has been completed.   Preliminary results in CV Proc.   Abram Sander 07/17/2018 1:20 PM

## 2018-07-19 ENCOUNTER — Inpatient Hospital Stay: Payer: PRIVATE HEALTH INSURANCE

## 2018-07-19 ENCOUNTER — Other Ambulatory Visit: Payer: Self-pay

## 2018-07-19 VITALS — BP 135/71 | HR 71 | Temp 98.6°F | Resp 18

## 2018-07-19 DIAGNOSIS — C187 Malignant neoplasm of sigmoid colon: Secondary | ICD-10-CM

## 2018-07-19 DIAGNOSIS — Z5111 Encounter for antineoplastic chemotherapy: Secondary | ICD-10-CM | POA: Diagnosis not present

## 2018-07-19 DIAGNOSIS — C772 Secondary and unspecified malignant neoplasm of intra-abdominal lymph nodes: Principal | ICD-10-CM

## 2018-07-19 MED ORDER — HEPARIN SOD (PORK) LOCK FLUSH 100 UNIT/ML IV SOLN
500.0000 [IU] | Freq: Once | INTRAVENOUS | Status: AC | PRN
  Administered 2018-07-19: 500 [IU]
  Filled 2018-07-19: qty 5

## 2018-07-19 MED ORDER — SODIUM CHLORIDE 0.9% FLUSH
10.0000 mL | INTRAVENOUS | Status: DC | PRN
  Administered 2018-07-19: 10 mL
  Filled 2018-07-19: qty 10

## 2018-07-21 ENCOUNTER — Encounter: Payer: Self-pay | Admitting: *Deleted

## 2018-07-21 NOTE — Progress Notes (Signed)
Spoke to Merceda Elks, RN for Dr. Benay Spice about orders for labs to be done on patient at the [facility] on 07/31/2018.Patient has a pre-op appointment with the pre-op nurse on 07/30/2018 for instructions and labs for surgery on 08/04/2018. Per Merceda Elks, RN for Dr. Benay Spice, patient  will have the labs drawn at the [facility] for his visit on 07/31/2018 and include the labs that Dr. Alinda Money wants drawn.

## 2018-07-21 NOTE — Progress Notes (Signed)
Spoke with preadmissions nurse. We will do labs at [facility] on 07/31/18 and they will not draw labs. Dr. Alinda Money wanted a BMP and we are doing a CMP.

## 2018-07-23 ENCOUNTER — Telehealth: Payer: Self-pay | Admitting: Oncology

## 2018-07-23 NOTE — Telephone Encounter (Signed)
Called patient to inform patient that the treatment for 4/16 has been added.  Left a VM of that scheduled appt.

## 2018-07-29 ENCOUNTER — Other Ambulatory Visit: Payer: Self-pay

## 2018-07-29 ENCOUNTER — Encounter (HOSPITAL_COMMUNITY): Payer: Self-pay | Admitting: *Deleted

## 2018-07-29 NOTE — Progress Notes (Signed)
Patient was instructed to call office of Dr Sherre Poot message with surgery scheduler, Coni Mabe at EXT- [phone] and ask in regards to Xarelto preop instructions.  Patient voiced understanding.

## 2018-07-30 ENCOUNTER — Inpatient Hospital Stay (HOSPITAL_COMMUNITY)
Admission: RE | Admit: 2018-07-30 | Discharge: 2018-07-30 | Disposition: A | Discharge status: Home / Self Care | Payer: PRIVATE HEALTH INSURANCE | Source: Ambulatory Visit

## 2018-07-30 ENCOUNTER — Other Ambulatory Visit (HOSPITAL_COMMUNITY): Payer: PRIVATE HEALTH INSURANCE

## 2018-07-31 ENCOUNTER — Other Ambulatory Visit: Payer: Self-pay

## 2018-07-31 ENCOUNTER — Telehealth: Payer: Self-pay | Admitting: Nurse Practitioner

## 2018-07-31 ENCOUNTER — Inpatient Hospital Stay: Payer: PRIVATE HEALTH INSURANCE

## 2018-07-31 ENCOUNTER — Encounter: Payer: Self-pay | Admitting: Nurse Practitioner

## 2018-07-31 ENCOUNTER — Inpatient Hospital Stay (HOSPITAL_BASED_OUTPATIENT_CLINIC_OR_DEPARTMENT_OTHER): Payer: PRIVATE HEALTH INSURANCE | Admitting: Nurse Practitioner

## 2018-07-31 VITALS — BP 113/66 | HR 83 | Temp 98.5°F | Resp 18 | Ht 68.0 in | Wt 135.5 lb

## 2018-07-31 DIAGNOSIS — C772 Secondary and unspecified malignant neoplasm of intra-abdominal lymph nodes: Principal | ICD-10-CM

## 2018-07-31 DIAGNOSIS — R918 Other nonspecific abnormal finding of lung field: Secondary | ICD-10-CM

## 2018-07-31 DIAGNOSIS — Z95828 Presence of other vascular implants and grafts: Secondary | ICD-10-CM

## 2018-07-31 DIAGNOSIS — Z86718 Personal history of other venous thrombosis and embolism: Secondary | ICD-10-CM

## 2018-07-31 DIAGNOSIS — K4090 Unilateral inguinal hernia, without obstruction or gangrene, not specified as recurrent: Secondary | ICD-10-CM

## 2018-07-31 DIAGNOSIS — Z7901 Long term (current) use of anticoagulants: Secondary | ICD-10-CM

## 2018-07-31 DIAGNOSIS — N138 Other obstructive and reflux uropathy: Secondary | ICD-10-CM

## 2018-07-31 DIAGNOSIS — R630 Anorexia: Secondary | ICD-10-CM

## 2018-07-31 DIAGNOSIS — Z933 Colostomy status: Secondary | ICD-10-CM

## 2018-07-31 DIAGNOSIS — R590 Localized enlarged lymph nodes: Secondary | ICD-10-CM

## 2018-07-31 DIAGNOSIS — C187 Malignant neoplasm of sigmoid colon: Secondary | ICD-10-CM

## 2018-07-31 DIAGNOSIS — D500 Iron deficiency anemia secondary to blood loss (chronic): Secondary | ICD-10-CM

## 2018-07-31 DIAGNOSIS — Z5111 Encounter for antineoplastic chemotherapy: Secondary | ICD-10-CM | POA: Diagnosis not present

## 2018-07-31 DIAGNOSIS — C787 Secondary malignant neoplasm of liver and intrahepatic bile duct: Secondary | ICD-10-CM

## 2018-07-31 DIAGNOSIS — K56609 Unspecified intestinal obstruction, unspecified as to partial versus complete obstruction: Secondary | ICD-10-CM

## 2018-07-31 LAB — CBC WITH DIFFERENTIAL/PLATELET
Abs Immature Granulocytes: 0.01 10*3/uL (ref 0.00–0.07)
Basophils Absolute: 0 10*3/uL (ref 0.0–0.1)
Basophils Relative: 1 %
Eosinophils Absolute: 0.2 10*3/uL (ref 0.0–0.5)
Eosinophils Relative: 5 %
HCT: 26.2 % — ABNORMAL LOW (ref 39.0–52.0)
Hemoglobin: 8.2 g/dL — ABNORMAL LOW (ref 13.0–17.0)
Immature Granulocytes: 0 %
Lymphocytes Relative: 24 %
Lymphs Abs: 0.9 10*3/uL (ref 0.7–4.0)
MCH: 28.5 pg (ref 26.0–34.0)
MCHC: 31.3 g/dL (ref 30.0–36.0)
MCV: 91 fL (ref 80.0–100.0)
Monocytes Absolute: 0.5 10*3/uL (ref 0.1–1.0)
Monocytes Relative: 14 %
Neutro Abs: 2.1 10*3/uL (ref 1.7–7.7)
Neutrophils Relative %: 56 %
Platelets: 168 10*3/uL (ref 150–400)
RBC: 2.88 MIL/uL — ABNORMAL LOW (ref 4.22–5.81)
RDW: 17.5 % — ABNORMAL HIGH (ref 11.5–15.5)
WBC: 3.8 10*3/uL — ABNORMAL LOW (ref 4.0–10.5)
nRBC: 0 % (ref 0.0–0.2)

## 2018-07-31 LAB — CBC WITH DIFFERENTIAL (CANCER CENTER ONLY)

## 2018-07-31 LAB — CMP (CANCER CENTER ONLY)
ALT: 34 U/L (ref 0–44)
AST: 149 U/L — ABNORMAL HIGH (ref 15–41)
Albumin: 3.1 g/dL — ABNORMAL LOW (ref 3.5–5.0)
Alkaline Phosphatase: 81 U/L (ref 38–126)
Anion gap: 11 (ref 5–15)
BUN: 15 mg/dL (ref 8–23)
CO2: 22 mmol/L (ref 22–32)
Calcium: 8.6 mg/dL — ABNORMAL LOW (ref 8.9–10.3)
Chloride: 111 mmol/L (ref 98–111)
Creatinine: 1.62 mg/dL — ABNORMAL HIGH (ref 0.61–1.24)
GFR, Est AFR Am: 49 mL/min — ABNORMAL LOW (ref >60)
GFR, Estimated: 43 mL/min — ABNORMAL LOW (ref >60)
Glucose, Bld: 108 mg/dL — ABNORMAL HIGH (ref 70–99)
Potassium: 3.8 mmol/L (ref 3.5–5.1)
Sodium: 144 mmol/L (ref 135–145)
Total Bilirubin: 0.5 mg/dL (ref 0.3–1.2)
Total Protein: 6.4 g/dL — ABNORMAL LOW (ref 6.5–8.1)

## 2018-07-31 LAB — CEA (IN HOUSE-CHCC): CEA (CHCC-In House): 64.49 ng/mL — ABNORMAL HIGH (ref 0.00–5.00)

## 2018-07-31 MED ORDER — SODIUM CHLORIDE 0.9% FLUSH
10.0000 mL | INTRAVENOUS | Status: DC | PRN
  Administered 2018-07-31: 10 mL
  Filled 2018-07-31: qty 10

## 2018-07-31 MED ORDER — DEXTROSE 5 % IV SOLN
Freq: Once | INTRAVENOUS | Status: AC
  Administered 2018-07-31: 12:00:00 via INTRAVENOUS
  Filled 2018-07-31: qty 250

## 2018-07-31 MED ORDER — RIVAROXABAN 20 MG PO TABS: 20.0000 mg | ORAL_TABLET | Freq: Every day | ORAL | 5 refills | Status: DC

## 2018-07-31 MED ORDER — SODIUM CHLORIDE 0.9 % IV SOLN
2400.0000 mg/m2 | INTRAVENOUS | Status: DC
  Administered 2018-07-31: 3900 mg via INTRAVENOUS
  Filled 2018-07-31: qty 78

## 2018-07-31 MED ORDER — PALONOSETRON HCL INJECTION 0.25 MG/5ML
INTRAVENOUS | Status: AC
  Filled 2018-07-31: qty 5

## 2018-07-31 MED ORDER — LEUCOVORIN CALCIUM INJECTION 350 MG
400.0000 mg/m2 | Freq: Once | INTRAVENOUS | Status: AC
  Administered 2018-07-31: 652 mg via INTRAVENOUS
  Filled 2018-07-31: qty 32.6

## 2018-07-31 MED ORDER — DEXAMETHASONE SODIUM PHOSPHATE 10 MG/ML IJ SOLN
INTRAMUSCULAR | Status: AC
  Filled 2018-07-31: qty 1

## 2018-07-31 MED ORDER — OXALIPLATIN CHEMO INJECTION 100 MG/20ML
70.0000 mg/m2 | Freq: Once | INTRAVENOUS | Status: AC
  Administered 2018-07-31: 13:00:00 115 mg via INTRAVENOUS
  Filled 2018-07-31: qty 20

## 2018-07-31 MED ORDER — PALONOSETRON HCL INJECTION 0.25 MG/5ML
0.2500 mg | Freq: Once | INTRAVENOUS | Status: AC
  Administered 2018-07-31: 0.25 mg via INTRAVENOUS

## 2018-07-31 MED ORDER — DEXAMETHASONE SODIUM PHOSPHATE 10 MG/ML IJ SOLN
10.0000 mg | Freq: Once | INTRAMUSCULAR | Status: AC
  Administered 2018-07-31: 10 mg via INTRAVENOUS

## 2018-07-31 MED ORDER — FLUOROURACIL CHEMO INJECTION 2.5 GM/50ML
400.0000 mg/m2 | Freq: Once | INTRAVENOUS | Status: AC
  Administered 2018-07-31: 650 mg via INTRAVENOUS
  Filled 2018-07-31: qty 13

## 2018-07-31 NOTE — Progress Notes (Signed)
  [facility] OFFICE PROGRESS NOTE   Diagnosis: Colon cancer  INTERVAL HISTORY:   Miguel Gamble returns as scheduled.  He completed another cycle of FOLFOX 07/17/2018.  He denies nausea/vomiting.  No mouth sores.  No diarrhea.  He notes cold sensitivity for several days after each treatment.  No persistent neuropathy symptoms.  Right leg swelling is better.  He denies bleeding.  Objective:  Vital signs in last 24 hours:  Blood pressure 113/66, pulse 83, temperature 98.5 F (36.9 C), temperature source Oral, resp. rate 18, height 5' 8" (1.727 m), weight 135 lb 8 oz (61.5 kg), SpO2 100 %.    HEENT: Mild white coating over tongue.  No buccal thrush. Resp: Respirations even and unlabored. Vascular: Right lower leg is larger than the left lower leg. Neuro: Vibratory sense intact over the fingertips per tuning fork exam. Skin: Palms without erythema. Port-A-Cath without erythema.   Lab Results:  Lab Results  Component Value Date   WBC 3.8 (L) 07/31/2018   HGB 8.2 (L) 07/31/2018   HCT 26.2 (L) 07/31/2018   MCV 91.0 07/31/2018   PLT 168 07/31/2018   NEUTROABS 2.1 07/31/2018    Imaging:  No results found.  Medications: I have reviewed the patient's current medications.  Assessment/Plan: 1. Metastatic colon cancer-abdomen/pelvic mass appears to arise from the sigmoid colon  Noncontrast CT 04/14/2018- abdomen/pelvic mass, partial colonic and small bowel obstruction, fistula between the mass and small bowel loops, multiple bladder tumors, left hydronephrosis, bilateral lung metastases, lymphatic tumor spread in the lungs versus pneumonia, precarinal lymph node, retroperitoneal/mesenteric lymphadenopathy  Resection of multiple bladder masses 04/15/2018-adenocarcinoma consistent with a colonic primary, MSS,tumor mutation burden 10, KRAS G12D, No BRAF mutation  Cycle 1 FOLFOX 05/07/2018  Cycle 2 FOLFOX 05/22/2018  Cycle 3 FOLFOX 06/05/2018 (oxaliplatin dose reduced  due to increased creatinine)  Cycle 4 FOLFOX 06/18/2018  Cycle 5 FOLFOX 07/03/2018  CT 07/09/2018- no significant change in lung nodules, resolution of airspace disease, decreased size of liver metastases, near complete resolution of bowel obstruction, new diverting colostomy  Cycle 6 FOLFOX 07/17/2018  Cycle 7 FOLFOX 07/31/2018   2.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 He will follow-up with Dr. Ninfa Linden.  Status post right inguinal hernia repair with mesh 06/23/2018.  6.Anemia, multifactorial secondary to chronic disease and blood loss; transfused 2 units of blood 05/24/2018  7.  Right lower extremity deep vein thrombosis 07/17/2018- started on Xarelto    Disposition: [name] appears stable.  He has completed 6 cycles of FOLFOX.  Plan to proceed with cycle 7 today as scheduled.  We reviewed the labs from today.  Counts are adequate for treatment.  Creatinine is stable.  AST is elevated, likely related to the chemotherapy.  He will return for lab, follow-up and the next cycle of chemotherapy in 2 weeks.  He will contact the office in the interim with any problems.  Plan reviewed with Dr. Benay Spice.    Miguel Gamble ANP/GNP-BC   07/31/2018  10:56 AM

## 2018-07-31 NOTE — Telephone Encounter (Signed)
Scheduled appt per 4/16 los. °

## 2018-07-31 NOTE — Patient Instructions (Signed)
[facility] Discharge Instructions for Patients Receiving Chemotherapy  Today you received the following chemotherapy agents: Oxaliplatin, Leucovorin, and 5FU.  To help prevent nausea and vomiting after your treatment, we encourage you to take your nausea medication as directed.   If you develop nausea and vomiting that is not controlled by your nausea medication, call the clinic.   BELOW ARE SYMPTOMS THAT SHOULD BE REPORTED IMMEDIATELY:  *FEVER GREATER THAN 100.5 F  *CHILLS WITH OR WITHOUT FEVER  NAUSEA AND VOMITING THAT IS NOT CONTROLLED WITH YOUR NAUSEA MEDICATION  *UNUSUAL SHORTNESS OF BREATH  *UNUSUAL BRUISING OR BLEEDING  TENDERNESS IN MOUTH AND THROAT WITH OR WITHOUT PRESENCE OF ULCERS  *URINARY PROBLEMS  *BOWEL PROBLEMS  UNUSUAL RASH Items with * indicate a potential emergency and should be followed up as soon as possible.  Feel free to call the clinic should you have any questions or concerns. The clinic phone number is [phone].  Please show the CHEMO ALERT CARD at check-in to the Emergency Department and triage nurse.    

## 2018-07-31 NOTE — Progress Notes (Signed)
Per today's OV note from Ned Card NP, ok to treat with elevated creatinine and AST.

## 2018-08-01 NOTE — Progress Notes (Signed)
Anesthesia Chart Review   Case:  [id number] Date/Time:  08/04/18 1058   Procedure:  CYSTOSCOPY WITH STENT EXCHANGE (Bilateral )   Anesthesia type:  General   Pre-op diagnosis:  BILATERAL URETERAL OBSTRUCTION   Location:  [facility] / [facility]   Surgeon:  Raynelle Bring, MD      DISCUSSION: 68 yo former smoker (quit 04/16/85) with h/o anemia (Hemoglobin 8.2 on 07/31/18, stable, recheck in two weeks with next cycle of chemo) , right leg DVT 07/17/18 on Xarelto, renal insufficieny (creatinine stable), colon cancer (diverting loop colostomy 04/17/18, port-a-cath in place, completed last chemo cycle 07/17/18), bilateral ureteral obstruction scheduled for above procedure 08/04/18 with Dr. Raynelle Bring  Dr. Alinda Money to advise on Xarelto.    Anesthesia records reviewed.  No anesthesia complications noted.   Pt can proceed with planned procedure barring acute status change.  VS: There were no vitals taken for this visit.  PROVIDERS: Wenda Low, MD is PCP   Betsy Coder, MD is Oncologist  LABS:  (all labs ordered are listed, but only abnormal results are displayed)  Labs Reviewed - No data to display   IMAGES: CT Abdomen Pelvis 07/09/18 IMPRESSION: 1. New postoperative changes of diverting transverse colostomy with near complete resolution of previously noted bowel obstruction. There continues to be some mild dilatation of a short segment of mid small bowel (presumably jejunum), suggesting very mild residual partial small bowel obstruction. This may be related to direct involvement of a portion of the jejunum by the colonic mass, as discussed above. 2. Multiple intermediate attenuation lesions throughout the liver, and multiple pulmonary nodules. These remain concerning for metastatic disease. The hepatic lesions could be better characterized with MRI of the abdomen with and without IV gadolinium if clinically appropriate. The pulmonary nodules are highly suspicious, however, the  distribution of nodules is unusual for metastatic disease (which tends to be more mid to lower lung predominant seen in this particular patient). 3. Enlarged low right paratracheal lymph node, as well as enlarged lymph nodes throughout the abdomen and pelvis, as above, concerning for additional metastatic disease. 4. Interval placement of bilateral double-J ureteral stents, with resolution of previously noted hydroureteronephrosis. 5. Aortic atherosclerosis, in addition to left main and 2 vessel coronary artery disease. Please note that although the presence of coronary artery calcium documents the presence of coronary artery disease, the severity of this disease and any potential stenosis cannot be assessed on this non-gated CT examination. Assessment for potential risk factor modification, dietary therapy or pharmacologic therapy may be warranted, if clinically indicated. 6. Additional incidental findings, as above.  EKG:   CV:  Past Medical History:  Diagnosis Date  . Anemia   . Cancer ([place])    colon  . Right leg DVT [facility])     Past Surgical History:  Procedure Laterality Date  . COLOSTOMY N/A 04/17/2018   Procedure: COLOSTOMY;  Surgeon: [name], MD;  Location: [facility];  Service: General;  Laterality: N/A;  . CYSTOSCOPY W/ URETERAL STENT PLACEMENT Bilateral 04/15/2018   Procedure: CYSTOSCOPY BILATERAL WITH RETROGRADE PYELOGRAM/URETERAL BILATERAL STENT PLACEMENT WITH TRANSURETHRAL RESECTION OF BLADDER TUMOR;  Surgeon: Raynelle Bring, MD;  Location: [facility];  Service: Urology;  Laterality: Bilateral;  . DENTAL SURGERY  2010   permanent full mounth dental implants  . INGUINAL HERNIA REPAIR Right 06/23/2018   Procedure: RIGHT HERNIA REPAIR INGUINAL WITH MESH;  Surgeon: [name], MD;  Location: [facility];  Service: General;  Laterality: Right;  . IR IMAGING GUIDED  PORT INSERTION  04/21/2018  . LAPAROTOMY N/A 04/17/2018   Procedure: EXPLORATORY LAPAROTOMY;  Surgeon:  [name], MD;  Location: [facility];  Service: General;  Laterality: N/A;    MEDICATIONS: No current facility-administered medications for this encounter.    Marland Kitchen acetaminophen (TYLENOL) 500 MG tablet  . lidocaine-prilocaine (EMLA) cream  . prochlorperazine (COMPAZINE) 10 MG tablet  . traMADol (ULTRAM) 50 MG tablet  . [START ON 08/07/2018] rivaroxaban (XARELTO) 20 MG TABS tablet  . Rivaroxaban 15 & 20 MG TBPK    Maia Plan [facility] Pre-Surgical Testing [phone] 08/01/18 1:08 PM

## 2018-08-01 NOTE — H&P (Signed)
CC/HPI: Bilateral ureteral obstruction   [name] returns today after having undergone bilateral ureteral stent placement during acute hospitalization over new years. He was diagnosed with metastatic colon cancer and has been receiving systemic chemotherapy. He has also undergone a diverting colostomy. He has tolerated chemotherapy very well thus far. His most recent serum creatinine at further decreased to 1.58 in late February. He has tolerated his stents very well and denies any significant lower urinary tract symptoms or hematuria.     ALLERGIES: None   MEDICATIONS: None   GU PSH: Cystoscopy Insert Stent, Bilateral - 04/15/2018 Cystoscopy TURBT 2-5 cm - 04/15/2018    NON-GU PSH: Colostomy Dental Surgery Procedure    GU PMH: None     PMH Notes:   1) Bilateral ureteral obstruction: He presented in December 2019 with renal failure and bilateral hydronephrosis and a bladder mass. TURBT revealed adenocarcinoma from the colon. Bilateral ureteral stents were placed and his Cr stabilized at 2.0.   Dec 2019: Bilateral ureteral stent placement   NON-GU PMH: None   FAMILY HISTORY: Congestive Heart Failure - Father Lung Cancer - Mother   SOCIAL HISTORY: Marital Status: Married Preferred Language: English; Ethnicity: Not Hispanic Or Latino; Race: Black or African American Current Smoking Status: Patient does not smoke anymore. Has not smoked since 06/15/1986.   Tobacco Use Assessment Completed: Used Tobacco in last 30 days? Does not use smokeless tobacco. Does drink.  Does not use drugs. Drinks 1 caffeinated drink per day.    REVIEW OF SYSTEMS:    GU Review Male:   Patient reports get up at night to urinate. Patient denies frequent urination, hard to postpone urination, burning/ pain with urination, leakage of urine, stream starts and stops, trouble starting your streams, and have to strain to urinate .  Gastrointestinal (Lower):   Patient denies diarrhea and constipation.   Gastrointestinal (Upper):   Patient denies nausea and vomiting.  Constitutional:   Patient denies weight loss, fever, fatigue, and night sweats.  Skin:   Patient denies skin rash/ lesion and itching.  Eyes:   Patient denies blurred vision and double vision.  Ears/ Nose/ Throat:   Patient denies sore throat and sinus problems.  Hematologic/Lymphatic:   Patient denies swollen glands and easy bruising.  Cardiovascular:   Patient denies leg swelling and chest pains.  Respiratory:   Patient denies cough and shortness of breath.  Endocrine:   Patient denies excessive thirst.  Musculoskeletal:   Patient denies back pain and joint pain.  Neurological:   Patient denies headaches and dizziness.  Psychologic:   Patient denies depression and anxiety.   VITAL SIGNS:     Weight 131.6 lb / 59.69 kg  Height 68 in / 172.72 cm  BMI 20.0 kg/m   MULTI-SYSTEM PHYSICAL EXAMINATION:    Constitutional: Well-nourished. No physical deformities. Normally developed. Good grooming.  Respiratory: No labored breathing, no use of accessory muscles. Clear bilaterally.  Cardiovascular: Normal temperature, normal extremity pulses, no swelling, no varicosities. Regular rate and rhythm.      ASSESSMENT:   1 GU:   Ureteral obstruction - N13.1    PLAN:        1. Bilateral ureteral obstruction:  I have communicated with Dr. Benay Gamble and he is scheduled to receive his 5th cycle of chemotherapy in the near future after which he will undergo imaging. I do think it would be prudent to continue with stent management at this time with plans to potentially consider sequential removal of  his stents if he has a good response to chemotherapy. He will be scheduled for cystoscopy and bilateral ureteral stent change in the near future in between chemotherapy cycles.

## 2018-08-01 NOTE — Progress Notes (Signed)
Patient aware to arrive at 0915am on 08/04/18 at [facility] for surgery. Surgery at 1113am.

## 2018-08-02 ENCOUNTER — Other Ambulatory Visit: Payer: Self-pay

## 2018-08-02 ENCOUNTER — Inpatient Hospital Stay: Payer: PRIVATE HEALTH INSURANCE

## 2018-08-02 VITALS — BP 131/68 | HR 68 | Temp 99.1°F | Resp 18

## 2018-08-02 DIAGNOSIS — C187 Malignant neoplasm of sigmoid colon: Secondary | ICD-10-CM

## 2018-08-02 DIAGNOSIS — C772 Secondary and unspecified malignant neoplasm of intra-abdominal lymph nodes: Principal | ICD-10-CM

## 2018-08-02 DIAGNOSIS — Z5111 Encounter for antineoplastic chemotherapy: Secondary | ICD-10-CM | POA: Diagnosis not present

## 2018-08-02 MED ORDER — SODIUM CHLORIDE 0.9% FLUSH
10.0000 mL | INTRAVENOUS | Status: DC | PRN
  Administered 2018-08-02: 10 mL
  Filled 2018-08-02: qty 10

## 2018-08-02 MED ORDER — HEPARIN SOD (PORK) LOCK FLUSH 100 UNIT/ML IV SOLN
500.0000 [IU] | Freq: Once | INTRAVENOUS | Status: AC | PRN
  Administered 2018-08-02: 500 [IU]
  Filled 2018-08-02: qty 5

## 2018-08-02 NOTE — Patient Instructions (Signed)

## 2018-08-03 NOTE — Anesthesia Preprocedure Evaluation (Addendum)
Anesthesia Evaluation  Patient identified by MRN, date of birth, ID band Patient awake    Reviewed: Allergy & Precautions, NPO status , Patient's Chart, lab work & pertinent test results  Airway Mallampati: II  TM Distance: >3 FB Neck ROM: Full    Dental no notable dental hx. (+) Teeth Intact, Dental Advisory Given   Pulmonary former smoker,    Pulmonary exam normal breath sounds clear to auscultation       Cardiovascular + DVT  Normal cardiovascular exam Rhythm:Regular Rate:Normal     Neuro/Psych negative neurological ROS  negative psych ROS   GI/Hepatic negative GI ROS, Neg liver ROS,   Endo/Other  negative endocrine ROS  Renal/GU Cr 1.62 K+ 3.8     Musculoskeletal negative musculoskeletal ROS (+)   Abdominal   Peds  Hematology Hgb 8.2 Plt 168   Anesthesia Other Findings Pt on Xarelto  Reproductive/Obstetrics                            Anesthesia Physical Anesthesia Plan  ASA: II  Anesthesia Plan: General   Post-op Pain Management:    Induction: Intravenous  PONV Risk Score and Plan: 3 and Treatment may vary due to age or medical condition, Ondansetron and Dexamethasone  Airway Management Planned: LMA  Additional Equipment:   Intra-op Plan:   Post-operative Plan:   Informed Consent: I have reviewed the patients History and Physical, chart, labs and discussed the procedure including the risks, benefits and alternatives for the proposed anesthesia with the patient or authorized representative who has indicated his/her understanding and acceptance.     Dental advisory given  Plan Discussed with: CRNA  Anesthesia Plan Comments: (GA)       Anesthesia Quick Evaluation

## 2018-08-04 ENCOUNTER — Other Ambulatory Visit: Payer: Self-pay

## 2018-08-04 ENCOUNTER — Ambulatory Visit (HOSPITAL_COMMUNITY)
Admission: RE | Admit: 2018-08-04 | Discharge: 2018-08-04 | Disposition: A | Discharge status: Home / Self Care | Payer: PRIVATE HEALTH INSURANCE | Attending: Urology | Admitting: Urology

## 2018-08-04 ENCOUNTER — Ambulatory Visit (HOSPITAL_COMMUNITY): Payer: PRIVATE HEALTH INSURANCE

## 2018-08-04 ENCOUNTER — Encounter (HOSPITAL_COMMUNITY)
Admission: RE | Disposition: A | Discharge status: Home / Self Care | Payer: Self-pay | Source: Home / Self Care | Attending: Urology

## 2018-08-04 ENCOUNTER — Ambulatory Visit (HOSPITAL_COMMUNITY): Payer: PRIVATE HEALTH INSURANCE | Admitting: Physician Assistant

## 2018-08-04 ENCOUNTER — Encounter (HOSPITAL_COMMUNITY): Payer: Self-pay | Admitting: *Deleted

## 2018-08-04 DIAGNOSIS — C785 Secondary malignant neoplasm of large intestine and rectum: Secondary | ICD-10-CM | POA: Diagnosis not present

## 2018-08-04 DIAGNOSIS — N135 Crossing vessel and stricture of ureter without hydronephrosis: Secondary | ICD-10-CM | POA: Diagnosis not present

## 2018-08-04 DIAGNOSIS — N289 Disorder of kidney and ureter, unspecified: Secondary | ICD-10-CM | POA: Insufficient documentation

## 2018-08-04 DIAGNOSIS — Z7901 Long term (current) use of anticoagulants: Secondary | ICD-10-CM | POA: Diagnosis not present

## 2018-08-04 DIAGNOSIS — Z79891 Long term (current) use of opiate analgesic: Secondary | ICD-10-CM | POA: Diagnosis not present

## 2018-08-04 DIAGNOSIS — Z933 Colostomy status: Secondary | ICD-10-CM | POA: Diagnosis not present

## 2018-08-04 DIAGNOSIS — I82401 Acute embolism and thrombosis of unspecified deep veins of right lower extremity: Secondary | ICD-10-CM | POA: Diagnosis not present

## 2018-08-04 DIAGNOSIS — Z87891 Personal history of nicotine dependence: Secondary | ICD-10-CM | POA: Diagnosis not present

## 2018-08-04 HISTORY — PX: CYSTOSCOPY W/ URETERAL STENT PLACEMENT: SHX1429

## 2018-08-04 HISTORY — DX: Acute embolism and thrombosis of unspecified deep veins of right lower extremity: I82.401

## 2018-08-04 SURGERY — CYSTOSCOPY, FLEXIBLE, WITH STENT REPLACEMENT
Anesthesia: General | Site: Ureter | Laterality: Bilateral

## 2018-08-04 MED ORDER — LACTATED RINGERS IV SOLN
INTRAVENOUS | Status: DC
  Administered 2018-08-04: 10:00:00 via INTRAVENOUS

## 2018-08-04 MED ORDER — DEXAMETHASONE SODIUM PHOSPHATE 10 MG/ML IJ SOLN
INTRAMUSCULAR | Status: AC
  Filled 2018-08-04: qty 1

## 2018-08-04 MED ORDER — ACETAMINOPHEN 500 MG PO TABS
1000.0000 mg | ORAL_TABLET | Freq: Once | ORAL | Status: AC
  Administered 2018-08-04: 1000 mg via ORAL
  Filled 2018-08-04: qty 2

## 2018-08-04 MED ORDER — DEXAMETHASONE SODIUM PHOSPHATE 10 MG/ML IJ SOLN
INTRAMUSCULAR | Status: DC | PRN
  Administered 2018-08-04: 8 mg via INTRAVENOUS

## 2018-08-04 MED ORDER — MIDAZOLAM HCL 2 MG/2ML IJ SOLN
INTRAMUSCULAR | Status: AC
  Filled 2018-08-04: qty 2

## 2018-08-04 MED ORDER — ONDANSETRON HCL 4 MG/2ML IJ SOLN
INTRAMUSCULAR | Status: AC
  Filled 2018-08-04: qty 2

## 2018-08-04 MED ORDER — PHENAZOPYRIDINE HCL 100 MG PO TABS: 100.0000 mg | ORAL_TABLET | Freq: Three times a day (TID) | ORAL | 0 refills | Status: AC | PRN

## 2018-08-04 MED ORDER — EPHEDRINE SULFATE-NACL 50-0.9 MG/10ML-% IV SOSY
PREFILLED_SYRINGE | INTRAVENOUS | Status: DC | PRN
  Administered 2018-08-04 (×3): 10 mg via INTRAVENOUS

## 2018-08-04 MED ORDER — CEFAZOLIN SODIUM-DEXTROSE 2-4 GM/100ML-% IV SOLN
2.0000 g | Freq: Once | INTRAVENOUS | Status: AC
  Administered 2018-08-04: 2 g via INTRAVENOUS
  Filled 2018-08-04: qty 100

## 2018-08-04 MED ORDER — PHENAZOPYRIDINE HCL 100 MG PO TABS: 100.0000 mg | ORAL_TABLET | Freq: Three times a day (TID) | ORAL | 0 refills | Status: DC | PRN

## 2018-08-04 MED ORDER — FENTANYL CITRATE (PF) 100 MCG/2ML IJ SOLN
INTRAMUSCULAR | Status: AC
  Filled 2018-08-04: qty 2

## 2018-08-04 MED ORDER — SUCCINYLCHOLINE CHLORIDE 200 MG/10ML IV SOSY
PREFILLED_SYRINGE | INTRAVENOUS | Status: AC
  Filled 2018-08-04: qty 10

## 2018-08-04 MED ORDER — SUGAMMADEX SODIUM 200 MG/2ML IV SOLN
INTRAVENOUS | Status: AC
  Filled 2018-08-04: qty 2

## 2018-08-04 MED ORDER — PROPOFOL 10 MG/ML IV BOLUS
INTRAVENOUS | Status: AC
  Filled 2018-08-04: qty 40

## 2018-08-04 MED ORDER — FENTANYL CITRATE (PF) 100 MCG/2ML IJ SOLN
INTRAMUSCULAR | Status: DC | PRN
  Administered 2018-08-04 (×2): 25 ug via INTRAVENOUS

## 2018-08-04 MED ORDER — SODIUM CHLORIDE 0.9 % IR SOLN
Status: DC | PRN
  Administered 2018-08-04: 3000 mL

## 2018-08-04 MED ORDER — MIDAZOLAM HCL 2 MG/2ML IJ SOLN
INTRAMUSCULAR | Status: DC | PRN
  Administered 2018-08-04: 2 mg via INTRAVENOUS

## 2018-08-04 MED ORDER — LIDOCAINE 2% (20 MG/ML) 5 ML SYRINGE
INTRAMUSCULAR | Status: DC | PRN
  Administered 2018-08-04: 100 mg via INTRAVENOUS

## 2018-08-04 MED ORDER — PROPOFOL 10 MG/ML IV BOLUS
INTRAVENOUS | Status: DC | PRN
  Administered 2018-08-04: 160 mg via INTRAVENOUS

## 2018-08-04 MED ORDER — LIDOCAINE 2% (20 MG/ML) 5 ML SYRINGE
INTRAMUSCULAR | Status: AC
  Filled 2018-08-04: qty 5

## 2018-08-04 MED ORDER — ONDANSETRON HCL 4 MG/2ML IJ SOLN
INTRAMUSCULAR | Status: DC | PRN
  Administered 2018-08-04: 4 mg via INTRAVENOUS

## 2018-08-04 MED ORDER — ROCURONIUM BROMIDE 10 MG/ML (PF) SYRINGE
PREFILLED_SYRINGE | INTRAVENOUS | Status: AC
  Filled 2018-08-04: qty 10

## 2018-08-04 SURGICAL SUPPLY — 14 items
BAG URO CATCHER STRL LF (MISCELLANEOUS) ×3 IMPLANT
CATH INTERMIT  6FR 70CM (CATHETERS) ×3 IMPLANT
CLOTH BEACON ORANGE TIMEOUT ST (SAFETY) ×3 IMPLANT
COVER WAND RF STERILE (DRAPES) IMPLANT
GLOVE BIOGEL M STRL SZ7.5 (GLOVE) ×3 IMPLANT
GOWN STRL REUS W/TWL LRG LVL3 (GOWN DISPOSABLE) ×6 IMPLANT
GUIDEWIRE STR DUAL SENSOR (WIRE) ×3 IMPLANT
KIT TURNOVER KIT A (KITS) IMPLANT
MANIFOLD NEPTUNE II (INSTRUMENTS) ×3 IMPLANT
PACK CYSTO (CUSTOM PROCEDURE TRAY) ×3 IMPLANT
STENT URET 6FRX24 CONTOUR (STENTS) ×6 IMPLANT
TUBING CONNECTING 10 (TUBING) ×2 IMPLANT
TUBING CONNECTING 10' (TUBING) ×1
TUBING UROLOGY SET (TUBING) IMPLANT

## 2018-08-04 NOTE — Transfer of Care (Signed)
Immediate Anesthesia Transfer of Care Note  Patient: Miguel Gamble  Procedure(s) Performed: CYSTOSCOPY WITH BILATERAL STENT EXCHANGE (Bilateral Ureter)  Patient Location: PACU  Anesthesia Type:General  Level of Consciousness: awake, alert  and oriented  Airway & Oxygen Therapy: Patient Spontanous Breathing and Patient connected to face mask oxygen  Post-op Assessment: Report given to RN and Post -op Vital signs reviewed and stable  Post vital signs: Reviewed and stable  Last Vitals:  Vitals Value Taken Time  BP 143/82 08/04/2018 11:01 AM  Temp    Pulse 73 08/04/2018 11:02 AM  Resp 21 08/04/2018 11:02 AM  SpO2 100 % 08/04/2018 11:02 AM  Vitals shown include unvalidated device data.  Last Pain:  Vitals:   08/04/18 0954  TempSrc:   PainSc: 0-No pain         Complications: No apparent anesthesia complications

## 2018-08-04 NOTE — Op Note (Signed)
Preoperative diagnosis:  1. Bilateral ureteral obstruction 2. Metastatic colon cancer   Postoperative diagnosis:  1. Bilateral ureteral obstruction 2. Metastatic colon cancer   Procedure:  1. Cystoscopy 2. Bilateral ureteral stent placement (6 x 24 - no string)  Surgeon: Roxy Horseman, Brooke Bonito. M.D.  Anesthesia: General  Complications: None  Intraoperative findings: His stents were minimally encrusted.  EBL: Minimal  Specimens: None  Indication: Miguel Gamble is a 68 y.o. patient with bilateral ureteral obstruction. After reviewing the management options for treatment, he elected to proceed with the above surgical procedure(s). We have discussed the potential benefits and risks of the procedure, side effects of the proposed treatment, the likelihood of the patient achieving the goals of the procedure, and any potential problems that might occur during the procedure or recuperation. Informed consent has been obtained.  Description of procedure:  The patient was taken to the operating room and general anesthesia was induced.  The patient was placed in the dorsal lithotomy position, prepped and draped in the usual sterile fashion, and preoperative antibiotics were administered. A preoperative time-out was performed.   Cystourethroscopy was performed.  The patient's urethra was examined and was unremarkable. The bladder was then systematically examined in its entirety. There was no evidence for any bladder tumors, stones, or other mucosal pathology.    Attention then turned to the left ureteral orifice and the patient's indwelling ureteral stent was identified and brought out to the urethral meatus with the flexible graspers.  A 0.38 sensor guidewire was then advanced up the left ureter into the renal pelvis under fluoroscopic guidance.  The wire was then backloaded through the cystoscope and a ureteral stent was advance over the wire using Seldinger technique.  The stent was  positioned appropriately under fluoroscopic and cystoscopic guidance.  The wire was then removed with an adequate stent curl noted in the renal pelvis as well as in the bladder.  Attention then turned to the right ureteral orifice and the patient's indwelling ureteral stent was identified and brought out to the urethral meatus with the flexible graspers.  A 0.38 sensor guidewire was then advanced up the right ureter into the renal pelvis under fluoroscopic guidance.  The wire was then backloaded through the cystoscope and a ureteral stent was advance over the wire using Seldinger technique.  The stent was positioned appropriately under fluoroscopic and cystoscopic guidance.  The wire was then removed with an adequate stent curl noted in the renal pelvis as well as in the bladder.  The bladder was then emptied and the procedure ended.  The patient appeared to tolerate the procedure well and without complications.  The patient was able to be awakened and transferred to the recovery unit in satisfactory condition.    Pryor Curia MD

## 2018-08-04 NOTE — Anesthesia Postprocedure Evaluation (Signed)
Anesthesia Post Note  Patient: Miguel Gamble  Procedure(s) Performed: CYSTOSCOPY WITH BILATERAL STENT EXCHANGE (Bilateral Ureter)     Patient location during evaluation: PACU Anesthesia Type: General Level of consciousness: awake and alert Pain management: pain level controlled Vital Signs Assessment: post-procedure vital signs reviewed and stable Respiratory status: spontaneous breathing, nonlabored ventilation, respiratory function stable and patient connected to nasal cannula oxygen Cardiovascular status: blood pressure returned to baseline and stable Postop Assessment: no apparent nausea or vomiting Anesthetic complications: no    Last Vitals:  Vitals:   08/04/18 1130 08/04/18 1145  BP: (!) 152/112 (!) 147/81  Pulse: 77 67  Resp: (!) 24 17  Temp:  36.4 C  SpO2: 100% 97%    Last Pain:  Vitals:   08/04/18 1145  TempSrc:   PainSc: 0-No pain                 Barnet Glasgow

## 2018-08-04 NOTE — Anesthesia Procedure Notes (Signed)
Procedure Name: LMA Insertion Date/Time: 08/04/2018 10:22 AM Performed by: Eben Burow, CRNA Pre-anesthesia Checklist: Patient identified, Emergency Drugs available, Suction available, Patient being monitored and Timeout performed Patient Re-evaluated:Patient Re-evaluated prior to induction Oxygen Delivery Method: Circle system utilized Preoxygenation: Pre-oxygenation with 100% oxygen Induction Type: IV induction LMA: LMA with gastric port inserted LMA Size: 4.0 Tube secured with: Tape Dental Injury: Teeth and Oropharynx as per pre-operative assessment

## 2018-08-04 NOTE — Discharge Instructions (Addendum)

## 2018-08-05 ENCOUNTER — Encounter (HOSPITAL_COMMUNITY): Payer: Self-pay | Admitting: Urology

## 2018-08-13 ENCOUNTER — Ambulatory Visit: Payer: PRIVATE HEALTH INSURANCE | Admitting: Oncology

## 2018-08-13 ENCOUNTER — Other Ambulatory Visit: Payer: PRIVATE HEALTH INSURANCE

## 2018-08-14 ENCOUNTER — Inpatient Hospital Stay: Payer: PRIVATE HEALTH INSURANCE | Admitting: Oncology

## 2018-08-14 ENCOUNTER — Telehealth: Payer: Self-pay | Admitting: Oncology

## 2018-08-14 ENCOUNTER — Inpatient Hospital Stay: Payer: PRIVATE HEALTH INSURANCE

## 2018-08-14 ENCOUNTER — Other Ambulatory Visit: Payer: Self-pay

## 2018-08-14 VITALS — BP 137/76 | HR 73 | Temp 98.4°F | Resp 17 | Ht 68.0 in | Wt 140.9 lb

## 2018-08-14 DIAGNOSIS — Z5111 Encounter for antineoplastic chemotherapy: Secondary | ICD-10-CM | POA: Diagnosis not present

## 2018-08-14 DIAGNOSIS — N138 Other obstructive and reflux uropathy: Secondary | ICD-10-CM

## 2018-08-14 DIAGNOSIS — C187 Malignant neoplasm of sigmoid colon: Secondary | ICD-10-CM

## 2018-08-14 DIAGNOSIS — R630 Anorexia: Secondary | ICD-10-CM

## 2018-08-14 DIAGNOSIS — R634 Abnormal weight loss: Secondary | ICD-10-CM

## 2018-08-14 DIAGNOSIS — C772 Secondary and unspecified malignant neoplasm of intra-abdominal lymph nodes: Principal | ICD-10-CM

## 2018-08-14 DIAGNOSIS — N19 Unspecified kidney failure: Secondary | ICD-10-CM | POA: Diagnosis not present

## 2018-08-14 DIAGNOSIS — C787 Secondary malignant neoplasm of liver and intrahepatic bile duct: Secondary | ICD-10-CM

## 2018-08-14 DIAGNOSIS — Z7901 Long term (current) use of anticoagulants: Secondary | ICD-10-CM

## 2018-08-14 DIAGNOSIS — Z95828 Presence of other vascular implants and grafts: Secondary | ICD-10-CM

## 2018-08-14 DIAGNOSIS — R590 Localized enlarged lymph nodes: Secondary | ICD-10-CM

## 2018-08-14 DIAGNOSIS — Z86718 Personal history of other venous thrombosis and embolism: Secondary | ICD-10-CM

## 2018-08-14 DIAGNOSIS — D500 Iron deficiency anemia secondary to blood loss (chronic): Secondary | ICD-10-CM

## 2018-08-14 DIAGNOSIS — K56609 Unspecified intestinal obstruction, unspecified as to partial versus complete obstruction: Secondary | ICD-10-CM

## 2018-08-14 DIAGNOSIS — K4090 Unilateral inguinal hernia, without obstruction or gangrene, not specified as recurrent: Secondary | ICD-10-CM

## 2018-08-14 DIAGNOSIS — R918 Other nonspecific abnormal finding of lung field: Secondary | ICD-10-CM

## 2018-08-14 DIAGNOSIS — Z933 Colostomy status: Secondary | ICD-10-CM

## 2018-08-14 LAB — CBC WITH DIFFERENTIAL (CANCER CENTER ONLY)
Abs Immature Granulocytes: 0.01 10*3/uL (ref 0.00–0.07)
Basophils Absolute: 0 10*3/uL (ref 0.0–0.1)
Basophils Relative: 1 %
Eosinophils Absolute: 0.2 10*3/uL (ref 0.0–0.5)
Eosinophils Relative: 4 %
HCT: 24.9 % — ABNORMAL LOW (ref 39.0–52.0)
Hemoglobin: 7.7 g/dL — ABNORMAL LOW (ref 13.0–17.0)
Immature Granulocytes: 0 %
Lymphocytes Relative: 21 %
Lymphs Abs: 0.9 10*3/uL (ref 0.7–4.0)
MCH: 28.3 pg (ref 26.0–34.0)
MCHC: 30.9 g/dL (ref 30.0–36.0)
MCV: 91.5 fL (ref 80.0–100.0)
Monocytes Absolute: 0.6 10*3/uL (ref 0.1–1.0)
Monocytes Relative: 14 %
Neutro Abs: 2.5 10*3/uL (ref 1.7–7.7)
Neutrophils Relative %: 60 %
Platelet Count: 134 10*3/uL — ABNORMAL LOW (ref 150–400)
RBC: 2.72 MIL/uL — ABNORMAL LOW (ref 4.22–5.81)
RDW: 18.3 % — ABNORMAL HIGH (ref 11.5–15.5)
WBC Count: 4.2 10*3/uL (ref 4.0–10.5)
nRBC: 0 % (ref 0.0–0.2)

## 2018-08-14 LAB — CMP (CANCER CENTER ONLY)
ALT: 14 U/L (ref 0–44)
AST: 23 U/L (ref 15–41)
Albumin: 3.1 g/dL — ABNORMAL LOW (ref 3.5–5.0)
Alkaline Phosphatase: 78 U/L (ref 38–126)
Anion gap: 10 (ref 5–15)
BUN: 15 mg/dL (ref 8–23)
CO2: 21 mmol/L — ABNORMAL LOW (ref 22–32)
Calcium: 8.2 mg/dL — ABNORMAL LOW (ref 8.9–10.3)
Chloride: 113 mmol/L — ABNORMAL HIGH (ref 98–111)
Creatinine: 1.48 mg/dL — ABNORMAL HIGH (ref 0.61–1.24)
GFR, Est AFR Am: 55 mL/min — ABNORMAL LOW (ref >60)
GFR, Estimated: 48 mL/min — ABNORMAL LOW (ref >60)
Glucose, Bld: 108 mg/dL — ABNORMAL HIGH (ref 70–99)
Potassium: 3.7 mmol/L (ref 3.5–5.1)
Sodium: 144 mmol/L (ref 135–145)
Total Bilirubin: 0.4 mg/dL (ref 0.3–1.2)
Total Protein: 6.3 g/dL — ABNORMAL LOW (ref 6.5–8.1)

## 2018-08-14 MED ORDER — DEXTROSE 5 % IV SOLN
Freq: Once | INTRAVENOUS | Status: AC
  Administered 2018-08-14: 13:00:00 via INTRAVENOUS
  Filled 2018-08-14: qty 250

## 2018-08-14 MED ORDER — DEXAMETHASONE SODIUM PHOSPHATE 10 MG/ML IJ SOLN
10.0000 mg | Freq: Once | INTRAMUSCULAR | Status: AC
  Administered 2018-08-14: 10 mg via INTRAVENOUS

## 2018-08-14 MED ORDER — FLUOROURACIL CHEMO INJECTION 2.5 GM/50ML
400.0000 mg/m2 | Freq: Once | INTRAVENOUS | Status: AC
  Administered 2018-08-14: 17:00:00 650 mg via INTRAVENOUS
  Filled 2018-08-14: qty 13

## 2018-08-14 MED ORDER — SODIUM CHLORIDE 0.9% FLUSH
10.0000 mL | INTRAVENOUS | Status: DC | PRN
  Administered 2018-08-14: 10:00:00 10 mL
  Filled 2018-08-14: qty 10

## 2018-08-14 MED ORDER — DEXAMETHASONE SODIUM PHOSPHATE 10 MG/ML IJ SOLN
INTRAMUSCULAR | Status: AC
  Filled 2018-08-14: qty 1

## 2018-08-14 MED ORDER — PALONOSETRON HCL INJECTION 0.25 MG/5ML
0.2500 mg | Freq: Once | INTRAVENOUS | Status: AC
  Administered 2018-08-14: 13:00:00 0.25 mg via INTRAVENOUS

## 2018-08-14 MED ORDER — PALONOSETRON HCL INJECTION 0.25 MG/5ML
INTRAVENOUS | Status: AC
  Filled 2018-08-14: qty 5

## 2018-08-14 MED ORDER — SODIUM CHLORIDE 0.9 % IV SOLN
2400.0000 mg/m2 | INTRAVENOUS | Status: DC
  Administered 2018-08-14: 3900 mg via INTRAVENOUS
  Filled 2018-08-14: qty 78

## 2018-08-14 MED ORDER — OXALIPLATIN CHEMO INJECTION 100 MG/20ML
70.0000 mg/m2 | Freq: Once | INTRAVENOUS | Status: AC
  Administered 2018-08-14: 115 mg via INTRAVENOUS
  Filled 2018-08-14: qty 20

## 2018-08-14 MED ORDER — LEUCOVORIN CALCIUM INJECTION 350 MG
400.0000 mg/m2 | Freq: Once | INTRAVENOUS | Status: AC
  Administered 2018-08-14: 652 mg via INTRAVENOUS
  Filled 2018-08-14: qty 32.6

## 2018-08-14 NOTE — Progress Notes (Signed)
[place] OFFICE PROGRESS NOTE   Diagnosis: Colon cancer  INTERVAL HISTORY:   Miguel Gamble returns as scheduled.  He completed another cycle of FOLFOX 07/31/2018.  No nausea/vomiting or mouth sores.  He has cold sensitivity for a few days following chemotherapy.  No neuropathy symptoms at present.  No bleeding.  Right leg swelling has improved.  Objective:  Vital signs in last 24 hours:  Blood pressure 137/76, pulse 73, temperature 98.4 F (36.9 C), temperature source Oral, resp. rate 17, height 5' 8"  (1.727 m), weight 140 lb 14.4 oz (63.9 kg), SpO2 100 %.     Vascular: The right lower leg is slightly larger than the left side Neuro: The vibratory sense is intact at the fingertips bilaterally Skin: Palms without erythema  Portacath/PICC-without erythema  Lab Results:  Lab Results  Component Value Date   WBC 4.2 08/14/2018   HGB 7.7 (L) 08/14/2018   HCT 24.9 (L) 08/14/2018   MCV 91.5 08/14/2018   PLT 134 (L) 08/14/2018   NEUTROABS 2.5 08/14/2018    CMP  Lab Results  Component Value Date   NA 144 08/14/2018   K 3.7 08/14/2018   CL 113 (H) 08/14/2018   CO2 21 (L) 08/14/2018   GLUCOSE 108 (H) 08/14/2018   BUN 15 08/14/2018   CREATININE 1.48 (H) 08/14/2018   CALCIUM 8.2 (L) 08/14/2018   PROT 6.3 (L) 08/14/2018   ALBUMIN 3.1 (L) 08/14/2018   AST 23 08/14/2018   ALT 14 08/14/2018   ALKPHOS 78 08/14/2018   BILITOT 0.4 08/14/2018   GFRNONAA 48 (L) 08/14/2018   GFRAA 55 (L) 08/14/2018    Lab Results  Component Value Date   CEA1 64.49 (H) 07/31/2018     Medications: I have reviewed the patient's current medications.   Assessment/Plan:  1. Metastatic colon cancer-abdomen/pelvic mass appears to arise from the sigmoid colon  Noncontrast CT 04/14/2018- abdomen/pelvic mass, partial colonic and small bowel obstruction, fistula between the mass and small bowel loops, multiple bladder tumors, left hydronephrosis, bilateral lung metastases,  lymphatic tumor spread in the lungs versus pneumonia, precarinal lymph node, retroperitoneal/mesenteric lymphadenopathy  Resection of multiple bladder masses 04/15/2018-adenocarcinoma consistent with a colonic primary, MSS,tumor mutation burden 10, KRAS G12D, No BRAF mutation  Cycle 1 FOLFOX 05/07/2018  Cycle 2 FOLFOX 05/22/2018  Cycle 3 FOLFOX 06/05/2018 (oxaliplatin dose reduced due to increased creatinine)  Cycle 4 FOLFOX 06/18/2018  Cycle 5 FOLFOX 07/03/2018  CT 07/09/2018- no significant change in lung nodules, resolution of airspace disease, decreased size of liver metastases, near complete resolution of bowel obstruction, new diverting colostomy  Cycle 6 FOLFOX 07/17/2018  Cycle 7 FOLFOX 07/31/2018  Cycle 8 FOLFOX 08/14/2018   2.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 He will follow-up with Dr. Ninfa Linden.  Status post right inguinal hernia repair with mesh 06/23/2018.  6.Anemia, multifactorial secondary to chronic disease and blood loss; transfused 2 units of blood 05/24/2018  7.  Right lower extremity deep vein thrombosis 07/17/2018- started on Xarelto   Disposition: Mr. Stocking appears stable.  He has completed 7 cycles of FOLFOX.  The CEA is lower.  His performance status continues to improve.  He underwent bilateral ureter stent exchange on 08/04/2018.  He will complete another cycle of FOLFOX today.  Mr. Basaldua will return for an office visit and chemotherapy in 2 weeks.  He will let us know if he develops neuropathy symptoms.  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  Betsy Coder, MD  08/14/2018  10:29 AM

## 2018-08-14 NOTE — Telephone Encounter (Signed)
Scheduled appt per 4/30 los. ° ° °

## 2018-08-14 NOTE — Addendum Note (Signed)
Addended by: Betsy Coder B on: 08/14/2018 01:13 PM   Modules accepted: Orders

## 2018-08-14 NOTE — Patient Instructions (Signed)
[facility] Discharge Instructions for Patients Receiving Chemotherapy  Today you received the following chemotherapy agents: Oxaliplatin, Leucovorin, and 5FU.  To help prevent nausea and vomiting after your treatment, we encourage you to take your nausea medication as directed.   If you develop nausea and vomiting that is not controlled by your nausea medication, call the clinic.   BELOW ARE SYMPTOMS THAT SHOULD BE REPORTED IMMEDIATELY:  *FEVER GREATER THAN 100.5 F  *CHILLS WITH OR WITHOUT FEVER  NAUSEA AND VOMITING THAT IS NOT CONTROLLED WITH YOUR NAUSEA MEDICATION  *UNUSUAL SHORTNESS OF BREATH  *UNUSUAL BRUISING OR BLEEDING  TENDERNESS IN MOUTH AND THROAT WITH OR WITHOUT PRESENCE OF ULCERS  *URINARY PROBLEMS  *BOWEL PROBLEMS  UNUSUAL RASH Items with * indicate a potential emergency and should be followed up as soon as possible.  Feel free to call the clinic should you have any questions or concerns. The clinic phone number is [phone].  Please show the CHEMO ALERT CARD at check-in to the Emergency Department and triage nurse.    

## 2018-08-14 NOTE — Telephone Encounter (Signed)
Scheduled appt per 4/30 sch message - unable to reach patient left message for patient with appt date and time

## 2018-08-16 ENCOUNTER — Inpatient Hospital Stay: Payer: PRIVATE HEALTH INSURANCE | Attending: Nurse Practitioner

## 2018-08-16 ENCOUNTER — Other Ambulatory Visit: Payer: Self-pay

## 2018-08-16 VITALS — BP 152/73 | HR 81 | Temp 98.3°F | Resp 18

## 2018-08-16 DIAGNOSIS — Z5111 Encounter for antineoplastic chemotherapy: Secondary | ICD-10-CM | POA: Insufficient documentation

## 2018-08-16 DIAGNOSIS — C772 Secondary and unspecified malignant neoplasm of intra-abdominal lymph nodes: Secondary | ICD-10-CM | POA: Insufficient documentation

## 2018-08-16 DIAGNOSIS — Z86718 Personal history of other venous thrombosis and embolism: Secondary | ICD-10-CM | POA: Insufficient documentation

## 2018-08-16 DIAGNOSIS — C7802 Secondary malignant neoplasm of left lung: Secondary | ICD-10-CM | POA: Insufficient documentation

## 2018-08-16 DIAGNOSIS — N138 Other obstructive and reflux uropathy: Secondary | ICD-10-CM | POA: Insufficient documentation

## 2018-08-16 DIAGNOSIS — R630 Anorexia: Secondary | ICD-10-CM | POA: Insufficient documentation

## 2018-08-16 DIAGNOSIS — K4090 Unilateral inguinal hernia, without obstruction or gangrene, not specified as recurrent: Secondary | ICD-10-CM | POA: Diagnosis not present

## 2018-08-16 DIAGNOSIS — D649 Anemia, unspecified: Secondary | ICD-10-CM | POA: Insufficient documentation

## 2018-08-16 DIAGNOSIS — R634 Abnormal weight loss: Secondary | ICD-10-CM | POA: Insufficient documentation

## 2018-08-16 DIAGNOSIS — C778 Secondary and unspecified malignant neoplasm of lymph nodes of multiple regions: Secondary | ICD-10-CM | POA: Diagnosis not present

## 2018-08-16 DIAGNOSIS — C7801 Secondary malignant neoplasm of right lung: Secondary | ICD-10-CM | POA: Insufficient documentation

## 2018-08-16 DIAGNOSIS — C187 Malignant neoplasm of sigmoid colon: Secondary | ICD-10-CM | POA: Diagnosis not present

## 2018-08-16 MED ORDER — SODIUM CHLORIDE 0.9% FLUSH
10.0000 mL | INTRAVENOUS | Status: DC | PRN
  Administered 2018-08-16: 10 mL
  Filled 2018-08-16: qty 10

## 2018-08-16 MED ORDER — HEPARIN SOD (PORK) LOCK FLUSH 100 UNIT/ML IV SOLN
500.0000 [IU] | Freq: Once | INTRAVENOUS | Status: AC | PRN
  Administered 2018-08-16: 500 [IU]
  Filled 2018-08-16: qty 5

## 2018-08-16 NOTE — Patient Instructions (Signed)

## 2018-08-18 ENCOUNTER — Other Ambulatory Visit: Payer: PRIVATE HEALTH INSURANCE

## 2018-08-25 ENCOUNTER — Encounter: Payer: Self-pay | Admitting: Oncology

## 2018-08-25 ENCOUNTER — Encounter: Payer: Self-pay | Admitting: *Deleted

## 2018-08-25 NOTE — Progress Notes (Signed)
Faxed medical necessity letter dictated by Dr. Benay Spice to [facility] [phone] as requested.

## 2018-08-28 ENCOUNTER — Other Ambulatory Visit: Payer: Self-pay

## 2018-08-28 ENCOUNTER — Inpatient Hospital Stay: Payer: PRIVATE HEALTH INSURANCE

## 2018-08-28 ENCOUNTER — Inpatient Hospital Stay (HOSPITAL_BASED_OUTPATIENT_CLINIC_OR_DEPARTMENT_OTHER): Payer: PRIVATE HEALTH INSURANCE | Admitting: Nurse Practitioner

## 2018-08-28 ENCOUNTER — Encounter: Payer: Self-pay | Admitting: Nurse Practitioner

## 2018-08-28 DIAGNOSIS — Z86718 Personal history of other venous thrombosis and embolism: Secondary | ICD-10-CM

## 2018-08-28 DIAGNOSIS — R630 Anorexia: Secondary | ICD-10-CM

## 2018-08-28 DIAGNOSIS — C187 Malignant neoplasm of sigmoid colon: Secondary | ICD-10-CM

## 2018-08-28 DIAGNOSIS — D649 Anemia, unspecified: Secondary | ICD-10-CM

## 2018-08-28 DIAGNOSIS — C772 Secondary and unspecified malignant neoplasm of intra-abdominal lymph nodes: Secondary | ICD-10-CM | POA: Diagnosis not present

## 2018-08-28 DIAGNOSIS — R634 Abnormal weight loss: Secondary | ICD-10-CM

## 2018-08-28 DIAGNOSIS — C7802 Secondary malignant neoplasm of left lung: Secondary | ICD-10-CM

## 2018-08-28 DIAGNOSIS — Z5111 Encounter for antineoplastic chemotherapy: Secondary | ICD-10-CM | POA: Diagnosis not present

## 2018-08-28 DIAGNOSIS — N138 Other obstructive and reflux uropathy: Secondary | ICD-10-CM

## 2018-08-28 DIAGNOSIS — C7801 Secondary malignant neoplasm of right lung: Secondary | ICD-10-CM

## 2018-08-28 DIAGNOSIS — Z95828 Presence of other vascular implants and grafts: Secondary | ICD-10-CM

## 2018-08-28 DIAGNOSIS — C778 Secondary and unspecified malignant neoplasm of lymph nodes of multiple regions: Secondary | ICD-10-CM

## 2018-08-28 DIAGNOSIS — K4090 Unilateral inguinal hernia, without obstruction or gangrene, not specified as recurrent: Secondary | ICD-10-CM

## 2018-08-28 LAB — CBC WITH DIFFERENTIAL (CANCER CENTER ONLY)
Abs Immature Granulocytes: 0.01 10*3/uL (ref 0.00–0.07)
Basophils Absolute: 0 10*3/uL (ref 0.0–0.1)
Basophils Relative: 1 %
Eosinophils Absolute: 0.1 10*3/uL (ref 0.0–0.5)
Eosinophils Relative: 4 %
HCT: 24.4 % — ABNORMAL LOW (ref 39.0–52.0)
Hemoglobin: 7.4 g/dL — ABNORMAL LOW (ref 13.0–17.0)
Immature Granulocytes: 0 %
Lymphocytes Relative: 28 %
Lymphs Abs: 0.8 10*3/uL (ref 0.7–4.0)
MCH: 27.7 pg (ref 26.0–34.0)
MCHC: 30.3 g/dL (ref 30.0–36.0)
MCV: 91.4 fL (ref 80.0–100.0)
Monocytes Absolute: 0.4 10*3/uL (ref 0.1–1.0)
Monocytes Relative: 15 %
Neutro Abs: 1.5 10*3/uL — ABNORMAL LOW (ref 1.7–7.7)
Neutrophils Relative %: 52 %
Platelet Count: 200 10*3/uL (ref 150–400)
RBC: 2.67 MIL/uL — ABNORMAL LOW (ref 4.22–5.81)
RDW: 18.3 % — ABNORMAL HIGH (ref 11.5–15.5)
WBC Count: 3 10*3/uL — ABNORMAL LOW (ref 4.0–10.5)
nRBC: 0 % (ref 0.0–0.2)

## 2018-08-28 LAB — CMP (CANCER CENTER ONLY)
ALT: 11 U/L (ref 0–44)
AST: 19 U/L (ref 15–41)
Albumin: 3 g/dL — ABNORMAL LOW (ref 3.5–5.0)
Alkaline Phosphatase: 78 U/L (ref 38–126)
Anion gap: 6 (ref 5–15)
BUN: 18 mg/dL (ref 8–23)
CO2: 22 mmol/L (ref 22–32)
Calcium: 8.7 mg/dL — ABNORMAL LOW (ref 8.9–10.3)
Chloride: 115 mmol/L — ABNORMAL HIGH (ref 98–111)
Creatinine: 1.69 mg/dL — ABNORMAL HIGH (ref 0.61–1.24)
GFR, Est AFR Am: 47 mL/min — ABNORMAL LOW (ref >60)
GFR, Estimated: 41 mL/min — ABNORMAL LOW (ref >60)
Glucose, Bld: 111 mg/dL — ABNORMAL HIGH (ref 70–99)
Potassium: 3.9 mmol/L (ref 3.5–5.1)
Sodium: 143 mmol/L (ref 135–145)
Total Bilirubin: 0.3 mg/dL (ref 0.3–1.2)
Total Protein: 6.4 g/dL — ABNORMAL LOW (ref 6.5–8.1)

## 2018-08-28 LAB — CEA (IN HOUSE-CHCC): CEA (CHCC-In House): 41.54 ng/mL — ABNORMAL HIGH (ref 0.00–5.00)

## 2018-08-28 MED ORDER — SODIUM CHLORIDE 0.9 % IV SOLN
2400.0000 mg/m2 | INTRAVENOUS | Status: DC
  Administered 2018-08-28: 15:00:00 3900 mg via INTRAVENOUS
  Filled 2018-08-28: qty 78

## 2018-08-28 MED ORDER — DEXTROSE 5 % IV SOLN
Freq: Once | INTRAVENOUS | Status: AC
  Administered 2018-08-28: 12:00:00 via INTRAVENOUS
  Filled 2018-08-28: qty 250

## 2018-08-28 MED ORDER — FLUOROURACIL CHEMO INJECTION 2.5 GM/50ML
400.0000 mg/m2 | Freq: Once | INTRAVENOUS | Status: AC
  Administered 2018-08-28: 650 mg via INTRAVENOUS
  Filled 2018-08-28: qty 13

## 2018-08-28 MED ORDER — SODIUM CHLORIDE 0.9% FLUSH
10.0000 mL | INTRAVENOUS | Status: DC | PRN
  Administered 2018-08-28: 10 mL
  Filled 2018-08-28: qty 10

## 2018-08-28 MED ORDER — RIVAROXABAN 20 MG PO TABS: 20.0000 mg | ORAL_TABLET | Freq: Every day | ORAL | 2 refills | Status: AC

## 2018-08-28 MED ORDER — LEUCOVORIN CALCIUM INJECTION 350 MG
400.0000 mg/m2 | Freq: Once | INTRAVENOUS | Status: AC
  Administered 2018-08-28: 13:00:00 652 mg via INTRAVENOUS
  Filled 2018-08-28: qty 32.6

## 2018-08-28 MED ORDER — ONDANSETRON HCL 8 MG PO TABS
ORAL_TABLET | ORAL | Status: AC
  Filled 2018-08-28: qty 1

## 2018-08-28 MED ORDER — ONDANSETRON HCL 8 MG PO TABS
8.0000 mg | ORAL_TABLET | Freq: Once | ORAL | Status: AC
  Administered 2018-08-28: 8 mg via ORAL

## 2018-08-28 NOTE — Progress Notes (Addendum)
[place] OFFICE PROGRESS NOTE   Diagnosis: Colon cancer  INTERVAL HISTORY:   Miguel Gamble returns as scheduled.  He completed cycle 8 FOLFOX on 08/14/2018.  He denies nausea/vomiting.  No mouth sores.  No diarrhea.  Cold sensitivity lasted about 6 days.  No numbness or tingling in the absence of cold exposure.  He denies bleeding.  No shortness of breath.  He reports a good energy level.  Objective:  Vital signs in last 24 hours:  Blood pressure (!) 141/75, pulse 83, temperature 98.5 F (36.9 C), temperature source Oral, resp. rate 18, height _0  (1.727 m), weight 141 lb 1.6 oz (64 kg), SpO2 100 %.    HEENT: No thrush or ulcers. Vascular: No leg edema.  Right lower leg is slightly larger than the left lower leg. Neuro: Vibratory sense intact over the fingertips per tuning fork exam. Skin: Palms without erythema. Port-A-Cath without erythema.   Lab Results:  Lab Results  Component Value Date   WBC 3.0 (L) 08/28/2018   HGB 7.4 (L) 08/28/2018   HCT 24.4 (L) 08/28/2018   MCV 91.4 08/28/2018   PLT 200 08/28/2018   NEUTROABS 1.5 (L) 08/28/2018    Imaging:  No results found.  Medications: I have reviewed the patient's current medications.  Assessment/Plan: 1. Metastatic colon cancer-abdomen/pelvic mass appears to arise from the sigmoid colon  Noncontrast CT 04/14/2018- abdomen/pelvic mass, partial colonic and small bowel obstruction, fistula between the mass and small bowel loops, multiple bladder tumors, left hydronephrosis, bilateral lung metastases, lymphatic tumor spread in the lungs versus pneumonia, precarinal lymph node, retroperitoneal/mesenteric lymphadenopathy  Resection of multiple bladder masses 04/15/2018-adenocarcinoma consistent with a colonic primary, MSS,tumor mutation burden 10, KRAS G12D, No BRAF mutation  Cycle 1 FOLFOX 05/07/2018  Cycle 2 FOLFOX 05/22/2018  Cycle 3 FOLFOX 06/05/2018 (oxaliplatin dose reduced due to increased  creatinine)  Cycle 4 FOLFOX 06/18/2018  Cycle 5 FOLFOX 07/03/2018  CT 07/09/2018- no significant change in lung nodules, resolution ofairspace disease, decreased size of liver metastases, near complete resolution of bowel obstruction, new diverting colostomy  Cycle 6 FOLFOX 07/17/2018  Cycle 7 FOLFOX 07/31/2018  Cycle 8 FOLFOX 08/14/2018  Cycle 9 FOLFOX 08/28/2018 (oxaliplatin held due to mild neutropenia)   2.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 He will follow-up with Dr. Ninfa Linden. Status post right inguinal hernia repair with mesh 06/23/2018.  6.Anemia, multifactorial secondary to chronic disease and blood loss; transfused 2 units of blood 05/24/2018  7.Right lower extremity deep vein thrombosis 07/17/2018- started on Xarelto   Disposition: [name] appears stable.  He has completed 8 cycles of FOLFOX.  Plan to proceed with cycle 9 today as scheduled.  Oxaliplatin will be held due to mild neutropenia.  He has persistent anemia, mildly progressive.  He appears asymptomatic.  He will return for lab, follow-up and cycle 10 FOLFOX in 2 weeks.  He will contact the office in the interim with any problems.  Patient seen with Dr. Benay Spice.    Miguel Gamble ANP/GNP-BC   08/28/2018  11:08 AM This was a shared visit with Miguel Gamble.  The hemoglobin is lower, but he appears asymptomatic from anemia.  The neutrophil count is low.  We decided to hold oxalic platinum with this cycle.  He will complete a cycle of 5-fluorouracil today.  We adjusted the antiemetic regimen.  Miguel Manson, MD

## 2018-08-29 ENCOUNTER — Telehealth: Payer: Self-pay | Admitting: Nurse Practitioner

## 2018-08-29 NOTE — Telephone Encounter (Signed)
Scheduled appt per 5/14 los.

## 2018-08-30 ENCOUNTER — Other Ambulatory Visit: Payer: Self-pay

## 2018-08-30 ENCOUNTER — Inpatient Hospital Stay: Payer: PRIVATE HEALTH INSURANCE

## 2018-08-30 VITALS — BP 132/72 | HR 82 | Temp 99.2°F | Resp 16

## 2018-08-30 DIAGNOSIS — C187 Malignant neoplasm of sigmoid colon: Secondary | ICD-10-CM

## 2018-08-30 DIAGNOSIS — Z5111 Encounter for antineoplastic chemotherapy: Secondary | ICD-10-CM | POA: Diagnosis not present

## 2018-08-30 MED ORDER — SODIUM CHLORIDE 0.9% FLUSH
10.0000 mL | INTRAVENOUS | Status: DC | PRN
  Administered 2018-08-30: 10 mL
  Filled 2018-08-30: qty 10

## 2018-08-30 MED ORDER — HEPARIN SOD (PORK) LOCK FLUSH 100 UNIT/ML IV SOLN
500.0000 [IU] | Freq: Once | INTRAVENOUS | Status: AC | PRN
  Administered 2018-08-30: 500 [IU]
  Filled 2018-08-30: qty 5

## 2018-08-30 NOTE — Patient Instructions (Signed)

## 2018-09-08 ENCOUNTER — Other Ambulatory Visit: Payer: Self-pay | Admitting: Oncology

## 2018-09-10 ENCOUNTER — Other Ambulatory Visit: Payer: Self-pay

## 2018-09-10 DIAGNOSIS — C187 Malignant neoplasm of sigmoid colon: Secondary | ICD-10-CM

## 2018-09-11 ENCOUNTER — Other Ambulatory Visit: Payer: Self-pay

## 2018-09-11 ENCOUNTER — Inpatient Hospital Stay: Payer: PRIVATE HEALTH INSURANCE | Admitting: Nurse Practitioner

## 2018-09-11 ENCOUNTER — Inpatient Hospital Stay: Payer: PRIVATE HEALTH INSURANCE

## 2018-09-11 ENCOUNTER — Encounter: Payer: Self-pay | Admitting: Nurse Practitioner

## 2018-09-11 VITALS — BP 136/79 | HR 78 | Temp 98.7°F | Resp 16 | Ht 68.0 in | Wt 146.0 lb

## 2018-09-11 DIAGNOSIS — Z5111 Encounter for antineoplastic chemotherapy: Secondary | ICD-10-CM | POA: Diagnosis not present

## 2018-09-11 DIAGNOSIS — C772 Secondary and unspecified malignant neoplasm of intra-abdominal lymph nodes: Secondary | ICD-10-CM | POA: Diagnosis not present

## 2018-09-11 DIAGNOSIS — C187 Malignant neoplasm of sigmoid colon: Secondary | ICD-10-CM

## 2018-09-11 DIAGNOSIS — Z95828 Presence of other vascular implants and grafts: Secondary | ICD-10-CM

## 2018-09-11 LAB — CMP (CANCER CENTER ONLY)
ALT: 12 U/L (ref 0–44)
AST: 20 U/L (ref 15–41)
Albumin: 3 g/dL — ABNORMAL LOW (ref 3.5–5.0)
Alkaline Phosphatase: 76 U/L (ref 38–126)
Anion gap: 6 (ref 5–15)
BUN: 16 mg/dL (ref 8–23)
CO2: 22 mmol/L (ref 22–32)
Calcium: 8.3 mg/dL — ABNORMAL LOW (ref 8.9–10.3)
Chloride: 114 mmol/L — ABNORMAL HIGH (ref 98–111)
Creatinine: 1.6 mg/dL — ABNORMAL HIGH (ref 0.61–1.24)
GFR, Est AFR Am: 50 mL/min — ABNORMAL LOW (ref >60)
GFR, Estimated: 43 mL/min — ABNORMAL LOW (ref >60)
Glucose, Bld: 105 mg/dL — ABNORMAL HIGH (ref 70–99)
Potassium: 3.5 mmol/L (ref 3.5–5.1)
Sodium: 142 mmol/L (ref 135–145)
Total Bilirubin: 0.4 mg/dL (ref 0.3–1.2)
Total Protein: 6.1 g/dL — ABNORMAL LOW (ref 6.5–8.1)

## 2018-09-11 LAB — CBC WITH DIFFERENTIAL (CANCER CENTER ONLY)
Abs Immature Granulocytes: 0.01 10*3/uL (ref 0.00–0.07)
Basophils Absolute: 0 10*3/uL (ref 0.0–0.1)
Basophils Relative: 1 %
Eosinophils Absolute: 0.2 10*3/uL (ref 0.0–0.5)
Eosinophils Relative: 6 %
HCT: 23.5 % — ABNORMAL LOW (ref 39.0–52.0)
Hemoglobin: 7.2 g/dL — ABNORMAL LOW (ref 13.0–17.0)
Immature Granulocytes: 0 %
Lymphocytes Relative: 28 %
Lymphs Abs: 0.9 10*3/uL (ref 0.7–4.0)
MCH: 28.5 pg (ref 26.0–34.0)
MCHC: 30.6 g/dL (ref 30.0–36.0)
MCV: 92.9 fL (ref 80.0–100.0)
Monocytes Absolute: 0.5 10*3/uL (ref 0.1–1.0)
Monocytes Relative: 15 %
Neutro Abs: 1.6 10*3/uL — ABNORMAL LOW (ref 1.7–7.7)
Neutrophils Relative %: 50 %
Platelet Count: 175 10*3/uL (ref 150–400)
RBC: 2.53 MIL/uL — ABNORMAL LOW (ref 4.22–5.81)
RDW: 18.4 % — ABNORMAL HIGH (ref 11.5–15.5)
WBC Count: 3.2 10*3/uL — ABNORMAL LOW (ref 4.0–10.5)
nRBC: 0 % (ref 0.0–0.2)

## 2018-09-11 MED ORDER — DEXAMETHASONE SODIUM PHOSPHATE 10 MG/ML IJ SOLN
10.0000 mg | Freq: Once | INTRAMUSCULAR | Status: AC
  Administered 2018-09-11: 10 mg via INTRAVENOUS

## 2018-09-11 MED ORDER — SODIUM CHLORIDE 0.9% FLUSH
10.0000 mL | INTRAVENOUS | Status: DC | PRN
  Administered 2018-09-11: 10 mL
  Filled 2018-09-11: qty 10

## 2018-09-11 MED ORDER — LEUCOVORIN CALCIUM INJECTION 350 MG
400.0000 mg/m2 | Freq: Once | INTRAVENOUS | Status: AC
  Administered 2018-09-11: 652 mg via INTRAVENOUS
  Filled 2018-09-11: qty 32.6

## 2018-09-11 MED ORDER — FLUOROURACIL CHEMO INJECTION 2.5 GM/50ML
400.0000 mg/m2 | Freq: Once | INTRAVENOUS | Status: AC
  Administered 2018-09-11: 14:00:00 650 mg via INTRAVENOUS
  Filled 2018-09-11: qty 13

## 2018-09-11 MED ORDER — OXALIPLATIN CHEMO INJECTION 100 MG/20ML
70.0000 mg/m2 | Freq: Once | INTRAVENOUS | Status: AC
  Administered 2018-09-11: 12:00:00 115 mg via INTRAVENOUS
  Filled 2018-09-11: qty 20

## 2018-09-11 MED ORDER — PALONOSETRON HCL INJECTION 0.25 MG/5ML
0.2500 mg | Freq: Once | INTRAVENOUS | Status: AC
  Administered 2018-09-11: 11:00:00 0.25 mg via INTRAVENOUS

## 2018-09-11 MED ORDER — DEXTROSE 5 % IV SOLN
Freq: Once | INTRAVENOUS | Status: AC
  Administered 2018-09-11: 11:00:00 via INTRAVENOUS
  Filled 2018-09-11: qty 250

## 2018-09-11 MED ORDER — DEXAMETHASONE SODIUM PHOSPHATE 10 MG/ML IJ SOLN
INTRAMUSCULAR | Status: AC
  Filled 2018-09-11: qty 1

## 2018-09-11 MED ORDER — PALONOSETRON HCL INJECTION 0.25 MG/5ML
INTRAVENOUS | Status: AC
  Filled 2018-09-11: qty 5

## 2018-09-11 MED ORDER — SODIUM CHLORIDE 0.9 % IV SOLN
2400.0000 mg/m2 | INTRAVENOUS | Status: DC
  Administered 2018-09-11: 14:00:00 3900 mg via INTRAVENOUS
  Filled 2018-09-11: qty 78

## 2018-09-11 NOTE — Patient Instructions (Signed)
[facility] Discharge Instructions for Patients Receiving Chemotherapy  Today you received the following chemotherapy agents: Oxaliplatin, Leucovorin, and 5FU.  To help prevent nausea and vomiting after your treatment, we encourage you to take your nausea medication as directed.   If you develop nausea and vomiting that is not controlled by your nausea medication, call the clinic.   BELOW ARE SYMPTOMS THAT SHOULD BE REPORTED IMMEDIATELY:  *FEVER GREATER THAN 100.5 F  *CHILLS WITH OR WITHOUT FEVER  NAUSEA AND VOMITING THAT IS NOT CONTROLLED WITH YOUR NAUSEA MEDICATION  *UNUSUAL SHORTNESS OF BREATH  *UNUSUAL BRUISING OR BLEEDING  TENDERNESS IN MOUTH AND THROAT WITH OR WITHOUT PRESENCE OF ULCERS  *URINARY PROBLEMS  *BOWEL PROBLEMS  UNUSUAL RASH Items with * indicate a potential emergency and should be followed up as soon as possible.  Feel free to call the clinic should you have any questions or concerns. The clinic phone number is [phone].  Please show the CHEMO ALERT CARD at check-in to the Emergency Department and triage nurse.    

## 2018-09-11 NOTE — Progress Notes (Signed)
[place] OFFICE PROGRESS NOTE   Diagnosis: Colon cancer  INTERVAL HISTORY:   Miguel Gamble returns as scheduled.  He completed cycle 9 FOLFOX 08/28/2018.  Oxaliplatin was held due to mild neutropenia.  He denies nausea/vomiting.  No mouth sores.  No diarrhea.  He has intermittent mild tingling in the feet.  No numbness or tingling in the hands.  He denies fever, cough, shortness of breath.  He reports a good energy level.  He does not feel he needs a blood transfusion.  Objective:  Vital signs in last 24 hours:  Blood pressure 136/79, pulse 78, temperature 98.7 F (37.1 C), temperature source Tympanic, resp. rate 16, height 5' 8"  (1.727 m), weight 146 lb (66.2 kg), SpO2 100 %.    GI: Left abdomen colostomy. Vascular: Right lower leg is larger than the left lower leg. Skin: Palms without erythema. Port-A-Cath is without erythema.   Lab Results:  Lab Results  Component Value Date   WBC 3.2 (L) 09/11/2018   HGB 7.2 (L) 09/11/2018   HCT 23.5 (L) 09/11/2018   MCV 92.9 09/11/2018   PLT 175 09/11/2018   NEUTROABS 1.6 (L) 09/11/2018    Imaging:  No results found.  Medications: I have reviewed the patient's current medications.  Assessment/Plan: 1.Metastatic colon cancer-abdomen/pelvic mass appears to arise from the sigmoid colon  Noncontrast CT 04/14/2018- abdomen/pelvic mass, partial colonic and small bowel obstruction, fistula between the mass and small bowel loops, multiple bladder tumors, left hydronephrosis, bilateral lung metastases, lymphatic tumor spread in the lungs versus pneumonia, precarinal lymph node, retroperitoneal/mesenteric lymphadenopathy  Resection of multiple bladder masses 04/15/2018-adenocarcinoma consistent with a colonic primary, MSS,tumor mutation burden 10, KRAS G12D, No BRAF mutation  Cycle 1 FOLFOX 05/07/2018  Cycle 2 FOLFOX 05/22/2018  Cycle 3 FOLFOX 06/05/2018 (oxaliplatin dose reduced due to increased creatinine)  Cycle  4 FOLFOX 06/18/2018  Cycle 5 FOLFOX 07/03/2018  CT 07/09/2018-no significant change in lung nodules, resolution ofairspace disease, decreased size of liver metastases, near complete resolution of bowel obstruction, new diverting colostomy  Cycle 6 FOLFOX 07/17/2018  Cycle 7 FOLFOX 07/31/2018  Cycle 8 FOLFOX 08/14/2018  Cycle 9 FOLFOX 08/28/2018 (oxaliplatin held due to mild neutropenia)   2.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 He will follow-up with Dr. Ninfa Linden. Status post right inguinal hernia repair with mesh 06/23/2018.  6.Anemia, multifactorial secondary to chronic disease and blood loss; transfused 2 units of blood 05/24/2018  7.Right lower extremity deep vein thrombosis 07/17/2018- started on Xarelto   Disposition: Miguel Gamble appears stable.  He has completed 9 cycles of FOLFOX.  Oxaliplatin was held with cycle 9 due to mild neutropenia.  We reviewed the CBC from today.  He continues to have mild neutropenia slightly improved as compared to 2 weeks ago.  Plan to proceed with cycle 10 FOLFOX today as scheduled.  He will receive Udenyca on the day of pump discontinuation.  We reviewed potential toxicities associated with Udenyca including bone pain, rash, splenic rupture.  He agrees to proceed.  He understands to contact the office with fever, chills, other signs of infection.  He will undergo restaging CTs prior to his next visit in 2 weeks.  He has mildly progressive anemia.  He is asymptomatic and declines red cell transfusion support.  Creatinine is stable.  He will return for lab and follow-up  in 2 weeks.  He will contact the office in the interim as outlined above or with any other problems.  Plan reviewed with Dr. Benay Spice.  25  minutes were spent face-to-face at today's visit with the majority of that time involved in counseling/coordination of care.  Ned Card ANP/GNP-BC   09/11/2018  9:58 AM

## 2018-09-11 NOTE — Progress Notes (Signed)
NP, Ned Card reviewed labs today: OK to treat w/Hgb 7.2 and creatinine 1.6 (was 1.69 last tx).

## 2018-09-12 ENCOUNTER — Telehealth: Payer: Self-pay | Admitting: Nurse Practitioner

## 2018-09-12 NOTE — Telephone Encounter (Signed)
Scheduled appt per 5/28 los. °

## 2018-09-13 ENCOUNTER — Other Ambulatory Visit: Payer: Self-pay

## 2018-09-13 ENCOUNTER — Inpatient Hospital Stay: Payer: PRIVATE HEALTH INSURANCE

## 2018-09-13 VITALS — BP 146/69 | HR 78 | Temp 98.6°F | Resp 18

## 2018-09-13 DIAGNOSIS — Z5111 Encounter for antineoplastic chemotherapy: Secondary | ICD-10-CM | POA: Diagnosis not present

## 2018-09-13 DIAGNOSIS — C187 Malignant neoplasm of sigmoid colon: Secondary | ICD-10-CM

## 2018-09-13 MED ORDER — SODIUM CHLORIDE 0.9% FLUSH
10.0000 mL | INTRAVENOUS | Status: DC | PRN
  Administered 2018-09-13: 10 mL
  Filled 2018-09-13: qty 10

## 2018-09-13 MED ORDER — HEPARIN SOD (PORK) LOCK FLUSH 100 UNIT/ML IV SOLN
500.0000 [IU] | Freq: Once | INTRAVENOUS | Status: AC | PRN
  Administered 2018-09-13: 500 [IU]
  Filled 2018-09-13: qty 5

## 2018-09-13 MED ORDER — PEGFILGRASTIM-CBQV 6 MG/0.6ML ~~LOC~~ SOSY
6.0000 mg | PREFILLED_SYRINGE | Freq: Once | SUBCUTANEOUS | Status: AC
  Administered 2018-09-13: 6 mg via SUBCUTANEOUS

## 2018-09-13 NOTE — Patient Instructions (Signed)
Pegfilgrastim injection  What is this medicine?  PEGFILGRASTIM (PEG fil gra stim) is a long-acting granulocyte colony-stimulating factor that stimulates the growth of neutrophils, a type of white blood cell important in the body's fight against infection. It is used to reduce the incidence of fever and infection in patients with certain types of cancer who are receiving chemotherapy that affects the bone marrow, and to increase survival after being exposed to high doses of radiation.  This medicine may be used for other purposes; ask your health care provider or pharmacist if you have questions.  COMMON BRAND NAME(S): Fulphila, Neulasta, UDENYCA  What should I tell my health care provider before I take this medicine?  They need to know if you have any of these conditions:  -kidney disease  -latex allergy  -ongoing radiation therapy  -sickle cell disease  -skin reactions to acrylic adhesives (On-Body Injector only)  -an unusual or allergic reaction to pegfilgrastim, filgrastim, other medicines, foods, dyes, or preservatives  -pregnant or trying to get pregnant  -breast-feeding  How should I use this medicine?  This medicine is for injection under the skin. If you get this medicine at home, you will be taught how to prepare and give the pre-filled syringe or how to use the On-body Injector. Refer to the patient Instructions for Use for detailed instructions. Use exactly as directed. Tell your healthcare provider immediately if you suspect that the On-body Injector may not have performed as intended or if you suspect the use of the On-body Injector resulted in a missed or partial dose.  It is important that you put your used needles and syringes in a special sharps container. Do not put them in a trash can. If you do not have a sharps container, call your pharmacist or healthcare provider to get one.  Talk to your pediatrician regarding the use of this medicine in children. While this drug may be prescribed for  selected conditions, precautions do apply.  Overdosage: If you think you have taken too much of this medicine contact a poison control center or emergency room at once.  NOTE: This medicine is only for you. Do not share this medicine with others.  What if I miss a dose?  It is important not to miss your dose. Call your doctor or health care professional if you miss your dose. If you miss a dose due to an On-body Injector failure or leakage, a new dose should be administered as soon as possible using a single prefilled syringe for manual use.  What may interact with this medicine?  Interactions have not been studied.  Give your health care provider a list of all the medicines, herbs, non-prescription drugs, or dietary supplements you use. Also tell them if you smoke, drink alcohol, or use illegal drugs. Some items may interact with your medicine.  This list may not describe all possible interactions. Give your health care provider a list of all the medicines, herbs, non-prescription drugs, or dietary supplements you use. Also tell them if you smoke, drink alcohol, or use illegal drugs. Some items may interact with your medicine.  What should I watch for while using this medicine?  You may need blood work done while you are taking this medicine.  If you are going to need a MRI, CT scan, or other procedure, tell your doctor that you are using this medicine (On-Body Injector only).  What side effects may I notice from receiving this medicine?  Side effects that you should report to   your doctor or health care professional as soon as possible:  -allergic reactions like skin rash, itching or hives, swelling of the face, lips, or tongue  -back pain  -dizziness  -fever  -pain, redness, or irritation at site where injected  -pinpoint red spots on the skin  -red or dark-brown urine  -shortness of breath or breathing problems  -stomach or side pain, or pain at the shoulder  -swelling  -tiredness  -trouble passing urine or  change in the amount of urine  Side effects that usually do not require medical attention (report to your doctor or health care professional if they continue or are bothersome):  -bone pain  -muscle pain  This list may not describe all possible side effects. Call your doctor for medical advice about side effects. You may report side effects to FDA at 1-800-FDA-1088.  Where should I keep my medicine?  Keep out of the reach of children.  If you are using this medicine at home, you will be instructed on how to store it. Throw away any unused medicine after the expiration date on the label.  NOTE: This sheet is a summary. It may not cover all possible information. If you have questions about this medicine, talk to your doctor, pharmacist, or health care provider.   2019 Elsevier/Gold Standard (2017-07-08 16:57:08)

## 2018-09-21 ENCOUNTER — Other Ambulatory Visit: Payer: Self-pay | Admitting: Oncology

## 2018-09-22 ENCOUNTER — Ambulatory Visit
Admission: RE | Admit: 2018-09-22 | Discharge: 2018-09-22 | Disposition: A | Discharge status: Home / Self Care | Payer: PRIVATE HEALTH INSURANCE | Source: Ambulatory Visit | Attending: Nurse Practitioner | Admitting: Nurse Practitioner

## 2018-09-22 ENCOUNTER — Other Ambulatory Visit: Payer: PRIVATE HEALTH INSURANCE

## 2018-09-22 ENCOUNTER — Other Ambulatory Visit: Payer: Self-pay

## 2018-09-22 DIAGNOSIS — C772 Secondary and unspecified malignant neoplasm of intra-abdominal lymph nodes: Secondary | ICD-10-CM

## 2018-09-22 DIAGNOSIS — C187 Malignant neoplasm of sigmoid colon: Secondary | ICD-10-CM

## 2018-09-25 ENCOUNTER — Inpatient Hospital Stay: Payer: PRIVATE HEALTH INSURANCE

## 2018-09-25 ENCOUNTER — Telehealth: Payer: Self-pay | Admitting: Oncology

## 2018-09-25 ENCOUNTER — Inpatient Hospital Stay (HOSPITAL_BASED_OUTPATIENT_CLINIC_OR_DEPARTMENT_OTHER): Payer: PRIVATE HEALTH INSURANCE | Admitting: Oncology

## 2018-09-25 ENCOUNTER — Inpatient Hospital Stay: Payer: PRIVATE HEALTH INSURANCE | Attending: Nurse Practitioner

## 2018-09-25 ENCOUNTER — Other Ambulatory Visit: Payer: Self-pay

## 2018-09-25 VITALS — BP 144/72 | HR 69 | Temp 98.3°F | Resp 18 | Ht 68.0 in | Wt 146.8 lb

## 2018-09-25 DIAGNOSIS — K4090 Unilateral inguinal hernia, without obstruction or gangrene, not specified as recurrent: Secondary | ICD-10-CM | POA: Diagnosis not present

## 2018-09-25 DIAGNOSIS — N138 Other obstructive and reflux uropathy: Secondary | ICD-10-CM | POA: Insufficient documentation

## 2018-09-25 DIAGNOSIS — Z5111 Encounter for antineoplastic chemotherapy: Secondary | ICD-10-CM

## 2018-09-25 DIAGNOSIS — C772 Secondary and unspecified malignant neoplasm of intra-abdominal lymph nodes: Secondary | ICD-10-CM

## 2018-09-25 DIAGNOSIS — Z7901 Long term (current) use of anticoagulants: Secondary | ICD-10-CM | POA: Diagnosis not present

## 2018-09-25 DIAGNOSIS — C7801 Secondary malignant neoplasm of right lung: Secondary | ICD-10-CM

## 2018-09-25 DIAGNOSIS — R200 Anesthesia of skin: Secondary | ICD-10-CM | POA: Diagnosis not present

## 2018-09-25 DIAGNOSIS — N281 Cyst of kidney, acquired: Secondary | ICD-10-CM

## 2018-09-25 DIAGNOSIS — K56609 Unspecified intestinal obstruction, unspecified as to partial versus complete obstruction: Secondary | ICD-10-CM | POA: Diagnosis not present

## 2018-09-25 DIAGNOSIS — D649 Anemia, unspecified: Secondary | ICD-10-CM | POA: Insufficient documentation

## 2018-09-25 DIAGNOSIS — C7802 Secondary malignant neoplasm of left lung: Secondary | ICD-10-CM | POA: Insufficient documentation

## 2018-09-25 DIAGNOSIS — I700 Atherosclerosis of aorta: Secondary | ICD-10-CM | POA: Insufficient documentation

## 2018-09-25 DIAGNOSIS — Z86718 Personal history of other venous thrombosis and embolism: Secondary | ICD-10-CM | POA: Insufficient documentation

## 2018-09-25 DIAGNOSIS — C187 Malignant neoplasm of sigmoid colon: Secondary | ICD-10-CM | POA: Diagnosis not present

## 2018-09-25 DIAGNOSIS — K449 Diaphragmatic hernia without obstruction or gangrene: Secondary | ICD-10-CM

## 2018-09-25 DIAGNOSIS — Z933 Colostomy status: Secondary | ICD-10-CM | POA: Insufficient documentation

## 2018-09-25 DIAGNOSIS — Z95828 Presence of other vascular implants and grafts: Secondary | ICD-10-CM

## 2018-09-25 LAB — CBC WITH DIFFERENTIAL (CANCER CENTER ONLY)
Abs Immature Granulocytes: 0.08 10*3/uL — ABNORMAL HIGH (ref 0.00–0.07)
Basophils Absolute: 0 10*3/uL (ref 0.0–0.1)
Basophils Relative: 0 %
Eosinophils Absolute: 0.2 10*3/uL (ref 0.0–0.5)
Eosinophils Relative: 2 %
HCT: 25.4 % — ABNORMAL LOW (ref 39.0–52.0)
Hemoglobin: 7.7 g/dL — ABNORMAL LOW (ref 13.0–17.0)
Immature Granulocytes: 1 %
Lymphocytes Relative: 13 %
Lymphs Abs: 1.5 10*3/uL (ref 0.7–4.0)
MCH: 28.3 pg (ref 26.0–34.0)
MCHC: 30.3 g/dL (ref 30.0–36.0)
MCV: 93.4 fL (ref 80.0–100.0)
Monocytes Absolute: 1.2 10*3/uL — ABNORMAL HIGH (ref 0.1–1.0)
Monocytes Relative: 10 %
Neutro Abs: 8.3 10*3/uL — ABNORMAL HIGH (ref 1.7–7.7)
Neutrophils Relative %: 74 %
Platelet Count: 163 10*3/uL (ref 150–400)
RBC: 2.72 MIL/uL — ABNORMAL LOW (ref 4.22–5.81)
RDW: 18.9 % — ABNORMAL HIGH (ref 11.5–15.5)
WBC Count: 11.2 10*3/uL — ABNORMAL HIGH (ref 4.0–10.5)
nRBC: 0 % (ref 0.0–0.2)

## 2018-09-25 LAB — CMP (CANCER CENTER ONLY)
ALT: 12 U/L (ref 0–44)
AST: 19 U/L (ref 15–41)
Albumin: 3.2 g/dL — ABNORMAL LOW (ref 3.5–5.0)
Alkaline Phosphatase: 111 U/L (ref 38–126)
Anion gap: 8 (ref 5–15)
BUN: 13 mg/dL (ref 8–23)
CO2: 21 mmol/L — ABNORMAL LOW (ref 22–32)
Calcium: 8.5 mg/dL — ABNORMAL LOW (ref 8.9–10.3)
Chloride: 112 mmol/L — ABNORMAL HIGH (ref 98–111)
Creatinine: 1.6 mg/dL — ABNORMAL HIGH (ref 0.61–1.24)
GFR, Est AFR Am: 50 mL/min — ABNORMAL LOW (ref >60)
GFR, Estimated: 43 mL/min — ABNORMAL LOW (ref >60)
Glucose, Bld: 92 mg/dL (ref 70–99)
Potassium: 3.9 mmol/L (ref 3.5–5.1)
Sodium: 141 mmol/L (ref 135–145)
Total Bilirubin: 0.3 mg/dL (ref 0.3–1.2)
Total Protein: 6.4 g/dL — ABNORMAL LOW (ref 6.5–8.1)

## 2018-09-25 LAB — CEA (IN HOUSE-CHCC): CEA (CHCC-In House): 56.92 ng/mL — ABNORMAL HIGH (ref 0.00–5.00)

## 2018-09-25 MED ORDER — HEPARIN SOD (PORK) LOCK FLUSH 100 UNIT/ML IV SOLN
500.0000 [IU] | Freq: Once | INTRAVENOUS | Status: AC
  Administered 2018-09-25: 500 [IU] via INTRAVENOUS
  Filled 2018-09-25: qty 5

## 2018-09-25 MED ORDER — SODIUM CHLORIDE 0.9% FLUSH
10.0000 mL | INTRAVENOUS | Status: DC | PRN
  Administered 2018-09-25: 10 mL
  Filled 2018-09-25: qty 10

## 2018-09-25 MED ORDER — SODIUM CHLORIDE 0.9% FLUSH
10.0000 mL | INTRAVENOUS | Status: DC | PRN
  Administered 2018-09-25: 10 mL via INTRAVENOUS
  Filled 2018-09-25: qty 10

## 2018-09-25 NOTE — Telephone Encounter (Signed)
Per 6/11 los, appts were already scheduled. Just had to change the notes from folfox to folfiri.

## 2018-09-25 NOTE — Patient Instructions (Signed)
Provided patient printed handout on irinotecan and instructed him to be sure he has Imodium AD in the home. Return in 2 weeks for FOLFIRI.

## 2018-09-25 NOTE — Progress Notes (Signed)
[place] OFFICE PROGRESS NOTE   Diagnosis: Colon cancer  INTERVAL HISTORY:   Miguel Gamble completed another cycle of FOLFOX on 09/11/2018.  He reports tolerating the chemotherapy well.  He has mild numbness in the toes.  No diarrhea or nausea/vomiting.  The colostomy is functioning well.  Good appetite.  No new complaint.  Objective:  Vital signs in last 24 hours:  Blood pressure (!) 144/72, pulse 69, temperature 98.3 F (36.8 C), temperature source Oral, resp. rate 18, height 5' 8"  (1.727 m), weight 146 lb 12.8 oz (66.6 kg), SpO2 100 %.     Lymphatics: No cervical or supraclavicular nodes GI: No hepatosplenomegaly, no mass, nontender, left lower quadrant colostomy Vascular: Trace ankle edema bilaterally  Skin: Palms without erythema or skin breakdown  Portacath/PICC-without erythema  Lab Results:  Lab Results  Component Value Date   WBC 11.2 (H) 09/25/2018   HGB 7.7 (L) 09/25/2018   HCT 25.4 (L) 09/25/2018   MCV 93.4 09/25/2018   PLT 163 09/25/2018   NEUTROABS 8.3 (H) 09/25/2018    CMP  Lab Results  Component Value Date   NA 141 09/25/2018   K 3.9 09/25/2018   CL 112 (H) 09/25/2018   CO2 21 (L) 09/25/2018   GLUCOSE 92 09/25/2018   BUN 13 09/25/2018   CREATININE 1.60 (H) 09/25/2018   CALCIUM 8.5 (L) 09/25/2018   PROT 6.4 (L) 09/25/2018   ALBUMIN 3.2 (L) 09/25/2018   AST 19 09/25/2018   ALT 12 09/25/2018   ALKPHOS 111 09/25/2018   BILITOT 0.3 09/25/2018   GFRNONAA 43 (L) 09/25/2018   GFRAA 50 (L) 09/25/2018    Lab Results  Component Value Date   CEA1 56.92 (H) 09/25/2018     Imaging:  Ct Abdomen Pelvis Wo Contrast  Result Date: 09/22/2018 CLINICAL DATA:  Sigmoid colon cancer.  Restaging. EXAM: CT CHEST, ABDOMEN AND PELVIS WITHOUT CONTRAST TECHNIQUE: Multidetector CT imaging of the chest, abdomen and pelvis was performed following the standard protocol without IV contrast. COMPARISON:  07/09/2018 FINDINGS: CT CHEST FINDINGS  Cardiovascular: The heart size is normal. No substantial pericardial effusion. Coronary artery calcification is evident. Atherosclerotic calcification is noted in the wall of the thoracic aorta. Right Port-A-Cath tip position in the mid SVC. Mediastinum/Nodes: The 16 mm precarinal lymph node measured previously is stable at 16 mm short axis. No other mediastinal lymphadenopathy evident. No evidence for gross hilar lymphadenopathy although assessment is limited by the lack of intravenous contrast on today's study. There is no axillary lymphadenopathy. The esophagus has normal imaging features. Tiny hiatal hernia noted. Lungs/Pleura: Bilateral pulmonary metastases again noted. *Index right upper lobe nodule measured previously at 18 mm is similar today at 19 mm (24/4). *19 mm left upper lobe nodule (27/4) has increased from 14 mm previously. *9 mm right middle lobe nodule (83/4) was 2 mm previously. *11 mm right lower lobe nodule (107/4) was 4 mm previously. No pleural effusion.  No focal airspace consolidation. Musculoskeletal: No worrisome lytic or sclerotic osseous abnormality. CT ABDOMEN PELVIS FINDINGS Hepatobiliary: Scattered small low-density liver lesions are similar to prior. Dominant lesion in the dome of the liver measures 2.1 cm today compared to 2.5 cm previously. Other smaller liver lesions are similar in the interval. There is no evidence for gallstones, gallbladder wall thickening, or pericholecystic fluid. No intrahepatic or extrahepatic biliary dilation. Pancreas: No focal mass lesion. No dilatation of the main duct. No intraparenchymal cyst. No peripancreatic edema. Spleen: No splenomegaly. No focal mass lesion. Adrenals/Urinary Tract: No  adrenal nodule or mass. Bilateral renal cysts. Bilateral double-J internal ureteral stents are stable in position without evidence for residual hydronephrosis. Bladder wall thickening evident with a 17 mm polypoid lesion along the posterior left bladder wall.  Stomach/Bowel: Stomach is unremarkable. No gastric wall thickening. No evidence of outlet obstruction. Duodenum is normally positioned as is the ligament of Treitz. No small bowel wall thickening. No small bowel dilatation. Diverticular changes are noted in the right colon left abdominal transverse loop colostomy evident. Irregular wall thickening in the sigmoid colon with apparent transmural tumor extension is similar to prior. Irregular mass measured previously at 7.1 x 5.3 cm now measures 6.5 x 5.5 cm. Abnormal soft tissue noted in the lower central mesentery and circumferential ill-defined wall thickening is visible in the rectum. Dilated small bowel loop in the right lower quadrant is contiguous with and appears to be involved by the tumor. Vascular/Lymphatic: There is abdominal aortic atherosclerosis without aneurysm. Borderline para-aortic lymphadenopathy in the abdomen is similar to prior. Nodal conglomeration in the posterior paramidline right mesentery is 18 mm short axis today compared to 15 mm previously. Reproductive: Prostate gland is enlarged. Other: No intraperitoneal free fluid. Musculoskeletal: No worrisome lytic or sclerotic osseous abnormality. IMPRESSION: 1. Interval progression of bilateral pulmonary metastases. Stable mediastinal lymphadenopathy. 2. The multiple intermediate attenuation liver lesions are similar to prior. Metastatic disease not excluded. 3. Abnormal wall thickening in the distal sigmoid colon with previously described colorectal mass involving the sigmoid colon and proximal rectum again noted. Although assessment is limited by noncontrast exam, overall appearance is generally similar to prior. 4. With better bladder distention on today's study, 17 mm polypoid lesion noted along the posterior left bladder wall. Imaging features highly concerning for urothelial neoplasm. 5. Persistent small bowel distension just proximal to an area where this loop of small bowel appears to be  involved by direct tumor extension. Tumor involvement likely results in a component of small bowel stricture. 6. No substantial hydronephrosis with bilateral internal ureteral stents. 7.  Aortic Atherosclerois (ICD10-170.0) Electronically Signed   By: Misty Stanley M.D.   On: 09/22/2018 10:29   Ct Chest Wo Contrast  Result Date: 09/22/2018 CLINICAL DATA:  Sigmoid colon cancer.  Restaging. EXAM: CT CHEST, ABDOMEN AND PELVIS WITHOUT CONTRAST TECHNIQUE: Multidetector CT imaging of the chest, abdomen and pelvis was performed following the standard protocol without IV contrast. COMPARISON:  07/09/2018 FINDINGS: CT CHEST FINDINGS Cardiovascular: The heart size is normal. No substantial pericardial effusion. Coronary artery calcification is evident. Atherosclerotic calcification is noted in the wall of the thoracic aorta. Right Port-A-Cath tip position in the mid SVC. Mediastinum/Nodes: The 16 mm precarinal lymph node measured previously is stable at 16 mm short axis. No other mediastinal lymphadenopathy evident. No evidence for gross hilar lymphadenopathy although assessment is limited by the lack of intravenous contrast on today's study. There is no axillary lymphadenopathy. The esophagus has normal imaging features. Tiny hiatal hernia noted. Lungs/Pleura: Bilateral pulmonary metastases again noted. *Index right upper lobe nodule measured previously at 18 mm is similar today at 19 mm (24/4). *19 mm left upper lobe nodule (27/4) has increased from 14 mm previously. *9 mm right middle lobe nodule (83/4) was 2 mm previously. *11 mm right lower lobe nodule (107/4) was 4 mm previously. No pleural effusion.  No focal airspace consolidation. Musculoskeletal: No worrisome lytic or sclerotic osseous abnormality. CT ABDOMEN PELVIS FINDINGS Hepatobiliary: Scattered small low-density liver lesions are similar to prior. Dominant lesion in the dome of the liver measures  2.1 cm today compared to 2.5 cm previously. Other smaller  liver lesions are similar in the interval. There is no evidence for gallstones, gallbladder wall thickening, or pericholecystic fluid. No intrahepatic or extrahepatic biliary dilation. Pancreas: No focal mass lesion. No dilatation of the main duct. No intraparenchymal cyst. No peripancreatic edema. Spleen: No splenomegaly. No focal mass lesion. Adrenals/Urinary Tract: No adrenal nodule or mass. Bilateral renal cysts. Bilateral double-J internal ureteral stents are stable in position without evidence for residual hydronephrosis. Bladder wall thickening evident with a 17 mm polypoid lesion along the posterior left bladder wall. Stomach/Bowel: Stomach is unremarkable. No gastric wall thickening. No evidence of outlet obstruction. Duodenum is normally positioned as is the ligament of Treitz. No small bowel wall thickening. No small bowel dilatation. Diverticular changes are noted in the right colon left abdominal transverse loop colostomy evident. Irregular wall thickening in the sigmoid colon with apparent transmural tumor extension is similar to prior. Irregular mass measured previously at 7.1 x 5.3 cm now measures 6.5 x 5.5 cm. Abnormal soft tissue noted in the lower central mesentery and circumferential ill-defined wall thickening is visible in the rectum. Dilated small bowel loop in the right lower quadrant is contiguous with and appears to be involved by the tumor. Vascular/Lymphatic: There is abdominal aortic atherosclerosis without aneurysm. Borderline para-aortic lymphadenopathy in the abdomen is similar to prior. Nodal conglomeration in the posterior paramidline right mesentery is 18 mm short axis today compared to 15 mm previously. Reproductive: Prostate gland is enlarged. Other: No intraperitoneal free fluid. Musculoskeletal: No worrisome lytic or sclerotic osseous abnormality. IMPRESSION: 1. Interval progression of bilateral pulmonary metastases. Stable mediastinal lymphadenopathy. 2. The multiple  intermediate attenuation liver lesions are similar to prior. Metastatic disease not excluded. 3. Abnormal wall thickening in the distal sigmoid colon with previously described colorectal mass involving the sigmoid colon and proximal rectum again noted. Although assessment is limited by noncontrast exam, overall appearance is generally similar to prior. 4. With better bladder distention on today's study, 17 mm polypoid lesion noted along the posterior left bladder wall. Imaging features highly concerning for urothelial neoplasm. 5. Persistent small bowel distension just proximal to an area where this loop of small bowel appears to be involved by direct tumor extension. Tumor involvement likely results in a component of small bowel stricture. 6. No substantial hydronephrosis with bilateral internal ureteral stents. 7.  Aortic Atherosclerois (ICD10-170.0) Electronically Signed   By: Misty Stanley M.D.   On: 09/22/2018 10:29    Medications: I have reviewed the patient's current medications.   Assessment/Plan:  1.Metastatic colon cancer-abdomen/pelvic mass appears to arise from the sigmoid colon  Noncontrast CT 04/14/2018- abdomen/pelvic mass, partial colonic and small bowel obstruction, fistula between the mass and small bowel loops, multiple bladder tumors, left hydronephrosis, bilateral lung metastases, lymphatic tumor spread in the lungs versus pneumonia, precarinal lymph node, retroperitoneal/mesenteric lymphadenopathy  Resection of multiple bladder masses 04/15/2018-adenocarcinoma consistent with a colonic primary, MSS,tumor mutation burden 10, KRAS G12D, No BRAF mutation  Cycle 1 FOLFOX 05/07/2018  Cycle 2 FOLFOX 05/22/2018  Cycle 3 FOLFOX 06/05/2018 (oxaliplatin dose reduced due to increased creatinine)  Cycle 4 FOLFOX 06/18/2018  Cycle 5 FOLFOX 07/03/2018  CT 07/09/2018-no significant change in lung nodules, resolution ofairspace disease, decreased size of liver metastases, near complete  resolution of bowel obstruction, new diverting colostomy  Cycle 6 FOLFOX 07/17/2018  Cycle 7 FOLFOX 07/31/2018  Cycle 8 FOLFOX 08/14/2018  Cycle 9 FOLFOX 08/28/2018 (oxaliplatin held due to mild neutropenia)  Cycle 10 FOLFOX  09/11/2018  CT 09/22/2018- enlargement of lung nodules, stable indeterminate liver lesions, similar mass in the distal sigmoid colon, 17 mm polypoid lesion seen in the left bladder, persistent small bowel distention proximal to an area of small bowel involvement by direct tumor extension   2.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 He will follow-up with Dr. Ninfa Linden. Status post right inguinal hernia repair with mesh 06/23/2018.  6.Anemia, multifactorial secondary to chronic disease and blood loss; transfused 2 units of blood 05/24/2018  7.Right lower extremity deep vein thrombosis 07/17/2018- started on Xarelto    Disposition: Miguel Gamble has metastatic colon cancer.  He appears unchanged.  He is completed 10 cycles of FOLFOX.  The restaging CTs reveal evidence of disease progression in the lungs.  There is a persistent pelvic mass and stable liver lesions.  The CEA is higher today.  I reviewed the CT images with Miguel Gamble and discussed treatment options. We discussed a treatment break, changing to FOLFIRI, and referral to consider a clinical trial.  He would like to continue treatment and agrees to proceed with FOLFIRI.  We reviewed potential toxicities associated with irinotecan including the chance of hematologic toxicity, alopecia, and diarrhea.  He will return for a first cycle of FOLFIRI on 10/09/2018.  I do not recommend bevacizumab in his case secondary to risk of fistula formation.  Betsy Coder, MD  09/25/2018   2:22 PM

## 2018-09-25 NOTE — Progress Notes (Signed)
DISCONTINUE ON PATHWAY REGIMEN - Colorectal     A cycle is every 14 days:     Oxaliplatin      Leucovorin      5-Fluorouracil      5-Fluorouracil   **Always confirm dose/schedule in your pharmacy ordering system**  REASON: Disease Progression PRIOR TREATMENT: [id number]: mFOLFOX6 q14 Days TREATMENT RESPONSE: Partial Response (PR)  START ON PATHWAY REGIMEN - Colorectal     A cycle is every 14 days:     Irinotecan      Leucovorin      Fluorouracil      Fluorouracil   **Always confirm dose/schedule in your pharmacy ordering system**  Patient Characteristics: Distant Metastases, Nonsurgical Candidate, KRAS/NRAS Mutation Positive/Unknown (BRAF V600 Wild-Type/Unknown), Standard Cytotoxic Therapy, Second Line Standard Cytotoxic Therapy, Bevacizumab Ineligible Tumor Location: Colon Therapeutic Status: Distant Metastases Microsatellite/Mismatch Repair Status: MSS/pMMR BRAF Mutation Status: Wild-Type (no mutation) KRAS/NRAS Mutation Status: Mutation Positive Standard Cytotoxic Line of Therapy: Second Line Standard Cytotoxic Therapy Bevacizumab Eligibility: Ineligible Intent of Therapy: Non-Curative / Palliative Intent, Discussed with Patient 

## 2018-10-04 ENCOUNTER — Other Ambulatory Visit: Payer: Self-pay | Admitting: Oncology

## 2018-10-09 ENCOUNTER — Inpatient Hospital Stay: Payer: PRIVATE HEALTH INSURANCE

## 2018-10-09 ENCOUNTER — Inpatient Hospital Stay (HOSPITAL_BASED_OUTPATIENT_CLINIC_OR_DEPARTMENT_OTHER): Payer: PRIVATE HEALTH INSURANCE | Admitting: Nurse Practitioner

## 2018-10-09 ENCOUNTER — Encounter: Payer: Self-pay | Admitting: Nurse Practitioner

## 2018-10-09 ENCOUNTER — Other Ambulatory Visit: Payer: Self-pay

## 2018-10-09 VITALS — BP 133/78 | HR 80 | Temp 98.5°F | Resp 17 | Ht 68.0 in | Wt 147.8 lb

## 2018-10-09 DIAGNOSIS — C772 Secondary and unspecified malignant neoplasm of intra-abdominal lymph nodes: Secondary | ICD-10-CM | POA: Diagnosis not present

## 2018-10-09 DIAGNOSIS — D649 Anemia, unspecified: Secondary | ICD-10-CM

## 2018-10-09 DIAGNOSIS — Z95828 Presence of other vascular implants and grafts: Secondary | ICD-10-CM

## 2018-10-09 DIAGNOSIS — R200 Anesthesia of skin: Secondary | ICD-10-CM

## 2018-10-09 DIAGNOSIS — C7802 Secondary malignant neoplasm of left lung: Secondary | ICD-10-CM

## 2018-10-09 DIAGNOSIS — C187 Malignant neoplasm of sigmoid colon: Secondary | ICD-10-CM | POA: Diagnosis not present

## 2018-10-09 DIAGNOSIS — K449 Diaphragmatic hernia without obstruction or gangrene: Secondary | ICD-10-CM

## 2018-10-09 DIAGNOSIS — C7801 Secondary malignant neoplasm of right lung: Secondary | ICD-10-CM

## 2018-10-09 DIAGNOSIS — Z5111 Encounter for antineoplastic chemotherapy: Secondary | ICD-10-CM | POA: Diagnosis not present

## 2018-10-09 DIAGNOSIS — K4090 Unilateral inguinal hernia, without obstruction or gangrene, not specified as recurrent: Secondary | ICD-10-CM

## 2018-10-09 DIAGNOSIS — Z7901 Long term (current) use of anticoagulants: Secondary | ICD-10-CM

## 2018-10-09 DIAGNOSIS — Z86718 Personal history of other venous thrombosis and embolism: Secondary | ICD-10-CM

## 2018-10-09 DIAGNOSIS — N281 Cyst of kidney, acquired: Secondary | ICD-10-CM

## 2018-10-09 DIAGNOSIS — Z933 Colostomy status: Secondary | ICD-10-CM

## 2018-10-09 DIAGNOSIS — N138 Other obstructive and reflux uropathy: Secondary | ICD-10-CM

## 2018-10-09 DIAGNOSIS — I700 Atherosclerosis of aorta: Secondary | ICD-10-CM

## 2018-10-09 DIAGNOSIS — K56609 Unspecified intestinal obstruction, unspecified as to partial versus complete obstruction: Secondary | ICD-10-CM

## 2018-10-09 LAB — CMP (CANCER CENTER ONLY)
ALT: 9 U/L (ref 0–44)
AST: 21 U/L (ref 15–41)
Albumin: 3.3 g/dL — ABNORMAL LOW (ref 3.5–5.0)
Alkaline Phosphatase: 92 U/L (ref 38–126)
Anion gap: 8 (ref 5–15)
BUN: 18 mg/dL (ref 8–23)
CO2: 22 mmol/L (ref 22–32)
Calcium: 8.7 mg/dL — ABNORMAL LOW (ref 8.9–10.3)
Chloride: 110 mmol/L (ref 98–111)
Creatinine: 1.78 mg/dL — ABNORMAL HIGH (ref 0.61–1.24)
GFR, Est AFR Am: 44 mL/min — ABNORMAL LOW (ref >60)
GFR, Estimated: 38 mL/min — ABNORMAL LOW (ref >60)
Glucose, Bld: 109 mg/dL — ABNORMAL HIGH (ref 70–99)
Potassium: 3.7 mmol/L (ref 3.5–5.1)
Sodium: 140 mmol/L (ref 135–145)
Total Bilirubin: 0.3 mg/dL (ref 0.3–1.2)
Total Protein: 6.9 g/dL (ref 6.5–8.1)

## 2018-10-09 LAB — CBC WITH DIFFERENTIAL (CANCER CENTER ONLY)
Abs Immature Granulocytes: 0.02 10*3/uL (ref 0.00–0.07)
Basophils Absolute: 0.1 10*3/uL (ref 0.0–0.1)
Basophils Relative: 1 %
Eosinophils Absolute: 0.2 10*3/uL (ref 0.0–0.5)
Eosinophils Relative: 2 %
HCT: 26.4 % — ABNORMAL LOW (ref 39.0–52.0)
Hemoglobin: 7.9 g/dL — ABNORMAL LOW (ref 13.0–17.0)
Immature Granulocytes: 0 %
Lymphocytes Relative: 14 %
Lymphs Abs: 1.1 10*3/uL (ref 0.7–4.0)
MCH: 27.2 pg (ref 26.0–34.0)
MCHC: 29.9 g/dL — ABNORMAL LOW (ref 30.0–36.0)
MCV: 91 fL (ref 80.0–100.0)
Monocytes Absolute: 0.9 10*3/uL (ref 0.1–1.0)
Monocytes Relative: 11 %
Neutro Abs: 5.6 10*3/uL (ref 1.7–7.7)
Neutrophils Relative %: 72 %
Platelet Count: 254 10*3/uL (ref 150–400)
RBC: 2.9 MIL/uL — ABNORMAL LOW (ref 4.22–5.81)
RDW: 18.1 % — ABNORMAL HIGH (ref 11.5–15.5)
WBC Count: 7.8 10*3/uL (ref 4.0–10.5)
nRBC: 0 % (ref 0.0–0.2)

## 2018-10-09 LAB — CEA (IN HOUSE-CHCC): CEA (CHCC-In House): 59.65 ng/mL — ABNORMAL HIGH (ref 0.00–5.00)

## 2018-10-09 MED ORDER — ATROPINE SULFATE 1 MG/ML IJ SOLN
0.5000 mg | Freq: Once | INTRAMUSCULAR | Status: AC | PRN
  Administered 2018-10-09: 0.5 mg via INTRAVENOUS

## 2018-10-09 MED ORDER — DEXAMETHASONE SODIUM PHOSPHATE 10 MG/ML IJ SOLN
10.0000 mg | Freq: Once | INTRAMUSCULAR | Status: AC
  Administered 2018-10-09: 10 mg via INTRAVENOUS

## 2018-10-09 MED ORDER — SODIUM CHLORIDE 0.9 % IV SOLN
2400.0000 mg/m2 | INTRAVENOUS | Status: DC
  Administered 2018-10-09: 4300 mg via INTRAVENOUS
  Filled 2018-10-09: qty 86

## 2018-10-09 MED ORDER — IRINOTECAN HCL CHEMO INJECTION 100 MG/5ML
180.0000 mg/m2 | Freq: Once | INTRAVENOUS | Status: AC
  Administered 2018-10-09: 320 mg via INTRAVENOUS
  Filled 2018-10-09: qty 15

## 2018-10-09 MED ORDER — SODIUM CHLORIDE 0.9 % IV SOLN
Freq: Once | INTRAVENOUS | Status: AC
  Administered 2018-10-09: 11:00:00 via INTRAVENOUS
  Filled 2018-10-09: qty 250

## 2018-10-09 MED ORDER — DEXAMETHASONE SODIUM PHOSPHATE 10 MG/ML IJ SOLN
INTRAMUSCULAR | Status: AC
  Filled 2018-10-09: qty 1

## 2018-10-09 MED ORDER — FLUOROURACIL CHEMO INJECTION 2.5 GM/50ML
400.0000 mg/m2 | Freq: Once | INTRAVENOUS | Status: AC
  Administered 2018-10-09: 700 mg via INTRAVENOUS
  Filled 2018-10-09: qty 14

## 2018-10-09 MED ORDER — PALONOSETRON HCL INJECTION 0.25 MG/5ML
INTRAVENOUS | Status: AC
  Filled 2018-10-09: qty 5

## 2018-10-09 MED ORDER — LEUCOVORIN CALCIUM INJECTION 350 MG
400.0000 mg/m2 | Freq: Once | INTRAVENOUS | Status: AC
  Administered 2018-10-09: 716 mg via INTRAVENOUS
  Filled 2018-10-09: qty 35.8

## 2018-10-09 MED ORDER — SODIUM CHLORIDE 0.9% FLUSH
10.0000 mL | INTRAVENOUS | Status: DC | PRN
  Administered 2018-10-09: 09:00:00 10 mL
  Filled 2018-10-09: qty 10

## 2018-10-09 MED ORDER — PALONOSETRON HCL INJECTION 0.25 MG/5ML
0.2500 mg | Freq: Once | INTRAVENOUS | Status: AC
  Administered 2018-10-09: 0.25 mg via INTRAVENOUS

## 2018-10-09 MED ORDER — ATROPINE SULFATE 1 MG/ML IJ SOLN
INTRAMUSCULAR | Status: AC
  Filled 2018-10-09: qty 1

## 2018-10-09 NOTE — Patient Instructions (Signed)

## 2018-10-09 NOTE — Progress Notes (Signed)
Per NP, ok to treat with Hgb 7.9 and creatinine 1.78.

## 2018-10-09 NOTE — Progress Notes (Signed)
[facility] OFFICE PROGRESS NOTE   Diagnosis: Colon cancer  INTERVAL HISTORY:   [name] returns as scheduled.  He reports feeling well.  No nausea or vomiting.  No mouth sores.  No diarrhea.  He denies pain.  He has a good appetite.  He notes less tingling in the hands and feet.  No bleeding.  He has a good energy level.  No shortness of breath.  Objective:  Vital signs in last 24 hours:  Blood pressure 133/78, pulse 80, temperature 98.5 F (36.9 C), temperature source Oral, resp. rate 17, height 5' 8"  (1.727 m), weight 147 lb 12.8 oz (67 kg), SpO2 100 %.    HEENT: No thrush or ulcers. GI: No hepatomegaly.  Left lower quadrant colostomy. Vascular: No leg edema.  Right lower leg is slightly larger than the left lower leg.  Skin: Palms without erythema. Port-A-Cath without erythema.   Lab Results:  Lab Results  Component Value Date   WBC 7.8 10/09/2018   HGB 7.9 (L) 10/09/2018   HCT 26.4 (L) 10/09/2018   MCV 91.0 10/09/2018   PLT 254 10/09/2018   NEUTROABS 5.6 10/09/2018    Imaging:  No results found.  Medications: I have reviewed the patient's current medications.  Assessment/Plan: 1.Metastatic colon cancer-abdomen/pelvic mass appears to arise from the sigmoid colon  Noncontrast CT 04/14/2018- abdomen/pelvic mass, partial colonic and small bowel obstruction, fistula between the mass and small bowel loops, multiple bladder tumors, left hydronephrosis, bilateral lung metastases, lymphatic tumor spread in the lungs versus pneumonia, precarinal lymph node, retroperitoneal/mesenteric lymphadenopathy  Resection of multiple bladder masses 04/15/2018-adenocarcinoma consistent with a colonic primary, MSS,tumor mutation burden 10, KRAS G12D, No BRAF mutation  Cycle 1 FOLFOX 05/07/2018  Cycle 2 FOLFOX 05/22/2018  Cycle 3 FOLFOX 06/05/2018 (oxaliplatin dose reduced due to increased creatinine)  Cycle 4 FOLFOX 06/18/2018  Cycle 5 FOLFOX 07/03/2018  CT  07/09/2018-no significant change in lung nodules, resolution ofairspace disease, decreased size of liver metastases, near complete resolution of bowel obstruction, new diverting colostomy  Cycle 6 FOLFOX 07/17/2018  Cycle 7 FOLFOX 07/31/2018  Cycle 8 FOLFOX 08/14/2018  Cycle 9 FOLFOX 08/28/2018 (oxaliplatin held due to mild neutropenia)  Cycle 10 FOLFOX 09/11/2018  CT 09/22/2018- enlargement of lung nodules, stable indeterminate liver lesions, similar mass in the distal sigmoid colon, 17 mm polypoid lesion seen in the left bladder, persistent small bowel distention proximal to an area of small bowel involvement by direct tumor extension  Cycle 1 FOLFIRI 10/09/2018   2.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 He will follow-up with Dr. Ninfa Linden. Status post right inguinal hernia repair with mesh 06/23/2018.  6.Anemia, multifactorial secondary to chronic disease and blood loss; transfused 2 units of blood 05/24/2018  7.Right lower extremity deep vein thrombosis 07/17/2018- started on Xarelto     Disposition: [facility] Gamble appears stable.  He is scheduled to begin cycle 1 FOLFIRI today.  We again reviewed potential toxicities.  He agrees to proceed.  We reviewed the CBC from today.  Counts are adequate for treatment.  He has persistent anemia, appears asymptomatic, declines transfusion support.  He will return for lab, follow-up, cycle 2 FOLFIRI in 2 weeks.  He will contact the office in the interim with any problems.    Ned Card ANP/GNP-BC   10/09/2018  10:00 AM

## 2018-10-09 NOTE — Progress Notes (Signed)
During CPT-11/LCV infusion, patient began c/o abdominal cramping. Infusion interrupted and atropine administered. Patient verbalized near-immediate complete resolution of symptoms. Infusions resumed.

## 2018-10-09 NOTE — Patient Instructions (Signed)
[facility] Discharge Instructions for Patients Receiving Chemotherapy  Today you received the following chemotherapy agents:  Irinotecan, Leucovorin, and 5FU.   To help prevent nausea and vomiting after your treatment, we encourage you to take your nausea medication as directed.   If you develop nausea and vomiting that is not controlled by your nausea medication, call the clinic.   BELOW ARE SYMPTOMS THAT SHOULD BE REPORTED IMMEDIATELY:  *FEVER GREATER THAN 100.5 F  *CHILLS WITH OR WITHOUT FEVER  NAUSEA AND VOMITING THAT IS NOT CONTROLLED WITH YOUR NAUSEA MEDICATION  *UNUSUAL SHORTNESS OF BREATH  *UNUSUAL BRUISING OR BLEEDING  TENDERNESS IN MOUTH AND THROAT WITH OR WITHOUT PRESENCE OF ULCERS  *URINARY PROBLEMS  *BOWEL PROBLEMS  UNUSUAL RASH Items with * indicate a potential emergency and should be followed up as soon as possible.  Feel free to call the clinic should you have any questions or concerns. The clinic phone number is [phone].  Please show the CHEMO ALERT CARD at check-in to the Emergency Department and triage nurse.   

## 2018-10-10 ENCOUNTER — Telehealth: Payer: Self-pay | Admitting: Medical Oncology

## 2018-10-10 ENCOUNTER — Telehealth: Payer: Self-pay | Admitting: Nurse Practitioner

## 2018-10-10 NOTE — Telephone Encounter (Signed)
Called and spoke with patient. Confirmed dates and times of appts ° °

## 2018-10-10 NOTE — Telephone Encounter (Signed)
Doing well post chemo . He was most worried about diarrhea and has imodium if he needs it. He has not experienced any abdominal issues.  I encouraged drinking extra fluids and to call for any adverse effects.

## 2018-10-11 ENCOUNTER — Inpatient Hospital Stay: Payer: PRIVATE HEALTH INSURANCE

## 2018-10-11 ENCOUNTER — Other Ambulatory Visit: Payer: Self-pay

## 2018-10-11 VITALS — BP 119/59 | HR 75 | Temp 98.3°F | Resp 16

## 2018-10-11 DIAGNOSIS — C187 Malignant neoplasm of sigmoid colon: Secondary | ICD-10-CM

## 2018-10-11 DIAGNOSIS — Z5111 Encounter for antineoplastic chemotherapy: Secondary | ICD-10-CM | POA: Diagnosis not present

## 2018-10-11 MED ORDER — HEPARIN SOD (PORK) LOCK FLUSH 100 UNIT/ML IV SOLN
500.0000 [IU] | Freq: Once | INTRAVENOUS | Status: AC | PRN
  Administered 2018-10-11: 500 [IU]
  Filled 2018-10-11: qty 5

## 2018-10-11 MED ORDER — SODIUM CHLORIDE 0.9% FLUSH
10.0000 mL | INTRAVENOUS | Status: DC | PRN
  Administered 2018-10-11: 10 mL
  Filled 2018-10-11: qty 10

## 2018-10-19 ENCOUNTER — Other Ambulatory Visit: Payer: Self-pay | Admitting: Oncology

## 2018-10-22 ENCOUNTER — Inpatient Hospital Stay: Payer: PRIVATE HEALTH INSURANCE

## 2018-10-22 ENCOUNTER — Telehealth: Payer: Self-pay | Admitting: Nurse Practitioner

## 2018-10-22 ENCOUNTER — Inpatient Hospital Stay: Payer: PRIVATE HEALTH INSURANCE | Admitting: Nurse Practitioner

## 2018-10-22 NOTE — Telephone Encounter (Signed)
R/s appt per 7/07 sch message- pt aware of new appt date and time

## 2018-10-23 ENCOUNTER — Other Ambulatory Visit: Payer: Self-pay

## 2018-10-23 ENCOUNTER — Inpatient Hospital Stay (HOSPITAL_BASED_OUTPATIENT_CLINIC_OR_DEPARTMENT_OTHER): Payer: PRIVATE HEALTH INSURANCE | Admitting: Nurse Practitioner

## 2018-10-23 ENCOUNTER — Inpatient Hospital Stay: Payer: PRIVATE HEALTH INSURANCE

## 2018-10-23 ENCOUNTER — Inpatient Hospital Stay: Payer: PRIVATE HEALTH INSURANCE | Attending: Nurse Practitioner

## 2018-10-23 ENCOUNTER — Encounter: Payer: Self-pay | Admitting: Nurse Practitioner

## 2018-10-23 VITALS — BP 138/68 | HR 72 | Temp 97.7°F | Resp 18 | Ht 68.0 in | Wt 148.8 lb

## 2018-10-23 DIAGNOSIS — N138 Other obstructive and reflux uropathy: Secondary | ICD-10-CM | POA: Insufficient documentation

## 2018-10-23 DIAGNOSIS — C187 Malignant neoplasm of sigmoid colon: Secondary | ICD-10-CM | POA: Diagnosis not present

## 2018-10-23 DIAGNOSIS — R630 Anorexia: Secondary | ICD-10-CM | POA: Diagnosis not present

## 2018-10-23 DIAGNOSIS — Z5111 Encounter for antineoplastic chemotherapy: Secondary | ICD-10-CM | POA: Diagnosis not present

## 2018-10-23 DIAGNOSIS — R634 Abnormal weight loss: Secondary | ICD-10-CM | POA: Insufficient documentation

## 2018-10-23 DIAGNOSIS — Z86718 Personal history of other venous thrombosis and embolism: Secondary | ICD-10-CM | POA: Diagnosis not present

## 2018-10-23 DIAGNOSIS — R200 Anesthesia of skin: Secondary | ICD-10-CM | POA: Insufficient documentation

## 2018-10-23 DIAGNOSIS — C772 Secondary and unspecified malignant neoplasm of intra-abdominal lymph nodes: Secondary | ICD-10-CM | POA: Insufficient documentation

## 2018-10-23 DIAGNOSIS — Z7901 Long term (current) use of anticoagulants: Secondary | ICD-10-CM

## 2018-10-23 DIAGNOSIS — Z95828 Presence of other vascular implants and grafts: Secondary | ICD-10-CM

## 2018-10-23 LAB — CMP (CANCER CENTER ONLY)
ALT: 6 U/L (ref 0–44)
AST: 15 U/L (ref 15–41)
Albumin: 3.1 g/dL — ABNORMAL LOW (ref 3.5–5.0)
Alkaline Phosphatase: 87 U/L (ref 38–126)
Anion gap: 9 (ref 5–15)
BUN: 16 mg/dL (ref 8–23)
CO2: 21 mmol/L — ABNORMAL LOW (ref 22–32)
Calcium: 8.4 mg/dL — ABNORMAL LOW (ref 8.9–10.3)
Chloride: 113 mmol/L — ABNORMAL HIGH (ref 98–111)
Creatinine: 1.74 mg/dL — ABNORMAL HIGH (ref 0.61–1.24)
GFR, Est AFR Am: 45 mL/min — ABNORMAL LOW (ref >60)
GFR, Estimated: 39 mL/min — ABNORMAL LOW (ref >60)
Glucose, Bld: 103 mg/dL — ABNORMAL HIGH (ref 70–99)
Potassium: 3.6 mmol/L (ref 3.5–5.1)
Sodium: 143 mmol/L (ref 135–145)
Total Bilirubin: 0.4 mg/dL (ref 0.3–1.2)
Total Protein: 6.5 g/dL (ref 6.5–8.1)

## 2018-10-23 LAB — CBC WITH DIFFERENTIAL (CANCER CENTER ONLY)
Abs Immature Granulocytes: 0.01 10*3/uL (ref 0.00–0.07)
Basophils Absolute: 0 10*3/uL (ref 0.0–0.1)
Basophils Relative: 1 %
Eosinophils Absolute: 0.3 10*3/uL (ref 0.0–0.5)
Eosinophils Relative: 8 %
HCT: 24.5 % — ABNORMAL LOW (ref 39.0–52.0)
Hemoglobin: 7.5 g/dL — ABNORMAL LOW (ref 13.0–17.0)
Immature Granulocytes: 0 %
Lymphocytes Relative: 22 %
Lymphs Abs: 0.8 10*3/uL (ref 0.7–4.0)
MCH: 27.1 pg (ref 26.0–34.0)
MCHC: 30.6 g/dL (ref 30.0–36.0)
MCV: 88.4 fL (ref 80.0–100.0)
Monocytes Absolute: 0.4 10*3/uL (ref 0.1–1.0)
Monocytes Relative: 10 %
Neutro Abs: 2.1 10*3/uL (ref 1.7–7.7)
Neutrophils Relative %: 59 %
Platelet Count: 212 10*3/uL (ref 150–400)
RBC: 2.77 MIL/uL — ABNORMAL LOW (ref 4.22–5.81)
RDW: 17.2 % — ABNORMAL HIGH (ref 11.5–15.5)
WBC Count: 3.5 10*3/uL — ABNORMAL LOW (ref 4.0–10.5)
nRBC: 0 % (ref 0.0–0.2)

## 2018-10-23 LAB — CEA (IN HOUSE-CHCC): CEA (CHCC-In House): 58.04 ng/mL — ABNORMAL HIGH (ref 0.00–5.00)

## 2018-10-23 MED ORDER — ATROPINE SULFATE 0.4 MG/ML IJ SOLN
INTRAMUSCULAR | Status: AC
  Filled 2018-10-23: qty 1

## 2018-10-23 MED ORDER — SODIUM CHLORIDE 0.9% FLUSH
10.0000 mL | INTRAVENOUS | Status: DC | PRN
  Administered 2018-10-23: 10:00:00 10 mL
  Filled 2018-10-23: qty 10

## 2018-10-23 MED ORDER — LEUCOVORIN CALCIUM INJECTION 350 MG
400.0000 mg/m2 | Freq: Once | INTRAVENOUS | Status: AC
  Administered 2018-10-23: 13:00:00 716 mg via INTRAVENOUS
  Filled 2018-10-23: qty 35.8

## 2018-10-23 MED ORDER — PALONOSETRON HCL INJECTION 0.25 MG/5ML
INTRAVENOUS | Status: AC
  Filled 2018-10-23: qty 5

## 2018-10-23 MED ORDER — DEXAMETHASONE SODIUM PHOSPHATE 10 MG/ML IJ SOLN
10.0000 mg | Freq: Once | INTRAMUSCULAR | Status: AC
  Administered 2018-10-23: 12:00:00 10 mg via INTRAVENOUS

## 2018-10-23 MED ORDER — ATROPINE SULFATE 1 MG/ML IJ SOLN
0.5000 mg | Freq: Once | INTRAMUSCULAR | Status: AC | PRN
  Administered 2018-10-23: 13:00:00 0.5 mg via INTRAVENOUS

## 2018-10-23 MED ORDER — IRINOTECAN HCL CHEMO INJECTION 100 MG/5ML
180.0000 mg/m2 | Freq: Once | INTRAVENOUS | Status: AC
  Administered 2018-10-23: 13:00:00 320 mg via INTRAVENOUS
  Filled 2018-10-23: qty 15

## 2018-10-23 MED ORDER — SODIUM CHLORIDE 0.9 % IV SOLN
Freq: Once | INTRAVENOUS | Status: AC
  Administered 2018-10-23: 12:00:00 via INTRAVENOUS
  Filled 2018-10-23: qty 250

## 2018-10-23 MED ORDER — SODIUM CHLORIDE 0.9% FLUSH
10.0000 mL | INTRAVENOUS | Status: DC | PRN
  Filled 2018-10-23: qty 10

## 2018-10-23 MED ORDER — HEPARIN SOD (PORK) LOCK FLUSH 100 UNIT/ML IV SOLN
500.0000 [IU] | Freq: Once | INTRAVENOUS | Status: DC | PRN
  Filled 2018-10-23: qty 5

## 2018-10-23 MED ORDER — FLUOROURACIL CHEMO INJECTION 2.5 GM/50ML
400.0000 mg/m2 | Freq: Once | INTRAVENOUS | Status: AC
  Administered 2018-10-23: 15:00:00 700 mg via INTRAVENOUS
  Filled 2018-10-23: qty 14

## 2018-10-23 MED ORDER — ATROPINE SULFATE 1 MG/ML IJ SOLN
INTRAMUSCULAR | Status: AC
  Filled 2018-10-23: qty 1

## 2018-10-23 MED ORDER — SODIUM CHLORIDE 0.9 % IV SOLN
2400.0000 mg/m2 | INTRAVENOUS | Status: DC
  Administered 2018-10-23: 15:00:00 4300 mg via INTRAVENOUS
  Filled 2018-10-23: qty 86

## 2018-10-23 MED ORDER — DEXAMETHASONE SODIUM PHOSPHATE 10 MG/ML IJ SOLN
INTRAMUSCULAR | Status: AC
  Filled 2018-10-23: qty 1

## 2018-10-23 MED ORDER — PALONOSETRON HCL INJECTION 0.25 MG/5ML
0.2500 mg | Freq: Once | INTRAVENOUS | Status: AC
  Administered 2018-10-23: 0.25 mg via INTRAVENOUS

## 2018-10-23 NOTE — Patient Instructions (Signed)
[facility] Discharge Instructions for Patients Receiving Chemotherapy  Today you received the following chemotherapy agents:  Irinotecan, Leucovorin, and 5FU.   To help prevent nausea and vomiting after your treatment, we encourage you to take your nausea medication as directed.   If you develop nausea and vomiting that is not controlled by your nausea medication, call the clinic.   BELOW ARE SYMPTOMS THAT SHOULD BE REPORTED IMMEDIATELY:  *FEVER GREATER THAN 100.5 F  *CHILLS WITH OR WITHOUT FEVER  NAUSEA AND VOMITING THAT IS NOT CONTROLLED WITH YOUR NAUSEA MEDICATION  *UNUSUAL SHORTNESS OF BREATH  *UNUSUAL BRUISING OR BLEEDING  TENDERNESS IN MOUTH AND THROAT WITH OR WITHOUT PRESENCE OF ULCERS  *URINARY PROBLEMS  *BOWEL PROBLEMS  UNUSUAL RASH Items with * indicate a potential emergency and should be followed up as soon as possible.  Feel free to call the clinic should you have any questions or concerns. The clinic phone number is [phone].  Please show the CHEMO ALERT CARD at check-in to the Emergency Department and triage nurse.   

## 2018-10-23 NOTE — Patient Instructions (Signed)

## 2018-10-23 NOTE — Progress Notes (Signed)
[facility] OFFICE PROGRESS NOTE   Diagnosis: Colon cancer  INTERVAL HISTORY:   Miguel Gamble returns as scheduled.  He completed cycle 1 FOLFIRI 10/09/2018.  During the irinotecan infusion he developed mild crampy abdominal discomfort.  Symptoms resolved quickly following atropine.  He denies nausea/vomiting.  No mouth sores.  No diarrhea.  No shortness of breath.  No fatigue.  He continues to exercise.  Objective:  Vital signs in last 24 hours:  Blood pressure 138/68, pulse 72, temperature 97.7 F (36.5 C), temperature source Temporal, resp. rate 18, height 5' 8"  (1.727 m), weight 148 lb 12.8 oz (67.5 kg), SpO2 100 %.    HEENT: No thrush or ulcers. GI: Abdomen is soft and nontender.  Liver edge is palpable. Vascular: No leg edema. Skin: Palms without erythema. Port-A-Cath without erythema.   Lab Results:  Lab Results  Component Value Date   WBC 3.5 (L) 10/23/2018   HGB 7.5 (L) 10/23/2018   HCT 24.5 (L) 10/23/2018   MCV 88.4 10/23/2018   PLT 212 10/23/2018   NEUTROABS 2.1 10/23/2018    Imaging:  No results found.  Medications: I have reviewed the patient's current medications.  Assessment/Plan: 1.Metastatic colon cancer-abdomen/pelvic mass appears to arise from the sigmoid colon  Noncontrast CT 04/14/2018-abdomen/pelvic mass, partial colonic and small bowel obstruction, fistula between the mass and small bowel loops, multiple bladder tumors, left hydronephrosis, bilateral lung metastases, lymphatic tumor spread in the lungs versus pneumonia, precarinal lymph node, retroperitoneal/mesenteric lymphadenopathy  Resection of multiple bladder masses 04/15/2018-adenocarcinoma consistent with a colonic primary, MSS,tumor mutation burden 10, KRAS G12D, No BRAF mutation  Cycle 1 FOLFOX 05/07/2018  Cycle 2 FOLFOX 05/22/2018  Cycle 3 FOLFOX 06/05/2018 (oxaliplatin dose reduced due to increased creatinine)  Cycle 4 FOLFOX 06/18/2018  Cycle 5 FOLFOX 07/03/2018   CT 07/09/2018-no significant change in lung nodules, resolution ofairspace disease, decreased size of liver metastases, near complete resolution of bowel obstruction, new diverting colostomy  Cycle 6 FOLFOX 07/17/2018  Cycle 7 FOLFOX 07/31/2018  Cycle 8 FOLFOX 08/14/2018  Cycle 9 FOLFOX 08/28/2018 (oxaliplatin held due to mild neutropenia)  Cycle 10 FOLFOX 09/11/2018  CT 09/22/2018-enlargement of lung nodules, stable indeterminate liver lesions, similar mass in the distal sigmoid colon, 17 mm polypoid lesion seen in the left bladder, persistent small bowel distention proximal to an area of small bowel involvement by direct tumor extension  Cycle 1 FOLFIRI 10/09/2018  Cycle 2 FOLFIRI 10/23/2018   2.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 He will follow-up with Dr. Ninfa Linden. Status post right inguinal hernia repair with mesh 06/23/2018.  6.Anemia, multifactorial secondary to chronic disease and blood loss; transfused 2 units of blood 05/24/2018  7.Right lower extremity deep vein thrombosis 07/17/2018-started on Xarelto   Disposition: Miguel Gamble appears stable.  He has completed 1 cycle of FOLFIRI.  Overall he tolerated well.  Plan to proceed with cycle 2 today as scheduled.  We reviewed the labs from today, adequate for treatment.  He has stable anemia, is asymptomatic, declines transfusion support.  The white count is mildly decreased, ANC within normal limits.  Creatinine is stable.  He will return for lab, follow-up and cycle 3 FOLFIRI in 2 weeks.  He will contact the office in the interim with any problems.  Plan reviewed with Dr. Benay Spice.    Ned Card ANP/GNP-BC  10/23/2018  10:57 AM

## 2018-10-23 NOTE — Progress Notes (Signed)
During infusion patient began c/o abdominal cramping and "flatulence." Infusion paused and atropine given. Patient verbalized rapid improvement in symptoms and infusion resumed.

## 2018-10-24 ENCOUNTER — Telehealth: Payer: Self-pay | Admitting: Nurse Practitioner

## 2018-10-24 NOTE — Telephone Encounter (Signed)
Called and left msg about appt

## 2018-10-25 ENCOUNTER — Inpatient Hospital Stay: Payer: PRIVATE HEALTH INSURANCE

## 2018-10-25 ENCOUNTER — Other Ambulatory Visit: Payer: Self-pay

## 2018-10-25 VITALS — BP 148/68 | HR 86 | Temp 98.7°F | Resp 16

## 2018-10-25 DIAGNOSIS — Z5111 Encounter for antineoplastic chemotherapy: Secondary | ICD-10-CM | POA: Diagnosis not present

## 2018-10-25 DIAGNOSIS — C187 Malignant neoplasm of sigmoid colon: Secondary | ICD-10-CM

## 2018-10-25 MED ORDER — SODIUM CHLORIDE 0.9% FLUSH
10.0000 mL | INTRAVENOUS | Status: DC | PRN
  Administered 2018-10-25: 10 mL
  Filled 2018-10-25: qty 10

## 2018-10-25 MED ORDER — HEPARIN SOD (PORK) LOCK FLUSH 100 UNIT/ML IV SOLN
500.0000 [IU] | Freq: Once | INTRAVENOUS | Status: AC | PRN
  Administered 2018-10-25: 500 [IU]
  Filled 2018-10-25: qty 5

## 2018-11-02 ENCOUNTER — Other Ambulatory Visit: Payer: Self-pay | Admitting: Oncology

## 2018-11-05 ENCOUNTER — Inpatient Hospital Stay: Payer: PRIVATE HEALTH INSURANCE

## 2018-11-05 ENCOUNTER — Other Ambulatory Visit: Payer: Self-pay

## 2018-11-05 ENCOUNTER — Inpatient Hospital Stay (HOSPITAL_BASED_OUTPATIENT_CLINIC_OR_DEPARTMENT_OTHER): Payer: PRIVATE HEALTH INSURANCE | Admitting: Oncology

## 2018-11-05 VITALS — BP 132/71 | HR 81 | Temp 99.1°F | Resp 18 | Ht 68.0 in | Wt 148.4 lb

## 2018-11-05 DIAGNOSIS — Z5111 Encounter for antineoplastic chemotherapy: Secondary | ICD-10-CM

## 2018-11-05 DIAGNOSIS — C772 Secondary and unspecified malignant neoplasm of intra-abdominal lymph nodes: Secondary | ICD-10-CM | POA: Diagnosis not present

## 2018-11-05 DIAGNOSIS — C187 Malignant neoplasm of sigmoid colon: Secondary | ICD-10-CM

## 2018-11-05 DIAGNOSIS — Z86718 Personal history of other venous thrombosis and embolism: Secondary | ICD-10-CM

## 2018-11-05 DIAGNOSIS — R630 Anorexia: Secondary | ICD-10-CM

## 2018-11-05 DIAGNOSIS — Z95828 Presence of other vascular implants and grafts: Secondary | ICD-10-CM

## 2018-11-05 DIAGNOSIS — R200 Anesthesia of skin: Secondary | ICD-10-CM | POA: Diagnosis not present

## 2018-11-05 DIAGNOSIS — R634 Abnormal weight loss: Secondary | ICD-10-CM

## 2018-11-05 DIAGNOSIS — N138 Other obstructive and reflux uropathy: Secondary | ICD-10-CM

## 2018-11-05 DIAGNOSIS — Z7901 Long term (current) use of anticoagulants: Secondary | ICD-10-CM

## 2018-11-05 LAB — CMP (CANCER CENTER ONLY)
ALT: 6 U/L (ref 0–44)
AST: 13 U/L — ABNORMAL LOW (ref 15–41)
Albumin: 3.2 g/dL — ABNORMAL LOW (ref 3.5–5.0)
Alkaline Phosphatase: 94 U/L (ref 38–126)
Anion gap: 8 (ref 5–15)
BUN: 16 mg/dL (ref 8–23)
CO2: 22 mmol/L (ref 22–32)
Calcium: 8.5 mg/dL — ABNORMAL LOW (ref 8.9–10.3)
Chloride: 111 mmol/L (ref 98–111)
Creatinine: 1.83 mg/dL — ABNORMAL HIGH (ref 0.61–1.24)
GFR, Est AFR Am: 43 mL/min — ABNORMAL LOW (ref >60)
GFR, Estimated: 37 mL/min — ABNORMAL LOW (ref >60)
Glucose, Bld: 112 mg/dL — ABNORMAL HIGH (ref 70–99)
Potassium: 3.7 mmol/L (ref 3.5–5.1)
Sodium: 141 mmol/L (ref 135–145)
Total Bilirubin: 0.4 mg/dL (ref 0.3–1.2)
Total Protein: 6.5 g/dL (ref 6.5–8.1)

## 2018-11-05 LAB — CBC WITH DIFFERENTIAL (CANCER CENTER ONLY)
Abs Immature Granulocytes: 0.01 10*3/uL (ref 0.00–0.07)
Basophils Absolute: 0 10*3/uL (ref 0.0–0.1)
Basophils Relative: 0 %
Eosinophils Absolute: 0.2 10*3/uL (ref 0.0–0.5)
Eosinophils Relative: 5 %
HCT: 24.3 % — ABNORMAL LOW (ref 39.0–52.0)
Hemoglobin: 7.5 g/dL — ABNORMAL LOW (ref 13.0–17.0)
Immature Granulocytes: 0 %
Lymphocytes Relative: 22 %
Lymphs Abs: 0.7 10*3/uL (ref 0.7–4.0)
MCH: 26.9 pg (ref 26.0–34.0)
MCHC: 30.9 g/dL (ref 30.0–36.0)
MCV: 87.1 fL (ref 80.0–100.0)
Monocytes Absolute: 0.3 10*3/uL (ref 0.1–1.0)
Monocytes Relative: 10 %
Neutro Abs: 2 10*3/uL (ref 1.7–7.7)
Neutrophils Relative %: 63 %
Platelet Count: 165 10*3/uL (ref 150–400)
RBC: 2.79 MIL/uL — ABNORMAL LOW (ref 4.22–5.81)
RDW: 17.7 % — ABNORMAL HIGH (ref 11.5–15.5)
WBC Count: 3.2 10*3/uL — ABNORMAL LOW (ref 4.0–10.5)
nRBC: 0 % (ref 0.0–0.2)

## 2018-11-05 LAB — CEA (IN HOUSE-CHCC): CEA (CHCC-In House): 59.41 ng/mL — ABNORMAL HIGH (ref 0.00–5.00)

## 2018-11-05 MED ORDER — SODIUM CHLORIDE 0.9 % IV SOLN
Freq: Once | INTRAVENOUS | Status: AC
  Administered 2018-11-05: 13:00:00 via INTRAVENOUS
  Filled 2018-11-05: qty 250

## 2018-11-05 MED ORDER — ATROPINE SULFATE 0.4 MG/ML IJ SOLN
INTRAMUSCULAR | Status: AC
  Filled 2018-11-05: qty 1

## 2018-11-05 MED ORDER — PALONOSETRON HCL INJECTION 0.25 MG/5ML
0.2500 mg | Freq: Once | INTRAVENOUS | Status: AC
  Administered 2018-11-05: 0.25 mg via INTRAVENOUS

## 2018-11-05 MED ORDER — SODIUM CHLORIDE 0.9% FLUSH
10.0000 mL | INTRAVENOUS | Status: DC | PRN
  Administered 2018-11-05: 10 mL
  Filled 2018-11-05: qty 10

## 2018-11-05 MED ORDER — IRINOTECAN HCL CHEMO INJECTION 100 MG/5ML
180.0000 mg/m2 | Freq: Once | INTRAVENOUS | Status: AC
  Administered 2018-11-05: 14:00:00 320 mg via INTRAVENOUS
  Filled 2018-11-05: qty 5

## 2018-11-05 MED ORDER — ATROPINE SULFATE 1 MG/ML IJ SOLN
0.5000 mg | Freq: Once | INTRAMUSCULAR | Status: AC | PRN
  Administered 2018-11-05: 0.5 mg via INTRAVENOUS

## 2018-11-05 MED ORDER — DEXAMETHASONE SODIUM PHOSPHATE 10 MG/ML IJ SOLN
10.0000 mg | Freq: Once | INTRAMUSCULAR | Status: AC
  Administered 2018-11-05: 10 mg via INTRAVENOUS

## 2018-11-05 MED ORDER — HEPARIN SOD (PORK) LOCK FLUSH 100 UNIT/ML IV SOLN
500.0000 [IU] | Freq: Once | INTRAVENOUS | Status: DC | PRN
  Filled 2018-11-05: qty 5

## 2018-11-05 MED ORDER — SODIUM CHLORIDE 0.9% FLUSH
10.0000 mL | INTRAVENOUS | Status: DC | PRN
  Filled 2018-11-05: qty 10

## 2018-11-05 MED ORDER — FLUOROURACIL CHEMO INJECTION 2.5 GM/50ML
400.0000 mg/m2 | Freq: Once | INTRAVENOUS | Status: AC
  Administered 2018-11-05: 16:00:00 700 mg via INTRAVENOUS
  Filled 2018-11-05: qty 14

## 2018-11-05 MED ORDER — LEUCOVORIN CALCIUM INJECTION 350 MG
400.0000 mg/m2 | Freq: Once | INTRAVENOUS | Status: AC
  Administered 2018-11-05: 716 mg via INTRAVENOUS
  Filled 2018-11-05: qty 35.8

## 2018-11-05 MED ORDER — PALONOSETRON HCL INJECTION 0.25 MG/5ML
INTRAVENOUS | Status: AC
  Filled 2018-11-05: qty 5

## 2018-11-05 MED ORDER — DEXAMETHASONE SODIUM PHOSPHATE 10 MG/ML IJ SOLN
INTRAMUSCULAR | Status: AC
  Filled 2018-11-05: qty 1

## 2018-11-05 MED ORDER — ATROPINE SULFATE 1 MG/ML IJ SOLN
INTRAMUSCULAR | Status: AC
  Filled 2018-11-05: qty 1

## 2018-11-05 MED ORDER — SODIUM CHLORIDE 0.9 % IV SOLN
2400.0000 mg/m2 | INTRAVENOUS | Status: DC
  Administered 2018-11-05: 4300 mg via INTRAVENOUS
  Filled 2018-11-05: qty 86

## 2018-11-05 NOTE — Progress Notes (Signed)
Per Dr. Benay Spice ok to treat with [facility] today creatine 1.83.

## 2018-11-05 NOTE — Progress Notes (Signed)
Per Dr. Benay Spice: OK to treat w/Hgb 7.5. No blood product needed-patient asymptomatic

## 2018-11-05 NOTE — Patient Instructions (Signed)
[facility] Discharge Instructions for Patients Receiving Chemotherapy  Today you received the following chemotherapy agents:  Irinotecan, Leucovorin, and 5FU.   To help prevent nausea and vomiting after your treatment, we encourage you to take your nausea medication as directed.   If you develop nausea and vomiting that is not controlled by your nausea medication, call the clinic.   BELOW ARE SYMPTOMS THAT SHOULD BE REPORTED IMMEDIATELY:  *FEVER GREATER THAN 100.5 F  *CHILLS WITH OR WITHOUT FEVER  NAUSEA AND VOMITING THAT IS NOT CONTROLLED WITH YOUR NAUSEA MEDICATION  *UNUSUAL SHORTNESS OF BREATH  *UNUSUAL BRUISING OR BLEEDING  TENDERNESS IN MOUTH AND THROAT WITH OR WITHOUT PRESENCE OF ULCERS  *URINARY PROBLEMS  *BOWEL PROBLEMS  UNUSUAL RASH Items with * indicate a potential emergency and should be followed up as soon as possible.  Feel free to call the clinic should you have any questions or concerns. The clinic phone number is [phone].  Please show the CHEMO ALERT CARD at check-in to the Emergency Department and triage nurse.   

## 2018-11-05 NOTE — Progress Notes (Signed)
[facility] OFFICE PROGRESS NOTE   Diagnosis: Colon cancer  INTERVAL HISTORY:   Miguel Gamble completed another cycle of FOLFIRI on 10/23/2018.  He received atropine on day 1 after he developed abdominal cramping and flatulence.  His symptoms improved.  He reports an episode of mild diarrhea during the week following chemotherapy.  No other complaint.  He feels well.  He has mild numbness in the extremities.  Objective:  Vital signs in last 24 hours:  Blood pressure 132/71, pulse 81, temperature 99.1 F (37.3 C), temperature source Oral, resp. rate 18, height _0  (1.727 m), weight 148 lb 6.4 oz (67.3 kg), SpO2 97 %.    HEENT: No thrush or ulcers  GI: No hepatomegaly, nontender, left abdomen colostomy with prolapsed bowel Vascular: No leg edema Skin: Palms without erythema  Portacath/PICC-without erythema  Lab Results:  Lab Results  Component Value Date   WBC 3.2 (L) 11/05/2018   HGB 7.5 (L) 11/05/2018   HCT 24.3 (L) 11/05/2018   MCV 87.1 11/05/2018   PLT 165 11/05/2018   NEUTROABS 2.0 11/05/2018    CMP  Lab Results  Component Value Date   NA 141 11/05/2018   K 3.7 11/05/2018   CL 111 11/05/2018   CO2 22 11/05/2018   GLUCOSE 112 (H) 11/05/2018   BUN 16 11/05/2018   CREATININE 1.83 (H) 11/05/2018   CALCIUM 8.5 (L) 11/05/2018   PROT 6.5 11/05/2018   ALBUMIN 3.2 (L) 11/05/2018   AST 13 (L) 11/05/2018   ALT 6 11/05/2018   ALKPHOS 94 11/05/2018   BILITOT 0.4 11/05/2018   GFRNONAA 37 (L) 11/05/2018   GFRAA 43 (L) 11/05/2018    Lab Results  Component Value Date   CEA1 59.41 (H) 11/05/2018    Medications: I have reviewed the patient's current medications.   Assessment/Plan: 1.Metastatic colon cancer-abdomen/pelvic mass appears to arise from the sigmoid colon  Noncontrast CT 04/14/2018-abdomen/pelvic mass, partial colonic and small bowel obstruction, fistula between the mass and small bowel loops, multiple bladder tumors, left  hydronephrosis, bilateral lung metastases, lymphatic tumor spread in the lungs versus pneumonia, precarinal lymph node, retroperitoneal/mesenteric lymphadenopathy  Resection of multiple bladder masses 04/15/2018-adenocarcinoma consistent with a colonic primary, MSS,tumor mutation burden 10, KRAS G12D, No BRAF mutation  Cycle 1 FOLFOX 05/07/2018  Cycle 2 FOLFOX 05/22/2018  Cycle 3 FOLFOX 06/05/2018 (oxaliplatin dose reduced due to increased creatinine)  Cycle 4 FOLFOX 06/18/2018  Cycle 5 FOLFOX 07/03/2018  CT 07/09/2018-no significant change in lung nodules, resolution ofairspace disease, decreased size of liver metastases, near complete resolution of bowel obstruction, new diverting colostomy  Cycle 6 FOLFOX 07/17/2018  Cycle 7 FOLFOX 07/31/2018  Cycle 8 FOLFOX 08/14/2018  Cycle 9 FOLFOX 08/28/2018 (oxaliplatin held due to mild neutropenia)  Cycle 10 FOLFOX 09/11/2018  CT 09/22/2018-enlargement of lung nodules, stable indeterminate liver lesions, similar mass in the distal sigmoid colon, 17 mm polypoid lesion seen in the left bladder, persistent small bowel distention proximal to an area of small bowel involvement by direct tumor extension  Cycle 1 FOLFIRI 10/09/2018  Cycle 2 FOLFIRI 10/23/2018  Cycle 3 FOLFIRI 11/05/2018   2.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 He will follow-up with Dr. Ninfa Linden. Status post right inguinal hernia repair with mesh 06/23/2018.  6.Anemia, multifactorial secondary to chronic disease and blood loss; transfused 2 units of blood 05/24/2018  7.Right lower extremity deep vein thrombosis 07/17/2018-started on Xarelto     Disposition: [facility] Gamble appears stable.  He is tolerating the FOLFIRI well.  He will  complete cycle 3 today.  He will return for office visit in the next cycle of chemotherapy in 2 weeks.  He does not appear symptomatic from the anemia.  Betsy Coder, MD  11/05/2018  12:46 PM

## 2018-11-06 ENCOUNTER — Other Ambulatory Visit: Payer: Self-pay | Admitting: Urology

## 2018-11-06 ENCOUNTER — Telehealth: Payer: Self-pay | Admitting: Oncology

## 2018-11-06 NOTE — Telephone Encounter (Signed)
Called and spoke with patient. Confirmed appt  °

## 2018-11-07 ENCOUNTER — Inpatient Hospital Stay: Payer: PRIVATE HEALTH INSURANCE

## 2018-11-07 ENCOUNTER — Other Ambulatory Visit: Payer: Self-pay

## 2018-11-07 VITALS — BP 128/72 | HR 82 | Temp 98.7°F | Resp 18

## 2018-11-07 DIAGNOSIS — C187 Malignant neoplasm of sigmoid colon: Secondary | ICD-10-CM

## 2018-11-07 DIAGNOSIS — C772 Secondary and unspecified malignant neoplasm of intra-abdominal lymph nodes: Secondary | ICD-10-CM

## 2018-11-07 DIAGNOSIS — Z5111 Encounter for antineoplastic chemotherapy: Secondary | ICD-10-CM | POA: Diagnosis not present

## 2018-11-07 MED ORDER — SODIUM CHLORIDE 0.9% FLUSH
10.0000 mL | INTRAVENOUS | Status: DC | PRN
  Administered 2018-11-07: 10 mL
  Filled 2018-11-07: qty 10

## 2018-11-07 MED ORDER — HEPARIN SOD (PORK) LOCK FLUSH 100 UNIT/ML IV SOLN
500.0000 [IU] | Freq: Once | INTRAVENOUS | Status: AC | PRN
  Administered 2018-11-07: 13:00:00 500 [IU]
  Filled 2018-11-07: qty 5

## 2018-11-16 ENCOUNTER — Other Ambulatory Visit: Payer: Self-pay | Admitting: Oncology

## 2018-11-20 ENCOUNTER — Inpatient Hospital Stay: Payer: PRIVATE HEALTH INSURANCE

## 2018-11-20 ENCOUNTER — Other Ambulatory Visit: Payer: Self-pay

## 2018-11-20 ENCOUNTER — Inpatient Hospital Stay: Payer: PRIVATE HEALTH INSURANCE | Attending: Nurse Practitioner | Admitting: Oncology

## 2018-11-20 VITALS — BP 156/72 | HR 81 | Temp 98.3°F | Resp 17 | Ht 68.0 in | Wt 145.9 lb

## 2018-11-20 DIAGNOSIS — Z5111 Encounter for antineoplastic chemotherapy: Secondary | ICD-10-CM | POA: Insufficient documentation

## 2018-11-20 DIAGNOSIS — N138 Other obstructive and reflux uropathy: Secondary | ICD-10-CM | POA: Insufficient documentation

## 2018-11-20 DIAGNOSIS — C187 Malignant neoplasm of sigmoid colon: Secondary | ICD-10-CM

## 2018-11-20 DIAGNOSIS — G620 Drug-induced polyneuropathy: Secondary | ICD-10-CM | POA: Diagnosis not present

## 2018-11-20 DIAGNOSIS — Z7689 Persons encountering health services in other specified circumstances: Secondary | ICD-10-CM | POA: Insufficient documentation

## 2018-11-20 DIAGNOSIS — Z933 Colostomy status: Secondary | ICD-10-CM | POA: Insufficient documentation

## 2018-11-20 DIAGNOSIS — C772 Secondary and unspecified malignant neoplasm of intra-abdominal lymph nodes: Secondary | ICD-10-CM

## 2018-11-20 DIAGNOSIS — Z95828 Presence of other vascular implants and grafts: Secondary | ICD-10-CM

## 2018-11-20 DIAGNOSIS — C787 Secondary malignant neoplasm of liver and intrahepatic bile duct: Secondary | ICD-10-CM | POA: Diagnosis not present

## 2018-11-20 DIAGNOSIS — R630 Anorexia: Secondary | ICD-10-CM | POA: Diagnosis not present

## 2018-11-20 DIAGNOSIS — K56609 Unspecified intestinal obstruction, unspecified as to partial versus complete obstruction: Secondary | ICD-10-CM | POA: Insufficient documentation

## 2018-11-20 DIAGNOSIS — Z86718 Personal history of other venous thrombosis and embolism: Secondary | ICD-10-CM | POA: Diagnosis not present

## 2018-11-20 DIAGNOSIS — K4090 Unilateral inguinal hernia, without obstruction or gangrene, not specified as recurrent: Secondary | ICD-10-CM | POA: Diagnosis not present

## 2018-11-20 DIAGNOSIS — C189 Malignant neoplasm of colon, unspecified: Secondary | ICD-10-CM | POA: Insufficient documentation

## 2018-11-20 DIAGNOSIS — Z7901 Long term (current) use of anticoagulants: Secondary | ICD-10-CM | POA: Diagnosis not present

## 2018-11-20 DIAGNOSIS — T451X5A Adverse effect of antineoplastic and immunosuppressive drugs, initial encounter: Secondary | ICD-10-CM | POA: Diagnosis not present

## 2018-11-20 LAB — CBC WITH DIFFERENTIAL (CANCER CENTER ONLY)
Abs Immature Granulocytes: 0 10*3/uL (ref 0.00–0.07)
Basophils Absolute: 0 10*3/uL (ref 0.0–0.1)
Basophils Relative: 1 %
Eosinophils Absolute: 0.1 10*3/uL (ref 0.0–0.5)
Eosinophils Relative: 5 %
HCT: 24.1 % — ABNORMAL LOW (ref 39.0–52.0)
Hemoglobin: 7.3 g/dL — ABNORMAL LOW (ref 13.0–17.0)
Immature Granulocytes: 0 %
Lymphocytes Relative: 29 %
Lymphs Abs: 0.8 10*3/uL (ref 0.7–4.0)
MCH: 25.7 pg — ABNORMAL LOW (ref 26.0–34.0)
MCHC: 30.3 g/dL (ref 30.0–36.0)
MCV: 84.9 fL (ref 80.0–100.0)
Monocytes Absolute: 0.3 10*3/uL (ref 0.1–1.0)
Monocytes Relative: 11 %
Neutro Abs: 1.4 10*3/uL — ABNORMAL LOW (ref 1.7–7.7)
Neutrophils Relative %: 54 %
Platelet Count: 290 10*3/uL (ref 150–400)
RBC: 2.84 MIL/uL — ABNORMAL LOW (ref 4.22–5.81)
RDW: 18 % — ABNORMAL HIGH (ref 11.5–15.5)
WBC Count: 2.6 10*3/uL — ABNORMAL LOW (ref 4.0–10.5)
nRBC: 0 % (ref 0.0–0.2)

## 2018-11-20 LAB — CMP (CANCER CENTER ONLY)
ALT: 9 U/L (ref 0–44)
AST: 13 U/L — ABNORMAL LOW (ref 15–41)
Albumin: 3 g/dL — ABNORMAL LOW (ref 3.5–5.0)
Alkaline Phosphatase: 84 U/L (ref 38–126)
Anion gap: 9 (ref 5–15)
BUN: 17 mg/dL (ref 8–23)
CO2: 19 mmol/L — ABNORMAL LOW (ref 22–32)
Calcium: 8.6 mg/dL — ABNORMAL LOW (ref 8.9–10.3)
Chloride: 113 mmol/L — ABNORMAL HIGH (ref 98–111)
Creatinine: 2.03 mg/dL — ABNORMAL HIGH (ref 0.61–1.24)
GFR, Est AFR Am: 38 mL/min — ABNORMAL LOW (ref >60)
GFR, Estimated: 32 mL/min — ABNORMAL LOW (ref >60)
Glucose, Bld: 107 mg/dL — ABNORMAL HIGH (ref 70–99)
Potassium: 3.8 mmol/L (ref 3.5–5.1)
Sodium: 141 mmol/L (ref 135–145)
Total Bilirubin: 0.3 mg/dL (ref 0.3–1.2)
Total Protein: 6.4 g/dL — ABNORMAL LOW (ref 6.5–8.1)

## 2018-11-20 LAB — CEA (IN HOUSE-CHCC): CEA (CHCC-In House): 35.19 ng/mL — ABNORMAL HIGH (ref 0.00–5.00)

## 2018-11-20 MED ORDER — PALONOSETRON HCL INJECTION 0.25 MG/5ML
INTRAVENOUS | Status: AC
  Filled 2018-11-20: qty 5

## 2018-11-20 MED ORDER — LEUCOVORIN CALCIUM INJECTION 350 MG
400.0000 mg/m2 | Freq: Once | INTRAVENOUS | Status: AC
  Administered 2018-11-20: 11:00:00 716 mg via INTRAVENOUS
  Filled 2018-11-20: qty 35.8

## 2018-11-20 MED ORDER — ATROPINE SULFATE 1 MG/ML IJ SOLN
0.5000 mg | Freq: Once | INTRAMUSCULAR | Status: AC | PRN
  Administered 2018-11-20: 0.5 mg via INTRAVENOUS

## 2018-11-20 MED ORDER — PALONOSETRON HCL INJECTION 0.25 MG/5ML
0.2500 mg | Freq: Once | INTRAVENOUS | Status: AC
  Administered 2018-11-20: 0.25 mg via INTRAVENOUS

## 2018-11-20 MED ORDER — ATROPINE SULFATE 1 MG/ML IJ SOLN
INTRAMUSCULAR | Status: AC
  Filled 2018-11-20: qty 1

## 2018-11-20 MED ORDER — FLUOROURACIL CHEMO INJECTION 2.5 GM/50ML
400.0000 mg/m2 | Freq: Once | INTRAVENOUS | Status: AC
  Administered 2018-11-20: 700 mg via INTRAVENOUS
  Filled 2018-11-20: qty 14

## 2018-11-20 MED ORDER — DEXAMETHASONE SODIUM PHOSPHATE 10 MG/ML IJ SOLN
INTRAMUSCULAR | Status: AC
  Filled 2018-11-20: qty 1

## 2018-11-20 MED ORDER — IRINOTECAN HCL CHEMO INJECTION 100 MG/5ML
180.0000 mg/m2 | Freq: Once | INTRAVENOUS | Status: AC
  Administered 2018-11-20: 320 mg via INTRAVENOUS
  Filled 2018-11-20: qty 15

## 2018-11-20 MED ORDER — SODIUM CHLORIDE 0.9% FLUSH
10.0000 mL | INTRAVENOUS | Status: DC | PRN
  Administered 2018-11-20: 09:00:00 10 mL
  Filled 2018-11-20: qty 10

## 2018-11-20 MED ORDER — SODIUM CHLORIDE 0.9 % IV SOLN
2400.0000 mg/m2 | INTRAVENOUS | Status: DC
  Administered 2018-11-20: 4300 mg via INTRAVENOUS
  Filled 2018-11-20: qty 86

## 2018-11-20 MED ORDER — SODIUM CHLORIDE 0.9 % IV SOLN
Freq: Once | INTRAVENOUS | Status: AC
  Administered 2018-11-20: 10:00:00 via INTRAVENOUS
  Filled 2018-11-20: qty 250

## 2018-11-20 MED ORDER — DEXAMETHASONE SODIUM PHOSPHATE 10 MG/ML IJ SOLN
10.0000 mg | Freq: Once | INTRAMUSCULAR | Status: AC
  Administered 2018-11-20: 10 mg via INTRAVENOUS

## 2018-11-20 NOTE — Progress Notes (Signed)
Per Dr. Benay Spice: OK to treat with Hgb 7.3 and ANC 1.4. Added Udenyca to treatment plan. OK to treat w/creatinine 2.03

## 2018-11-20 NOTE — Patient Instructions (Signed)
[facility] Discharge Instructions for Patients Receiving Chemotherapy  Today you received the following chemotherapy agents:  Irinotecan, Leucovorin, and 5FU.   To help prevent nausea and vomiting after your treatment, we encourage you to take your nausea medication as directed.   If you develop nausea and vomiting that is not controlled by your nausea medication, call the clinic.   BELOW ARE SYMPTOMS THAT SHOULD BE REPORTED IMMEDIATELY:  *FEVER GREATER THAN 100.5 F  *CHILLS WITH OR WITHOUT FEVER  NAUSEA AND VOMITING THAT IS NOT CONTROLLED WITH YOUR NAUSEA MEDICATION  *UNUSUAL SHORTNESS OF BREATH  *UNUSUAL BRUISING OR BLEEDING  TENDERNESS IN MOUTH AND THROAT WITH OR WITHOUT PRESENCE OF ULCERS  *URINARY PROBLEMS  *BOWEL PROBLEMS  UNUSUAL RASH Items with * indicate a potential emergency and should be followed up as soon as possible.  Feel free to call the clinic should you have any questions or concerns. The clinic phone number is [phone].  Please show the CHEMO ALERT CARD at check-in to the Emergency Department and triage nurse.   

## 2018-11-20 NOTE — Progress Notes (Signed)
[facility] OFFICE PROGRESS NOTE   Diagnosis: Colon cancer  INTERVAL HISTORY:   [name] completed another cycle of FOLFIRI on 11/05/2018.  No nausea or diarrhea.  He feels well.  Good appetite.  No dyspnea.  He reports mild bleeding related to skin/mucosal irritation at the ostomy opening.  He continues to have variable prolapse of bowel at the ostomy.  He has contacted Dr. Ninfa Linden.  He continues to have numbness in the extremities, partially improved.  Objective:  Vital signs in last 24 hours:  Blood pressure (!) 156/72, pulse 81, temperature 98.3 F (36.8 C), temperature source Oral, resp. rate 17, height 5' 8"  (1.727 m), weight 145 lb 14.4 oz (66.2 kg), SpO2 100 %.     GI: No hepatomegaly, nontender, left lower quadrant colostomy with prolapsed bowel, small amount of old blood at the rim of the appliance, no active bleeding Vascular: No leg edema    Portacath/PICC-without erythema  Lab Results:  Lab Results  Component Value Date   WBC 2.6 (L) 11/20/2018   HGB 7.3 (L) 11/20/2018   HCT 24.1 (L) 11/20/2018   MCV 84.9 11/20/2018   PLT 290 11/20/2018   NEUTROABS 1.4 (L) 11/20/2018    CMP  Lab Results  Component Value Date   NA 141 11/05/2018   K 3.7 11/05/2018   CL 111 11/05/2018   CO2 22 11/05/2018   GLUCOSE 112 (H) 11/05/2018   BUN 16 11/05/2018   CREATININE 1.83 (H) 11/05/2018   CALCIUM 8.5 (L) 11/05/2018   PROT 6.5 11/05/2018   ALBUMIN 3.2 (L) 11/05/2018   AST 13 (L) 11/05/2018   ALT 6 11/05/2018   ALKPHOS 94 11/05/2018   BILITOT 0.4 11/05/2018   GFRNONAA 37 (L) 11/05/2018   GFRAA 43 (L) 11/05/2018    Lab Results  Component Value Date   CEA1 59.41 (H) 11/05/2018     Medications: I have reviewed the patient's current medications.   Assessment/Plan: 1. Metastatic colon cancer-abdomen/pelvic mass appears to arise from the sigmoid colon  Noncontrast CT 04/14/2018-abdomen/pelvic mass, partial colonic and small bowel obstruction,  fistula between the mass and small bowel loops, multiple bladder tumors, left hydronephrosis, bilateral lung metastases, lymphatic tumor spread in the lungs versus pneumonia, precarinal lymph node, retroperitoneal/mesenteric lymphadenopathy  Resection of multiple bladder masses 04/15/2018-adenocarcinoma consistent with a colonic primary, MSS,tumor mutation burden 10, KRAS G12D, No BRAF mutation  Cycle 1 FOLFOX 05/07/2018  Cycle 2 FOLFOX 05/22/2018  Cycle 3 FOLFOX 06/05/2018 (oxaliplatin dose reduced due to increased creatinine)  Cycle 4 FOLFOX 06/18/2018  Cycle 5 FOLFOX 07/03/2018  CT 07/09/2018-no significant change in lung nodules, resolution ofairspace disease, decreased size of liver metastases, near complete resolution of bowel obstruction, new diverting colostomy  Cycle 6 FOLFOX 07/17/2018  Cycle 7 FOLFOX 07/31/2018  Cycle 8 FOLFOX 08/14/2018  Cycle 9 FOLFOX 08/28/2018 (oxaliplatin held due to mild neutropenia)  Cycle 10 FOLFOX 09/11/2018  CT 09/22/2018-enlargement of lung nodules, stable indeterminate liver lesions, similar mass in the distal sigmoid colon, 17 mm polypoid lesion seen in the left bladder, persistent small bowel distention proximal to an area of small bowel involvement by direct tumor extension  Cycle 1 FOLFIRI 10/09/2018  Cycle 2 FOLFIRI 10/23/2018  Cycle 3 FOLFIRI 11/05/2018  Cycle 4 FOLFIRI 11/20/2018-Udenyca added   2.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 He will follow-up  with Dr. Ninfa Linden. Status post right inguinal hernia repair with mesh 06/23/2018.  6.Anemia, multifactorial secondary to chronic disease and blood loss; transfused 2 units of blood 05/24/2018  7.Right lower extremity deep vein thrombosis  07/17/2018-started on Xarelto  8.  Oxaliplatin neuropathy   Disposition: [name] appears stable.  He has mild neutropenia.  He will completed another cycle of FOLFIRI today.  G-CSF will be added with this cycle.  The small amount of bleeding at the ostomy site is likely related to skin and mucosal irritation from the prolapse.  He will call for increased bleeding.  He plans to follow-up with Dr. Ninfa Linden for evaluation of the ostomy site.  He will complete cycle 4 of FOLFIRI today.  He will undergo a restaging CT after cycle 5.  [name] appears asymptomatic from anemia.  He will return for an office visit and chemotherapy in 2 weeks.  Betsy Coder, MD  11/20/2018  9:20 AM

## 2018-11-22 ENCOUNTER — Inpatient Hospital Stay: Payer: PRIVATE HEALTH INSURANCE

## 2018-11-22 ENCOUNTER — Other Ambulatory Visit: Payer: Self-pay

## 2018-11-22 VITALS — BP 145/66 | HR 80 | Temp 98.7°F | Resp 18

## 2018-11-22 DIAGNOSIS — C187 Malignant neoplasm of sigmoid colon: Secondary | ICD-10-CM

## 2018-11-22 DIAGNOSIS — Z5111 Encounter for antineoplastic chemotherapy: Secondary | ICD-10-CM | POA: Diagnosis not present

## 2018-11-22 MED ORDER — HEPARIN SOD (PORK) LOCK FLUSH 100 UNIT/ML IV SOLN
500.0000 [IU] | Freq: Once | INTRAVENOUS | Status: AC | PRN
  Administered 2018-11-22: 500 [IU]
  Filled 2018-11-22: qty 5

## 2018-11-22 MED ORDER — PEGFILGRASTIM-CBQV 6 MG/0.6ML ~~LOC~~ SOSY
6.0000 mg | PREFILLED_SYRINGE | Freq: Once | SUBCUTANEOUS | Status: AC
  Administered 2018-11-22: 6 mg via SUBCUTANEOUS

## 2018-11-22 MED ORDER — PEGFILGRASTIM-CBQV 6 MG/0.6ML ~~LOC~~ SOSY
PREFILLED_SYRINGE | SUBCUTANEOUS | Status: AC
  Filled 2018-11-22: qty 0.6

## 2018-11-22 MED ORDER — SODIUM CHLORIDE 0.9% FLUSH
10.0000 mL | INTRAVENOUS | Status: DC | PRN
  Administered 2018-11-22: 10 mL
  Filled 2018-11-22: qty 10

## 2018-11-22 NOTE — Patient Instructions (Signed)

## 2018-11-30 ENCOUNTER — Other Ambulatory Visit: Payer: Self-pay | Admitting: Oncology

## 2018-12-04 ENCOUNTER — Inpatient Hospital Stay: Payer: PRIVATE HEALTH INSURANCE

## 2018-12-04 ENCOUNTER — Other Ambulatory Visit: Payer: Self-pay

## 2018-12-04 ENCOUNTER — Inpatient Hospital Stay (HOSPITAL_BASED_OUTPATIENT_CLINIC_OR_DEPARTMENT_OTHER): Payer: PRIVATE HEALTH INSURANCE | Admitting: Oncology

## 2018-12-04 VITALS — BP 145/74 | HR 72 | Temp 98.7°F | Ht 68.0 in | Wt 149.4 lb

## 2018-12-04 DIAGNOSIS — C187 Malignant neoplasm of sigmoid colon: Secondary | ICD-10-CM

## 2018-12-04 DIAGNOSIS — C772 Secondary and unspecified malignant neoplasm of intra-abdominal lymph nodes: Secondary | ICD-10-CM

## 2018-12-04 DIAGNOSIS — Z95828 Presence of other vascular implants and grafts: Secondary | ICD-10-CM

## 2018-12-04 DIAGNOSIS — Z5111 Encounter for antineoplastic chemotherapy: Secondary | ICD-10-CM | POA: Diagnosis not present

## 2018-12-04 LAB — CBC WITH DIFFERENTIAL (CANCER CENTER ONLY)
Abs Immature Granulocytes: 0.52 10*3/uL — ABNORMAL HIGH (ref 0.00–0.07)
Basophils Absolute: 0.1 10*3/uL (ref 0.0–0.1)
Basophils Relative: 1 %
Eosinophils Absolute: 0.1 10*3/uL (ref 0.0–0.5)
Eosinophils Relative: 1 %
HCT: 24.2 % — ABNORMAL LOW (ref 39.0–52.0)
Hemoglobin: 7.4 g/dL — ABNORMAL LOW (ref 13.0–17.0)
Immature Granulocytes: 5 %
Lymphocytes Relative: 16 %
Lymphs Abs: 1.8 10*3/uL (ref 0.7–4.0)
MCH: 26 pg (ref 26.0–34.0)
MCHC: 30.6 g/dL (ref 30.0–36.0)
MCV: 84.9 fL (ref 80.0–100.0)
Monocytes Absolute: 1.2 10*3/uL — ABNORMAL HIGH (ref 0.1–1.0)
Monocytes Relative: 11 %
Neutro Abs: 7.3 10*3/uL (ref 1.7–7.7)
Neutrophils Relative %: 66 %
Platelet Count: 172 10*3/uL (ref 150–400)
RBC: 2.85 MIL/uL — ABNORMAL LOW (ref 4.22–5.81)
RDW: 20.6 % — ABNORMAL HIGH (ref 11.5–15.5)
WBC Count: 11.1 10*3/uL — ABNORMAL HIGH (ref 4.0–10.5)
nRBC: 0.9 % — ABNORMAL HIGH (ref 0.0–0.2)

## 2018-12-04 LAB — CMP (CANCER CENTER ONLY)
ALT: 8 U/L (ref 0–44)
AST: 14 U/L — ABNORMAL LOW (ref 15–41)
Albumin: 3.2 g/dL — ABNORMAL LOW (ref 3.5–5.0)
Alkaline Phosphatase: 116 U/L (ref 38–126)
Anion gap: 10 (ref 5–15)
BUN: 16 mg/dL (ref 8–23)
CO2: 21 mmol/L — ABNORMAL LOW (ref 22–32)
Calcium: 8.3 mg/dL — ABNORMAL LOW (ref 8.9–10.3)
Chloride: 111 mmol/L (ref 98–111)
Creatinine: 1.94 mg/dL — ABNORMAL HIGH (ref 0.61–1.24)
GFR, Est AFR Am: 40 mL/min — ABNORMAL LOW (ref >60)
GFR, Estimated: 34 mL/min — ABNORMAL LOW (ref >60)
Glucose, Bld: 107 mg/dL — ABNORMAL HIGH (ref 70–99)
Potassium: 3.4 mmol/L — ABNORMAL LOW (ref 3.5–5.1)
Sodium: 142 mmol/L (ref 135–145)
Total Bilirubin: 0.4 mg/dL (ref 0.3–1.2)
Total Protein: 6.3 g/dL — ABNORMAL LOW (ref 6.5–8.1)

## 2018-12-04 LAB — CEA (IN HOUSE-CHCC): CEA (CHCC-In House): 37.8 ng/mL — ABNORMAL HIGH (ref 0.00–5.00)

## 2018-12-04 MED ORDER — SODIUM CHLORIDE 0.9 % IV SOLN
Freq: Once | INTRAVENOUS | Status: AC
  Administered 2018-12-04: 13:00:00 via INTRAVENOUS
  Filled 2018-12-04: qty 250

## 2018-12-04 MED ORDER — HEPARIN SOD (PORK) LOCK FLUSH 100 UNIT/ML IV SOLN
500.0000 [IU] | Freq: Once | INTRAVENOUS | Status: DC | PRN
  Filled 2018-12-04: qty 5

## 2018-12-04 MED ORDER — FLUOROURACIL CHEMO INJECTION 2.5 GM/50ML
400.0000 mg/m2 | Freq: Once | INTRAVENOUS | Status: AC
  Administered 2018-12-04: 15:00:00 700 mg via INTRAVENOUS
  Filled 2018-12-04: qty 14

## 2018-12-04 MED ORDER — SODIUM CHLORIDE 0.9 % IV SOLN
Freq: Once | INTRAVENOUS | Status: AC
  Administered 2018-12-04: 12:00:00 via INTRAVENOUS
  Filled 2018-12-04: qty 250

## 2018-12-04 MED ORDER — IRINOTECAN HCL CHEMO INJECTION 100 MG/5ML
180.0000 mg/m2 | Freq: Once | INTRAVENOUS | Status: AC
  Administered 2018-12-04: 320 mg via INTRAVENOUS
  Filled 2018-12-04: qty 15

## 2018-12-04 MED ORDER — PALONOSETRON HCL INJECTION 0.25 MG/5ML
0.2500 mg | Freq: Once | INTRAVENOUS | Status: AC
  Administered 2018-12-04: 12:00:00 0.25 mg via INTRAVENOUS

## 2018-12-04 MED ORDER — SODIUM CHLORIDE 0.9% FLUSH
10.0000 mL | Freq: Once | INTRAVENOUS | Status: AC
  Administered 2018-12-04: 10 mL
  Filled 2018-12-04: qty 10

## 2018-12-04 MED ORDER — DEXAMETHASONE SODIUM PHOSPHATE 10 MG/ML IJ SOLN
10.0000 mg | Freq: Once | INTRAMUSCULAR | Status: AC
  Administered 2018-12-04: 10 mg via INTRAVENOUS

## 2018-12-04 MED ORDER — SODIUM CHLORIDE 0.9% FLUSH
10.0000 mL | INTRAVENOUS | Status: DC | PRN
  Filled 2018-12-04: qty 10

## 2018-12-04 MED ORDER — ATROPINE SULFATE 1 MG/ML IJ SOLN
INTRAMUSCULAR | Status: AC
  Filled 2018-12-04: qty 1

## 2018-12-04 MED ORDER — PALONOSETRON HCL INJECTION 0.25 MG/5ML
INTRAVENOUS | Status: AC
  Filled 2018-12-04: qty 5

## 2018-12-04 MED ORDER — ATROPINE SULFATE 1 MG/ML IJ SOLN
0.5000 mg | Freq: Once | INTRAMUSCULAR | Status: AC | PRN
  Administered 2018-12-04: 0.5 mg via INTRAVENOUS

## 2018-12-04 MED ORDER — SODIUM CHLORIDE 0.9 % IV SOLN
2400.0000 mg/m2 | INTRAVENOUS | Status: DC
  Administered 2018-12-04: 4300 mg via INTRAVENOUS
  Filled 2018-12-04: qty 86

## 2018-12-04 MED ORDER — LEUCOVORIN CALCIUM INJECTION 350 MG
400.0000 mg/m2 | Freq: Once | INTRAVENOUS | Status: AC
  Administered 2018-12-04: 13:00:00 716 mg via INTRAVENOUS
  Filled 2018-12-04: qty 35.8

## 2018-12-04 MED ORDER — DEXAMETHASONE SODIUM PHOSPHATE 10 MG/ML IJ SOLN
INTRAMUSCULAR | Status: AC
  Filled 2018-12-04: qty 1

## 2018-12-04 NOTE — Progress Notes (Signed)
Per Dr. Benay Spice, patient is OK to treat today with today's labs.

## 2018-12-04 NOTE — Patient Instructions (Signed)
[facility] Discharge Instructions for Patients Receiving Chemotherapy  Today you received the following chemotherapy agents:  Irinotecan, Leucovorin, and 5FU.   To help prevent nausea and vomiting after your treatment, we encourage you to take your nausea medication as directed.   If you develop nausea and vomiting that is not controlled by your nausea medication, call the clinic.   BELOW ARE SYMPTOMS THAT SHOULD BE REPORTED IMMEDIATELY:  *FEVER GREATER THAN 100.5 F  *CHILLS WITH OR WITHOUT FEVER  NAUSEA AND VOMITING THAT IS NOT CONTROLLED WITH YOUR NAUSEA MEDICATION  *UNUSUAL SHORTNESS OF BREATH  *UNUSUAL BRUISING OR BLEEDING  TENDERNESS IN MOUTH AND THROAT WITH OR WITHOUT PRESENCE OF ULCERS  *URINARY PROBLEMS  *BOWEL PROBLEMS  UNUSUAL RASH Items with * indicate a potential emergency and should be followed up as soon as possible.  Feel free to call the clinic should you have any questions or concerns. The clinic phone number is [phone].  Please show the CHEMO ALERT CARD at check-in to the Emergency Department and triage nurse.   

## 2018-12-04 NOTE — Progress Notes (Signed)
[facility] OFFICE PROGRESS NOTE   Diagnosis: Colon cancer  INTERVAL HISTORY:   Miguel Gamble completed another cycle of FOLFIRI on 11/20/2018.  No nausea or diarrhea.  No bleeding or symptom of thrombosis.  He feels well.  Good appetite.  He saw surgery to evaluate the prolapsed ostomy.  Neuropathy symptoms are improving.  Objective:  Vital signs in last 24 hours:  Blood pressure (!) 145/74, pulse 72, temperature 98.7 F (37.1 C), temperature source Oral, height 5' 8"  (1.727 m), weight 149 lb 6.4 oz (67.8 kg), SpO2 100 %.   Limited physical examination secondary to distancing with a COVID pandemic GI: Hepatomegaly, nontender, left lower quadrant colostomy with prolapsed bowel Vascular: No leg edema  Skin: Palms without erythema  Portacath/PICC-without erythema  Lab Results:  Lab Results  Component Value Date   WBC 11.1 (H) 12/04/2018   HGB 7.4 (L) 12/04/2018   HCT 24.2 (L) 12/04/2018   MCV 84.9 12/04/2018   PLT 172 12/04/2018   NEUTROABS 7.3 12/04/2018    CMP  Lab Results  Component Value Date   NA 142 12/04/2018   K 3.4 (L) 12/04/2018   CL 111 12/04/2018   CO2 21 (L) 12/04/2018   GLUCOSE 107 (H) 12/04/2018   BUN 16 12/04/2018   CREATININE 1.94 (H) 12/04/2018   CALCIUM 8.3 (L) 12/04/2018   PROT 6.3 (L) 12/04/2018   ALBUMIN 3.2 (L) 12/04/2018   AST 14 (L) 12/04/2018   ALT 8 12/04/2018   ALKPHOS 116 12/04/2018   BILITOT 0.4 12/04/2018   GFRNONAA 34 (L) 12/04/2018   GFRAA 40 (L) 12/04/2018    Lab Results  Component Value Date   CEA1 35.19 (H) 11/20/2018     Medications: I have reviewed the patient's current medications.   Assessment/Plan:  1. Metastatic colon cancer-abdomen/pelvic mass appears to arise from the sigmoid colon  Noncontrast CT 04/14/2018-abdomen/pelvic mass, partial colonic and small bowel obstruction, fistula between the mass and small bowel loops, multiple bladder tumors, left hydronephrosis, bilateral lung metastases,  lymphatic tumor spread in the lungs versus pneumonia, precarinal lymph node, retroperitoneal/mesenteric lymphadenopathy  Resection of multiple bladder masses 04/15/2018-adenocarcinoma consistent with a colonic primary, MSS,tumor mutation burden 10, KRAS G12D, No BRAF mutation  Cycle 1 FOLFOX 05/07/2018  Cycle 2 FOLFOX 05/22/2018  Cycle 3 FOLFOX 06/05/2018 (oxaliplatin dose reduced due to increased creatinine)  Cycle 4 FOLFOX 06/18/2018  Cycle 5 FOLFOX 07/03/2018  CT 07/09/2018-no significant change in lung nodules, resolution ofairspace disease, decreased size of liver metastases, near complete resolution of bowel obstruction, new diverting colostomy  Cycle 6 FOLFOX 07/17/2018  Cycle 7 FOLFOX 07/31/2018  Cycle 8 FOLFOX 08/14/2018  Cycle 9 FOLFOX 08/28/2018 (oxaliplatin held due to mild neutropenia)  Cycle 10 FOLFOX 09/11/2018  CT 09/22/2018-enlargement of lung nodules, stable indeterminate liver lesions, similar mass in the distal sigmoid colon, 17 mm polypoid lesion seen in the left bladder, persistent small bowel distention proximal to an area of small bowel involvement by direct tumor extension  Cycle 1 FOLFIRI 10/09/2018  Cycle 2 FOLFIRI 10/23/2018  Cycle 3 FOLFIRI 11/05/2018  Cycle 4 FOLFIRI 11/20/2018-Udenyca added  Cycle 5 FOLFIRI 12/04/2018   2.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 He will follow-up with Dr. Ninfa Linden. Status post right inguinal hernia repair with mesh 06/23/2018.  6.Anemia, multifactorial secondary to chronic disease and blood loss; transfused  2 units of blood 05/24/2018  7.Right lower extremity deep vein thrombosis 07/17/2018-started on Xarelto  8.  Oxaliplatin neuropathy     Disposition: Miguel Gamble appears  stable.  He will complete another cycle of FOLFIRI today.  He will undergo restaging CTs prior to an office visit in 2 weeks.  He is scheduled for evaluation of the ureter stents by Dr. Alinda Money next month.  Betsy Coder, MD  12/04/2018  12:05 PM

## 2018-12-06 ENCOUNTER — Inpatient Hospital Stay: Payer: PRIVATE HEALTH INSURANCE

## 2018-12-06 ENCOUNTER — Other Ambulatory Visit: Payer: Self-pay

## 2018-12-06 VITALS — BP 142/72 | HR 72 | Temp 98.7°F | Resp 18

## 2018-12-06 DIAGNOSIS — C187 Malignant neoplasm of sigmoid colon: Secondary | ICD-10-CM

## 2018-12-06 DIAGNOSIS — Z5111 Encounter for antineoplastic chemotherapy: Secondary | ICD-10-CM | POA: Diagnosis not present

## 2018-12-06 MED ORDER — HEPARIN SOD (PORK) LOCK FLUSH 100 UNIT/ML IV SOLN
500.0000 [IU] | Freq: Once | INTRAVENOUS | Status: AC | PRN
  Administered 2018-12-06: 500 [IU]
  Filled 2018-12-06: qty 5

## 2018-12-06 MED ORDER — PEGFILGRASTIM-CBQV 6 MG/0.6ML ~~LOC~~ SOSY
PREFILLED_SYRINGE | SUBCUTANEOUS | Status: AC
  Filled 2018-12-06: qty 0.6

## 2018-12-06 MED ORDER — SODIUM CHLORIDE 0.9% FLUSH
10.0000 mL | INTRAVENOUS | Status: DC | PRN
  Administered 2018-12-06: 10 mL
  Filled 2018-12-06: qty 10

## 2018-12-06 MED ORDER — PEGFILGRASTIM-CBQV 6 MG/0.6ML ~~LOC~~ SOSY
6.0000 mg | PREFILLED_SYRINGE | Freq: Once | SUBCUTANEOUS | Status: AC
  Administered 2018-12-06: 13:00:00 6 mg via SUBCUTANEOUS

## 2018-12-06 NOTE — Patient Instructions (Signed)

## 2018-12-09 ENCOUNTER — Telehealth: Payer: Self-pay | Admitting: *Deleted

## 2018-12-09 NOTE — Telephone Encounter (Signed)
Call from [place] requesting MD change CT order to with contrast. Per Dr. Benay Spice: He should NOT receive oral contrast: high creatinine. OK for oral contrast, but not IV>

## 2018-12-13 ENCOUNTER — Other Ambulatory Visit: Payer: Self-pay | Admitting: Oncology

## 2018-12-15 ENCOUNTER — Telehealth: Payer: Self-pay

## 2018-12-15 NOTE — Telephone Encounter (Signed)
Patient called back as requested and given contact information about financial resource department. No further questions at this time

## 2018-12-16 ENCOUNTER — Ambulatory Visit
Admission: RE | Admit: 2018-12-16 | Discharge: 2018-12-16 | Disposition: A | Discharge status: Home / Self Care | Payer: PRIVATE HEALTH INSURANCE | Source: Ambulatory Visit | Attending: Oncology | Admitting: Oncology

## 2018-12-16 ENCOUNTER — Other Ambulatory Visit: Payer: Self-pay

## 2018-12-16 DIAGNOSIS — C187 Malignant neoplasm of sigmoid colon: Secondary | ICD-10-CM

## 2018-12-18 ENCOUNTER — Inpatient Hospital Stay: Payer: PRIVATE HEALTH INSURANCE

## 2018-12-18 ENCOUNTER — Other Ambulatory Visit: Payer: Self-pay

## 2018-12-18 ENCOUNTER — Encounter: Payer: Self-pay | Admitting: Nurse Practitioner

## 2018-12-18 ENCOUNTER — Inpatient Hospital Stay: Payer: PRIVATE HEALTH INSURANCE | Attending: Nurse Practitioner | Admitting: Nurse Practitioner

## 2018-12-18 VITALS — BP 130/65 | HR 74 | Temp 98.2°F | Resp 17 | Ht 68.0 in | Wt 149.3 lb

## 2018-12-18 DIAGNOSIS — K449 Diaphragmatic hernia without obstruction or gangrene: Secondary | ICD-10-CM | POA: Insufficient documentation

## 2018-12-18 DIAGNOSIS — T451X5A Adverse effect of antineoplastic and immunosuppressive drugs, initial encounter: Secondary | ICD-10-CM | POA: Diagnosis not present

## 2018-12-18 DIAGNOSIS — C772 Secondary and unspecified malignant neoplasm of intra-abdominal lymph nodes: Secondary | ICD-10-CM

## 2018-12-18 DIAGNOSIS — Z5111 Encounter for antineoplastic chemotherapy: Secondary | ICD-10-CM | POA: Diagnosis present

## 2018-12-18 DIAGNOSIS — R630 Anorexia: Secondary | ICD-10-CM | POA: Diagnosis not present

## 2018-12-18 DIAGNOSIS — Z933 Colostomy status: Secondary | ICD-10-CM | POA: Diagnosis not present

## 2018-12-18 DIAGNOSIS — Z7689 Persons encountering health services in other specified circumstances: Secondary | ICD-10-CM | POA: Insufficient documentation

## 2018-12-18 DIAGNOSIS — D649 Anemia, unspecified: Secondary | ICD-10-CM | POA: Diagnosis not present

## 2018-12-18 DIAGNOSIS — G620 Drug-induced polyneuropathy: Secondary | ICD-10-CM | POA: Insufficient documentation

## 2018-12-18 DIAGNOSIS — C787 Secondary malignant neoplasm of liver and intrahepatic bile duct: Secondary | ICD-10-CM | POA: Insufficient documentation

## 2018-12-18 DIAGNOSIS — Z86718 Personal history of other venous thrombosis and embolism: Secondary | ICD-10-CM | POA: Insufficient documentation

## 2018-12-18 DIAGNOSIS — C189 Malignant neoplasm of colon, unspecified: Secondary | ICD-10-CM | POA: Insufficient documentation

## 2018-12-18 DIAGNOSIS — C7802 Secondary malignant neoplasm of left lung: Secondary | ICD-10-CM | POA: Insufficient documentation

## 2018-12-18 DIAGNOSIS — C187 Malignant neoplasm of sigmoid colon: Secondary | ICD-10-CM

## 2018-12-18 DIAGNOSIS — Z23 Encounter for immunization: Secondary | ICD-10-CM | POA: Diagnosis not present

## 2018-12-18 DIAGNOSIS — Z95828 Presence of other vascular implants and grafts: Secondary | ICD-10-CM

## 2018-12-18 DIAGNOSIS — K4090 Unilateral inguinal hernia, without obstruction or gangrene, not specified as recurrent: Secondary | ICD-10-CM | POA: Insufficient documentation

## 2018-12-18 DIAGNOSIS — R634 Abnormal weight loss: Secondary | ICD-10-CM | POA: Diagnosis not present

## 2018-12-18 LAB — CMP (CANCER CENTER ONLY)
ALT: 8 U/L (ref 0–44)
AST: 14 U/L — ABNORMAL LOW (ref 15–41)
Albumin: 3.3 g/dL — ABNORMAL LOW (ref 3.5–5.0)
Alkaline Phosphatase: 118 U/L (ref 38–126)
Anion gap: 7 (ref 5–15)
BUN: 16 mg/dL (ref 8–23)
CO2: 21 mmol/L — ABNORMAL LOW (ref 22–32)
Calcium: 8.3 mg/dL — ABNORMAL LOW (ref 8.9–10.3)
Chloride: 114 mmol/L — ABNORMAL HIGH (ref 98–111)
Creatinine: 2.01 mg/dL — ABNORMAL HIGH (ref 0.61–1.24)
GFR, Est AFR Am: 38 mL/min — ABNORMAL LOW (ref >60)
GFR, Estimated: 33 mL/min — ABNORMAL LOW (ref >60)
Glucose, Bld: 103 mg/dL — ABNORMAL HIGH (ref 70–99)
Potassium: 3.9 mmol/L (ref 3.5–5.1)
Sodium: 142 mmol/L (ref 135–145)
Total Bilirubin: 0.3 mg/dL (ref 0.3–1.2)
Total Protein: 6.3 g/dL — ABNORMAL LOW (ref 6.5–8.1)

## 2018-12-18 LAB — CBC WITH DIFFERENTIAL (CANCER CENTER ONLY)
Abs Immature Granulocytes: 0.39 10*3/uL — ABNORMAL HIGH (ref 0.00–0.07)
Basophils Absolute: 0.1 10*3/uL (ref 0.0–0.1)
Basophils Relative: 1 %
Eosinophils Absolute: 0.1 10*3/uL (ref 0.0–0.5)
Eosinophils Relative: 1 %
HCT: 25 % — ABNORMAL LOW (ref 39.0–52.0)
Hemoglobin: 7.6 g/dL — ABNORMAL LOW (ref 13.0–17.0)
Immature Granulocytes: 3 %
Lymphocytes Relative: 14 %
Lymphs Abs: 1.6 10*3/uL (ref 0.7–4.0)
MCH: 26.1 pg (ref 26.0–34.0)
MCHC: 30.4 g/dL (ref 30.0–36.0)
MCV: 85.9 fL (ref 80.0–100.0)
Monocytes Absolute: 1.4 10*3/uL — ABNORMAL HIGH (ref 0.1–1.0)
Monocytes Relative: 12 %
Neutro Abs: 8.3 10*3/uL — ABNORMAL HIGH (ref 1.7–7.7)
Neutrophils Relative %: 69 %
Platelet Count: 264 10*3/uL (ref 150–400)
RBC: 2.91 MIL/uL — ABNORMAL LOW (ref 4.22–5.81)
RDW: 21.1 % — ABNORMAL HIGH (ref 11.5–15.5)
WBC Count: 11.9 10*3/uL — ABNORMAL HIGH (ref 4.0–10.5)
nRBC: 0.5 % — ABNORMAL HIGH (ref 0.0–0.2)

## 2018-12-18 LAB — CEA (IN HOUSE-CHCC): CEA (CHCC-In House): 35.85 ng/mL — ABNORMAL HIGH (ref 0.00–5.00)

## 2018-12-18 MED ORDER — SODIUM CHLORIDE 0.9% FLUSH
10.0000 mL | INTRAVENOUS | Status: DC | PRN
  Filled 2018-12-18: qty 10

## 2018-12-18 MED ORDER — SODIUM CHLORIDE 0.9 % IV SOLN
Freq: Once | INTRAVENOUS | Status: AC
  Administered 2018-12-18: 11:00:00 via INTRAVENOUS
  Filled 2018-12-18: qty 250

## 2018-12-18 MED ORDER — PALONOSETRON HCL INJECTION 0.25 MG/5ML
0.2500 mg | Freq: Once | INTRAVENOUS | Status: AC
  Administered 2018-12-18: 0.25 mg via INTRAVENOUS

## 2018-12-18 MED ORDER — DEXAMETHASONE SODIUM PHOSPHATE 10 MG/ML IJ SOLN
INTRAMUSCULAR | Status: AC
  Filled 2018-12-18: qty 1

## 2018-12-18 MED ORDER — HEPARIN SOD (PORK) LOCK FLUSH 100 UNIT/ML IV SOLN
500.0000 [IU] | Freq: Once | INTRAVENOUS | Status: DC | PRN
  Filled 2018-12-18: qty 5

## 2018-12-18 MED ORDER — PALONOSETRON HCL INJECTION 0.25 MG/5ML
INTRAVENOUS | Status: AC
  Filled 2018-12-18: qty 5

## 2018-12-18 MED ORDER — ATROPINE SULFATE 1 MG/ML IJ SOLN
0.5000 mg | Freq: Once | INTRAMUSCULAR | Status: AC | PRN
  Administered 2018-12-18: 0.5 mg via INTRAVENOUS

## 2018-12-18 MED ORDER — IRINOTECAN HCL CHEMO INJECTION 100 MG/5ML
180.0000 mg/m2 | Freq: Once | INTRAVENOUS | Status: AC
  Administered 2018-12-18: 320 mg via INTRAVENOUS
  Filled 2018-12-18: qty 15

## 2018-12-18 MED ORDER — ATROPINE SULFATE 1 MG/ML IJ SOLN
INTRAMUSCULAR | Status: AC
  Filled 2018-12-18: qty 1

## 2018-12-18 MED ORDER — SODIUM CHLORIDE 0.9 % IV SOLN
Freq: Once | INTRAVENOUS | Status: AC
  Administered 2018-12-18: 10:00:00 via INTRAVENOUS
  Filled 2018-12-18: qty 250

## 2018-12-18 MED ORDER — LEUCOVORIN CALCIUM INJECTION 350 MG
400.0000 mg/m2 | Freq: Once | INTRAVENOUS | Status: AC
  Administered 2018-12-18: 12:00:00 716 mg via INTRAVENOUS
  Filled 2018-12-18: qty 35.8

## 2018-12-18 MED ORDER — FLUOROURACIL CHEMO INJECTION 2.5 GM/50ML
400.0000 mg/m2 | Freq: Once | INTRAVENOUS | Status: AC
  Administered 2018-12-18: 13:00:00 700 mg via INTRAVENOUS
  Filled 2018-12-18: qty 14

## 2018-12-18 MED ORDER — SODIUM CHLORIDE 0.9 % IV SOLN
2400.0000 mg/m2 | INTRAVENOUS | Status: DC
  Administered 2018-12-18: 13:00:00 4300 mg via INTRAVENOUS
  Filled 2018-12-18: qty 86

## 2018-12-18 MED ORDER — DEXAMETHASONE SODIUM PHOSPHATE 10 MG/ML IJ SOLN
10.0000 mg | Freq: Once | INTRAMUSCULAR | Status: AC
  Administered 2018-12-18: 10:00:00 10 mg via INTRAVENOUS

## 2018-12-18 MED ORDER — SODIUM CHLORIDE 0.9% FLUSH
10.0000 mL | INTRAVENOUS | Status: DC | PRN
  Administered 2018-12-18: 10 mL
  Filled 2018-12-18: qty 10

## 2018-12-18 NOTE — Progress Notes (Signed)
[facility] OFFICE PROGRESS NOTE   Diagnosis: Colon cancer  INTERVAL HISTORY:   [name] returns as scheduled.  He completed cycle 5 FOLFIRI 12/04/2018.  He denies nausea/vomiting.  No mouth sores.  Some loose stools after the contrast.  He has a good appetite.  He denies pain.  No bleeding. No pain following the Udenyca injection.  Objective:  Vital signs in last 24 hours:  Blood pressure 130/65, pulse 74, temperature 98.2 F (36.8 C), temperature source Oral, resp. rate 17, height 5' 8" (1.727 m), weight 149 lb 4.8 oz (67.7 kg), SpO2 100 %.    HEENT: No thrush or ulcers. GI: Abdomen soft and nontender.  No hepatomegaly.  Left lower quadrant colostomy. Vascular: No leg edema. Neuro: Alert and oriented. Skin: Palms without erythema. Port-A-Cath without erythema.   Lab Results:  Lab Results  Component Value Date   WBC 11.9 (H) 12/18/2018   HGB 7.6 (L) 12/18/2018   HCT 25.0 (L) 12/18/2018   MCV 85.9 12/18/2018   PLT 264 12/18/2018   NEUTROABS 8.3 (H) 12/18/2018    Imaging:  No results found.  Medications: I have reviewed the patient's current medications.  Assessment/Plan:  1. Metastatic colon cancer-abdomen/pelvic mass appears to arise from the sigmoid colon  Noncontrast CT 04/14/2018-abdomen/pelvic mass, partial colonic and small bowel obstruction, fistula between the mass and small bowel loops, multiple bladder tumors, left hydronephrosis, bilateral lung metastases, lymphatic tumor spread in the lungs versus pneumonia, precarinal lymph node, retroperitoneal/mesenteric lymphadenopathy  Resection of multiple bladder masses 04/15/2018-adenocarcinoma consistent with a colonic primary, MSS,tumor mutation burden 10, KRAS G12D, No BRAF mutation  Cycle 1 FOLFOX 05/07/2018  Cycle 2 FOLFOX 05/22/2018  Cycle 3 FOLFOX 06/05/2018 (oxaliplatin dose reduced due to increased creatinine)  Cycle 4 FOLFOX 06/18/2018  Cycle 5 FOLFOX 07/03/2018  CT 07/09/2018-no  significant change in lung nodules, resolution ofairspace disease, decreased size of liver metastases, near complete resolution of bowel obstruction, new diverting colostomy  Cycle 6 FOLFOX 07/17/2018  Cycle 7 FOLFOX 07/31/2018  Cycle 8 FOLFOX 08/14/2018  Cycle 9 FOLFOX 08/28/2018 (oxaliplatin held due to mild neutropenia)  Cycle 10 FOLFOX 09/11/2018  CT 09/22/2018-enlargement of lung nodules, stable indeterminate liver lesions, similar mass in the distal sigmoid colon, 17 mm polypoid lesion seen in the left bladder, persistent small bowel distention proximal to an area of small bowel involvement by direct tumor extension  Cycle 1 FOLFIRI 10/09/2018  Cycle 2 FOLFIRI 10/23/2018  Cycle 3 FOLFIRI 11/05/2018  Cycle 4 FOLFIRI 11/20/2018-Udenyca added  Cycle 5 FOLFIRI 12/04/2018  CTs 12/16/2018-mild interval improvement in bilateral pulmonary metastases.  Stable mild right paratracheal lymphadenopathy.  Mild decrease in size of dominant liver metastasis hepatic dome.  Stable subcentimeter lymph node small bowel mesentery and left periaortic region.  No significant change in bulky central pelvic soft tissue mass involving the sigmoid colon.  Stable mild diffuse bladder wall thickening.  Decreased size of intraluminal soft tissue density involving the left posterior bladder wall.  No new or progressive metastatic disease identified.  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 He will follow-up with Dr. Blackman. Status post right inguinal hernia repair with mesh 06/23/2018.  6.Anemia, multifactorial secondary to chronic disease and blood loss; transfused 2 units of blood 05/24/2018  7.Right lower extremity deep vein thrombosis  07/17/2018-started on   Xarelto  8.  Oxaliplatin neuropathy  Disposition: [name] appears stable.  He has completed 5 cycles of FOLFIRI.  Restaging CTs show some improvement.  Plan to proceed with cycle 6 FOLFIRI today as scheduled.  We reviewed the CBC from today.  Counts adequate to proceed with treatment.  He will continue to receive Udenyca on the day of pump discontinuation.  He is scheduled for ureter stent exchange on 01/01/2019.  As such, the next cycle of FOLFIRI will be given at a 3-week interval.  He will return for lab, follow-up, FOLFIRI on 01/08/2019.  He will contact the office in the interim with any problems.  Patient seen with Dr. Benay Spice.  CT images reviewed on the computer with [name].  25 minutes were spent face-to-face at today's visit with the majority of that time involved in counseling/coordination of care.    Ned Card ANP/GNP-BC   12/18/2018  9:40 AM  This was a shared visit with Ned Card.  [name] has completed 5 cycles of FOLFIRI.  The restaging CTs are consistent with a response to therapy.  The plan is to continue FOLFIRI.  We reviewed the CT images and treatment plan with [name]. Julieanne Manson, MD

## 2018-12-18 NOTE — Progress Notes (Signed)
Per Ned Card, pt is OK to treat with today's labs.

## 2018-12-18 NOTE — Patient Instructions (Signed)
[facility] Discharge Instructions for Patients Receiving Chemotherapy  Today you received the following chemotherapy agents:  Irinotecan, Leucovorin, and 5FU.   To help prevent nausea and vomiting after your treatment, we encourage you to take your nausea medication as directed.   If you develop nausea and vomiting that is not controlled by your nausea medication, call the clinic.   BELOW ARE SYMPTOMS THAT SHOULD BE REPORTED IMMEDIATELY:  *FEVER GREATER THAN 100.5 F  *CHILLS WITH OR WITHOUT FEVER  NAUSEA AND VOMITING THAT IS NOT CONTROLLED WITH YOUR NAUSEA MEDICATION  *UNUSUAL SHORTNESS OF BREATH  *UNUSUAL BRUISING OR BLEEDING  TENDERNESS IN MOUTH AND THROAT WITH OR WITHOUT PRESENCE OF ULCERS  *URINARY PROBLEMS  *BOWEL PROBLEMS  UNUSUAL RASH Items with * indicate a potential emergency and should be followed up as soon as possible.  Feel free to call the clinic should you have any questions or concerns. The clinic phone number is [phone].  Please show the CHEMO ALERT CARD at check-in to the Emergency Department and triage nurse.   

## 2018-12-20 ENCOUNTER — Other Ambulatory Visit: Payer: Self-pay

## 2018-12-20 ENCOUNTER — Inpatient Hospital Stay: Payer: PRIVATE HEALTH INSURANCE

## 2018-12-20 VITALS — BP 136/62 | HR 79 | Temp 98.0°F | Resp 18

## 2018-12-20 DIAGNOSIS — Z5111 Encounter for antineoplastic chemotherapy: Secondary | ICD-10-CM | POA: Diagnosis not present

## 2018-12-20 DIAGNOSIS — C187 Malignant neoplasm of sigmoid colon: Secondary | ICD-10-CM

## 2018-12-20 MED ORDER — PEGFILGRASTIM-CBQV 6 MG/0.6ML ~~LOC~~ SOSY
6.0000 mg | PREFILLED_SYRINGE | Freq: Once | SUBCUTANEOUS | Status: AC
  Administered 2018-12-20: 12:00:00 6 mg via SUBCUTANEOUS

## 2018-12-20 MED ORDER — SODIUM CHLORIDE 0.9% FLUSH
10.0000 mL | INTRAVENOUS | Status: DC | PRN
  Administered 2018-12-20: 12:00:00 10 mL via INTRAVENOUS
  Filled 2018-12-20: qty 10

## 2018-12-20 MED ORDER — HEPARIN SOD (PORK) LOCK FLUSH 100 UNIT/ML IV SOLN
500.0000 [IU] | Freq: Once | INTRAVENOUS | Status: AC
  Administered 2018-12-20: 12:00:00 500 [IU] via INTRAVENOUS
  Filled 2018-12-20: qty 5

## 2018-12-20 NOTE — Patient Instructions (Signed)

## 2018-12-24 NOTE — Patient Instructions (Addendum)
DUE TO COVID-19 ONLY ONE VISITOR IS ALLOWED TO COME WITH YOU AND STAY IN THE WAITING ROOM ONLY DURING PRE OP AND PROCEDURE DAY OF SURGERY. THE 1 VISITOR MAY VISIT WITH YOU AFTER SURGERY IN YOUR PRIVATE ROOM DURING VISITING HOURS ONLY!  YOU NEED TO HAVE A COVID 19 TEST ON 12-29-2018 AT 1000 am , THIS TEST MUST BE DONE BEFORE SURGERY, COME  [place], [place] , [id number].  ([place]) ONCE YOUR COVID TEST IS COMPLETED, PLEASE BEGIN THE QUARANTINE INSTRUCTIONS AS OUTLINED IN YOUR HANDOUT.                Miguel Gamble    Your procedure is scheduled on: 01-01-2019   Report to [facility] Main  Entrance   Report to admitting at 130 PM     Call this number if you have problems the morning of surgery [phone]    Remember: Do not eat food  :After Midnight. CLEAR LIQUIDS FROM MIDNIGHT UNTIL 930 AM. NOTHING BY MOUTH AFTER 930 AM DAY OF SURGERY.    CLEAR LIQUID DIET   Foods Allowed                                                                     Foods Excluded  Coffee and tea, regular and decaf                             liquids that you cannot  Plain Jell-O any favor except red or purple                                           see through such as: Fruit ices (not with fruit pulp)                                     milk, soups, orange juice  Iced Popsicles                                    All solid food Carbonated beverages, regular and diet                                    Cranberry, grape and apple juices Sports drinks like Gatorade Lightly seasoned clear broth or consume(fat free) Sugar, honey syrup  Sample Menu Breakfast                                Lunch                                     Supper Cranberry juice                    Beef broth  Chicken broth Jell-O                                     Grape juice                           Apple juice Coffee or tea                        Jell-O                                       Popsicle                                                Coffee or tea                        Coffee or tea  _____________________________________________________________________    BRUSH YOUR TEETH MORNING OF SURGERY AND RINSE YOUR MOUTH OUT, NO CHEWING GUM CANDY OR MINTS.     Take these medicines the morning of surgery with A SIP OF WATER: NONE                                 You may not have any metal on your body including hair pins and              piercings  Do not wear jewelry, make-up, lotions, powders or perfumes, deodorant             Do not wear nail polish.  Do not shave  48 hours prior to surgery.              Men may shave face and neck.   Do not bring valuables to the hospital. [place].  Contacts, dentures or bridgework may not be worn into surgery.  Leave suitcase in the car. After surgery it may be brought to your room.     Patients discharged the day of surgery will not be allowed to drive home. IF YOU ARE HAVING SURGERY AND GOING HOME THE SAME DAY, YOU MUST HAVE AN ADULT TO DRIVE YOU HOME AND BE WITH YOU FOR 24 HOURS. YOU MAY GO HOME BY TAXI OR UBER OR ORTHERWISE, BUT AN ADULT MUST ACCOMPANY YOU HOME AND STAY WITH YOU FOR 24 HOURS.  Name and phone number of your driver: nacy king wife cell [phone]  Special Instructions: N/A              Please read over the following fact sheets you were given: _____________________________________________________________________             [facility] - Preparing for Surgery Before surgery, you can play an important role.  Because skin is not sterile, your skin needs to be as free of germs as possible.  You can reduce the number of germs on your skin by washing with CHG (chlorahexidine gluconate) soap before surgery.  CHG is an antiseptic cleaner  which kills germs and bonds with the skin to continue killing germs even after washing. Please DO NOT use  if you have an allergy to CHG or antibacterial soaps.  If your skin becomes reddened/irritated stop using the CHG and inform your nurse when you arrive at Short Stay. Do not shave (including legs and underarms) for at least 48 hours prior to the first CHG shower.  You may shave your face/neck. Please follow these instructions carefully:  1.  Shower with CHG Soap the night before surgery and the  morning of Surgery.  2.  If you choose to wash your hair, wash your hair first as usual with your  normal  shampoo.  3.  After you shampoo, rinse your hair and body thoroughly to remove the  shampoo.                           4.  Use CHG as you would any other liquid soap.  You can apply chg directly  to the skin and wash                       Gently with a scrungie or clean washcloth.  5.  Apply the CHG Soap to your body ONLY FROM THE NECK DOWN.   Do not use on face/ open                           Wound or open sores. Avoid contact with eyes, ears mouth and genitals (private parts).                       Wash face,  Genitals (private parts) with your normal soap.             6.  Wash thoroughly, paying special attention to the area where your surgery  will be performed.  7.  Thoroughly rinse your body with warm water from the neck down.  8.  DO NOT shower/wash with your normal soap after using and rinsing off  the CHG Soap.                9.  Pat yourself dry with a clean towel.            10.  Wear clean pajamas.            11.  Place clean sheets on your bed the night of your first shower and do not  sleep with pets. Day of Surgery : Do not apply any lotions/deodorants the morning of surgery.  Please wear clean clothes to the hospital/surgery center.  FAILURE TO FOLLOW THESE INSTRUCTIONS MAY RESULT IN THE CANCELLATION OF YOUR SURGERY PATIENT SIGNATURE_________________________________  NURSE  SIGNATURE__________________________________  ________________________________________________________________________

## 2018-12-25 ENCOUNTER — Encounter (HOSPITAL_COMMUNITY): Payer: Self-pay

## 2018-12-25 ENCOUNTER — Encounter (HOSPITAL_COMMUNITY)
Admission: RE | Admit: 2018-12-25 | Discharge: 2018-12-25 | Disposition: A | Discharge status: Home / Self Care | Payer: PRIVATE HEALTH INSURANCE | Source: Ambulatory Visit | Attending: Urology | Admitting: Urology

## 2018-12-25 ENCOUNTER — Other Ambulatory Visit: Payer: Self-pay

## 2018-12-25 ENCOUNTER — Other Ambulatory Visit: Payer: Self-pay | Admitting: Urology

## 2018-12-25 DIAGNOSIS — Z01812 Encounter for preprocedural laboratory examination: Secondary | ICD-10-CM | POA: Insufficient documentation

## 2018-12-25 LAB — BASIC METABOLIC PANEL
Anion gap: 7 (ref 5–15)
BUN: 32 mg/dL — ABNORMAL HIGH (ref 8–23)
CO2: 20 mmol/L — ABNORMAL LOW (ref 22–32)
Calcium: 8.4 mg/dL — ABNORMAL LOW (ref 8.9–10.3)
Chloride: 114 mmol/L — ABNORMAL HIGH (ref 98–111)
Creatinine, Ser: 1.88 mg/dL — ABNORMAL HIGH (ref 0.61–1.24)
GFR calc Af Amer: 41 mL/min — ABNORMAL LOW (ref >60)
GFR calc non Af Amer: 36 mL/min — ABNORMAL LOW (ref >60)
Glucose, Bld: 103 mg/dL — ABNORMAL HIGH (ref 70–99)
Potassium: 4.2 mmol/L (ref 3.5–5.1)
Sodium: 141 mmol/L (ref 135–145)

## 2018-12-25 LAB — CBC
HCT: 25.7 % — ABNORMAL LOW (ref 39.0–52.0)
Hemoglobin: 7.4 g/dL — ABNORMAL LOW (ref 13.0–17.0)
MCH: 25.3 pg — ABNORMAL LOW (ref 26.0–34.0)
MCHC: 28.8 g/dL — ABNORMAL LOW (ref 30.0–36.0)
MCV: 87.7 fL (ref 80.0–100.0)
Platelets: 179 10*3/uL (ref 150–400)
RBC: 2.93 MIL/uL — ABNORMAL LOW (ref 4.22–5.81)
RDW: 21.5 % — ABNORMAL HIGH (ref 11.5–15.5)
WBC: 8.1 10*3/uL (ref 4.0–10.5)
nRBC: 0 % (ref 0.0–0.2)

## 2018-12-25 NOTE — Progress Notes (Addendum)
PCP - dr Virgina Jock Cardiologist - none  Chest x-ray - none EKG - none Stress Test - none ECHO - none Cardiac Cath - none  Sleep Study - none CPAP -   Fasting Blood Sugar - n/a Checks Blood Sugar _____ times a day  Blood Thinner Instructions:xarelto continue per dr Alinda Money Aspirin Instructions:none Last Dose:  Anesthesia review: CBC AND BMET RESULTS 12-25-18 TO JESSICA ZANETTO PA FOR REVIEW  Patient denies shortness of breath, fever, cough and chest pain at PAT appointment   Patient verbalized understanding of instructions that were given to them at the PAT appointment. Patient was also instructed that they will need to review over the PAT instructions again at home before surgery.

## 2018-12-26 ENCOUNTER — Telehealth: Payer: Self-pay | Admitting: Oncology

## 2018-12-26 NOTE — Telephone Encounter (Signed)
Called and spoke with patient. Confirmed appt  °

## 2018-12-29 ENCOUNTER — Other Ambulatory Visit (HOSPITAL_COMMUNITY)
Admission: RE | Admit: 2018-12-29 | Discharge: 2018-12-29 | Disposition: A | Discharge status: Home / Self Care | Payer: PRIVATE HEALTH INSURANCE | Source: Ambulatory Visit | Attending: Urology | Admitting: Urology

## 2018-12-29 ENCOUNTER — Other Ambulatory Visit (HOSPITAL_COMMUNITY): Payer: PRIVATE HEALTH INSURANCE

## 2018-12-29 DIAGNOSIS — Z01812 Encounter for preprocedural laboratory examination: Secondary | ICD-10-CM | POA: Insufficient documentation

## 2018-12-29 DIAGNOSIS — Z20828 Contact with and (suspected) exposure to other viral communicable diseases: Secondary | ICD-10-CM | POA: Diagnosis not present

## 2018-12-30 LAB — NOVEL CORONAVIRUS, NAA (HOSP ORDER, SEND-OUT TO REF LAB; TAT 18-24 HRS): SARS-CoV-2, NAA: NOT DETECTED

## 2018-12-30 NOTE — Progress Notes (Signed)
Anesthesia Chart Review   Case: [id number] Date/Time: 01/01/19 1515   Procedure: CYSTOSCOPY WITH STENT CHANGE VERSUS REMOVAL/ RETROGRADE PYELOGRAM (Bilateral )   Anesthesia type: General   Pre-op diagnosis: BILATERAL URETERAL OBSTRUCTION   Location: [facility] ROOM 03 / [facility]   Surgeon: Raynelle Bring, MD      DISCUSSION:68 y.o. former smoker (quit 04/16/85) with h/o metastatic colon cancer (completed 6 cycles of FOLFIRI), right leg DVT 07/17/2018 on Eliquis (advised not to hold for procedure), anemia, bilateral   S/p cystoscopy with bilateral stent exchange 08/04/2018 with no anesthesia complications noted.   Last seen by oncology 12/18/2018.  Per OV note pt stable; anemia, multifactorial secondary to chronic disease and blood loss; transfused 2 units of blood 05/24/2018.  Hemoglobin stable.  VS: BP 125/65   Pulse 71   Temp 37.2 C (Oral)   Resp 16   Ht 5\' 8"  (1.727 m)   Wt 66.2 kg   SpO2 99%   BMI 22.20 kg/m   PROVIDERS: Wenda Low, MD is PCP   Betsy Coder, MD is Oncologist  LABS: Labs reviewed: Acceptable for surgery. (all labs ordered are listed, but only abnormal results are displayed)  Labs Reviewed  BASIC METABOLIC PANEL - Abnormal; Notable for the following components:      Result Value   Chloride 114 (*)    CO2 20 (*)    Glucose, Bld 103 (*)    BUN 32 (*)    Creatinine, Ser 1.88 (*)    Calcium 8.4 (*)    GFR calc non Af Amer 36 (*)    GFR calc Af Amer 41 (*)    All other components within normal limits  CBC - Abnormal; Notable for the following components:   RBC 2.93 (*)    Hemoglobin 7.4 (*)    HCT 25.7 (*)    MCH 25.3 (*)    MCHC 28.8 (*)    RDW 21.5 (*)    All other components within normal limits     IMAGES: CT Abdomen Pelvis 12/16/2018 IMPRESSION: 1. Mild interval improvement in bilateral pulmonary metastases. 2. Stable mild right paratracheal lymphadenopathy. 3. Mild decrease in size of dominant liver metastasis in the hepatic dome. 4. Stable sub-cm  lymph nodes in small bowel mesentery and left paraaortic region. 5. No significant change in bulky central pelvic soft tissue mass involving the sigmoid colon. 6. Stable mild diffuse bladder wall thickening. Decreased size of intraluminal soft tissue density involving the left posterior bladder wall. 7. No new or progressive metastatic disease identified.  EKG:   CV:  Past Medical History:  Diagnosis Date  . Anemia   . Cancer [facility]) dx 15 Mar 2018   colon with kidney mets  . Right leg DVT ([place]) 2020    Past Surgical History:  Procedure Laterality Date  . COLOSTOMY N/A 04/17/2018   Procedure: COLOSTOMY;  Surgeon: [name], MD;  Location: [facility];  Service: General;  Laterality: N/A;  . CYSTOSCOPY W/ URETERAL STENT PLACEMENT Bilateral 04/15/2018   Procedure: CYSTOSCOPY BILATERAL WITH RETROGRADE PYELOGRAM/URETERAL BILATERAL STENT PLACEMENT WITH TRANSURETHRAL RESECTION OF BLADDER TUMOR;  Surgeon: Raynelle Bring, MD;  Location: [facility];  Service: Urology;  Laterality: Bilateral;  . CYSTOSCOPY W/ URETERAL STENT PLACEMENT Bilateral 08/04/2018   Procedure: CYSTOSCOPY WITH BILATERAL STENT EXCHANGE;  Surgeon: Raynelle Bring, MD;  Location: [facility];  Service: Urology;  Laterality: Bilateral;  . DENTAL SURGERY  2010   permanent full mounth dental implants  . INGUINAL HERNIA REPAIR Right 06/23/2018  Procedure: RIGHT HERNIA REPAIR INGUINAL WITH MESH;  Surgeon: [name], MD;  Location: [place];  Service: General;  Laterality: Right;  . IR IMAGING GUIDED PORT INSERTION  04/21/2018  . LAPAROTOMY N/A 04/17/2018   Procedure: EXPLORATORY LAPAROTOMY;  Surgeon: [name], MD;  Location: [facility];  Service: General;  Laterality: N/A;  . PORTA CATH INSERTION  04/2018    MEDICATIONS: . pegfilgrastim-cbqv (UDENYCA) 6 MG/0.6ML injection  . acetaminophen (TYLENOL) 500 MG tablet  . lidocaine-prilocaine (EMLA) cream  . loperamide (IMODIUM) 2 MG capsule  . phenazopyridine (PYRIDIUM) 100 MG  tablet  . prochlorperazine (COMPAZINE) 10 MG tablet  . rivaroxaban (XARELTO) 20 MG TABS tablet  . traMADol (ULTRAM) 50 MG tablet   No current facility-administered medications for this encounter.     Maia Plan [facility] Pre-Surgical Testing [phone] 12/30/18  11:45 AM

## 2018-12-31 NOTE — H&P (Signed)
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 He has had a mixed response to chemotherapy with some decrease in size of lesions and pulmonary lesions that have increased in size to a degree. He also has developed some neuro toxicity related to platinum-based therapy and is currently scheduled to change therapy in the near future. He has been tolerating therapy quite well and has been able to actually gain weight. He denies any significant problems related to his stents including no significant lower urinary tract symptoms, hematuria, etc. His last stents were exchanged on 08/04/2018 with minimal incrustation. He had 6 x 24 double-J ureteral stents placed at that time.     ALLERGIES: None   MEDICATIONS: Compazine 1 PO PRN  Emla  Pyridium 1 PRN  Tylenol 1 PO PRN  Ultram 1 PO PRN  Xarelto     GU PSH: Cystoscopy Insert Stent, Bilateral - 08/04/2018, Bilateral - 04/15/2018 Cystoscopy TURBT 2-5 cm - 04/15/2018    NON-GU PSH: Colostomy Dental Surgery Procedure    GU PMH: Ureteral obstruction - 06/20/2018      PMH Notes:   1) Bilateral ureteral obstruction: He presented in December 2019 with renal failure and bilateral hydronephrosis and a bladder mass. TURBT revealed adenocarcinoma from the colon. Bilateral ureteral stents were placed and his Cr stabilized at 2.0.   Dec 2019: Bilateral ureteral stent placement  Apr 2020: Bilateral ureteral stent change (6 x24)   NON-GU PMH: Colon Cancer, History    FAMILY HISTORY: Congestive Heart Failure - Father Lung Cancer - Mother   SOCIAL HISTORY: Marital Status: Married Preferred Language: English; Ethnicity: Not Hispanic Or Latino; Race: Black or African American Current Smoking Status: Patient does not smoke anymore. Has not smoked since 06/15/1986.   Tobacco Use Assessment Completed: Used Tobacco in last 30 days? Does  not use smokeless tobacco. Does drink.  Does not use drugs. Drinks 1 caffeinated drink per day.    REVIEW OF SYSTEMS:    GU Review Male:   Patient denies frequent urination, hard to postpone urination, burning/ pain with urination, get up at night to urinate, leakage of urine, stream starts and stops, trouble starting your streams, and have to strain to urinate .  Gastrointestinal (Lower):   Patient denies diarrhea and constipation.  Gastrointestinal (Upper):   Patient denies nausea and vomiting.  Constitutional:   Patient denies fever, night sweats, weight loss, and fatigue.  Skin:   Patient denies skin rash/ lesion and itching.  Eyes:   Patient denies blurred vision and double vision.  Ears/ Nose/ Throat:   Patient denies sore throat and sinus problems.  Hematologic/Lymphatic:   Patient denies swollen glands and easy bruising.  Cardiovascular:   Patient denies leg swelling and chest pains.  Respiratory:   Patient denies cough and shortness of breath.  Endocrine:   Patient denies excessive thirst.  Musculoskeletal:   Patient denies back pain and joint pain.  Neurological:   Patient denies headaches and dizziness.  Psychologic:   Patient denies depression and anxiety.   VITAL SIGNS:     Weight 148 lb / 67.13 kg  Height 68 in / 172.72 cm  BMI 22.5 kg/m   MULTI-SYSTEM PHYSICAL EXAMINATION:    Constitutional: Well-nourished. No physical deformities. Normally developed. Good grooming.  Respiratory: No labored breathing, no use of accessory muscles.   Cardiovascular: Normal temperature, normal extremity pulses, no swelling, no varicosities.  ASSESSMENT:      ICD-10 Details  1 GU:   Ureteral obstruction - N13.1    PLAN:        1. Bilateral ureteral obstruction: He is tolerating his current stents quite well. Considering the minimal encrustation when his stents were changed 4 months later at his last stent change, we will try to increase the interval between stents. Our current  plan is to consider his next stent change in September in between treatment.

## 2019-01-01 ENCOUNTER — Ambulatory Visit (HOSPITAL_COMMUNITY): Payer: PRIVATE HEALTH INSURANCE | Admitting: Certified Registered Nurse Anesthetist

## 2019-01-01 ENCOUNTER — Ambulatory Visit (HOSPITAL_COMMUNITY)
Admission: RE | Admit: 2019-01-01 | Discharge: 2019-01-01 | Disposition: A | Discharge status: Home / Self Care | Payer: PRIVATE HEALTH INSURANCE | Attending: Urology | Admitting: Urology

## 2019-01-01 ENCOUNTER — Encounter (HOSPITAL_COMMUNITY)
Admission: RE | Disposition: A | Discharge status: Home / Self Care | Payer: Self-pay | Source: Home / Self Care | Attending: Urology

## 2019-01-01 ENCOUNTER — Encounter (HOSPITAL_COMMUNITY): Payer: Self-pay | Admitting: Emergency Medicine

## 2019-01-01 ENCOUNTER — Ambulatory Visit (HOSPITAL_COMMUNITY): Payer: PRIVATE HEALTH INSURANCE

## 2019-01-01 ENCOUNTER — Ambulatory Visit (HOSPITAL_COMMUNITY): Payer: PRIVATE HEALTH INSURANCE | Admitting: Physician Assistant

## 2019-01-01 ENCOUNTER — Other Ambulatory Visit: Payer: Self-pay

## 2019-01-01 DIAGNOSIS — Z86718 Personal history of other venous thrombosis and embolism: Secondary | ICD-10-CM | POA: Diagnosis not present

## 2019-01-01 DIAGNOSIS — Z7901 Long term (current) use of anticoagulants: Secondary | ICD-10-CM | POA: Insufficient documentation

## 2019-01-01 DIAGNOSIS — Z87891 Personal history of nicotine dependence: Secondary | ICD-10-CM | POA: Insufficient documentation

## 2019-01-01 DIAGNOSIS — C189 Malignant neoplasm of colon, unspecified: Secondary | ICD-10-CM | POA: Diagnosis not present

## 2019-01-01 DIAGNOSIS — N135 Crossing vessel and stricture of ureter without hydronephrosis: Secondary | ICD-10-CM | POA: Diagnosis present

## 2019-01-01 DIAGNOSIS — Z933 Colostomy status: Secondary | ICD-10-CM | POA: Insufficient documentation

## 2019-01-01 HISTORY — PX: CYSTOSCOPY WITH STENT PLACEMENT: SHX5790

## 2019-01-01 SURGERY — CYSTOSCOPY, WITH STENT INSERTION
Anesthesia: General | Laterality: Bilateral

## 2019-01-01 MED ORDER — DEXAMETHASONE SODIUM PHOSPHATE 10 MG/ML IJ SOLN
INTRAMUSCULAR | Status: AC
  Filled 2019-01-01: qty 1

## 2019-01-01 MED ORDER — SODIUM CHLORIDE 0.9 % IV SOLN
INTRAVENOUS | Status: DC
  Administered 2019-01-01: 14:00:00 via INTRAVENOUS

## 2019-01-01 MED ORDER — LIDOCAINE 2% (20 MG/ML) 5 ML SYRINGE
INTRAMUSCULAR | Status: DC | PRN
  Administered 2019-01-01: 60 mg via INTRAVENOUS

## 2019-01-01 MED ORDER — STERILE WATER FOR IRRIGATION IR SOLN
Status: DC | PRN
  Administered 2019-01-01: 3000 mL

## 2019-01-01 MED ORDER — FENTANYL CITRATE (PF) 100 MCG/2ML IJ SOLN: 25.0000 ug | INTRAMUSCULAR | Status: DC | PRN

## 2019-01-01 MED ORDER — PROPOFOL 10 MG/ML IV BOLUS
INTRAVENOUS | Status: AC
  Filled 2019-01-01: qty 20

## 2019-01-01 MED ORDER — MIDAZOLAM HCL 5 MG/5ML IJ SOLN
INTRAMUSCULAR | Status: DC | PRN
  Administered 2019-01-01: 2 mg via INTRAVENOUS

## 2019-01-01 MED ORDER — ONDANSETRON HCL 4 MG/2ML IJ SOLN
INTRAMUSCULAR | Status: AC
  Filled 2019-01-01: qty 2

## 2019-01-01 MED ORDER — SODIUM CHLORIDE 0.9 % IV SOLN
2.0000 g | INTRAVENOUS | Status: AC
  Administered 2019-01-01: 2 g via INTRAVENOUS
  Filled 2019-01-01: qty 20

## 2019-01-01 MED ORDER — DEXAMETHASONE SODIUM PHOSPHATE 10 MG/ML IJ SOLN
INTRAMUSCULAR | Status: DC | PRN
  Administered 2019-01-01: 10 mg via INTRAVENOUS

## 2019-01-01 MED ORDER — PROMETHAZINE HCL 25 MG/ML IJ SOLN: 6.2500 mg | INTRAMUSCULAR | Status: DC | PRN

## 2019-01-01 MED ORDER — FENTANYL CITRATE (PF) 100 MCG/2ML IJ SOLN
INTRAMUSCULAR | Status: AC
  Filled 2019-01-01: qty 2

## 2019-01-01 MED ORDER — MIDAZOLAM HCL 2 MG/2ML IJ SOLN
INTRAMUSCULAR | Status: AC
  Filled 2019-01-01: qty 2

## 2019-01-01 MED ORDER — FENTANYL CITRATE (PF) 100 MCG/2ML IJ SOLN
INTRAMUSCULAR | Status: DC | PRN
  Administered 2019-01-01: 25 ug via INTRAVENOUS

## 2019-01-01 MED ORDER — IOHEXOL 300 MG/ML  SOLN
INTRAMUSCULAR | Status: DC | PRN
  Administered 2019-01-01: 16 mL

## 2019-01-01 MED ORDER — ONDANSETRON HCL 4 MG/2ML IJ SOLN
INTRAMUSCULAR | Status: DC | PRN
  Administered 2019-01-01: 4 mg via INTRAVENOUS

## 2019-01-01 MED ORDER — EPHEDRINE SULFATE-NACL 50-0.9 MG/10ML-% IV SOSY
PREFILLED_SYRINGE | INTRAVENOUS | Status: DC | PRN
  Administered 2019-01-01 (×2): 10 mg via INTRAVENOUS

## 2019-01-01 MED ORDER — PROPOFOL 10 MG/ML IV BOLUS
INTRAVENOUS | Status: DC | PRN
  Administered 2019-01-01: 50 mg via INTRAVENOUS
  Administered 2019-01-01: 150 mg via INTRAVENOUS

## 2019-01-01 MED ORDER — 0.9 % SODIUM CHLORIDE (POUR BTL) OPTIME
TOPICAL | Status: DC | PRN
  Administered 2019-01-01: 1000 mL

## 2019-01-01 MED ORDER — PHENYLEPHRINE 40 MCG/ML (10ML) SYRINGE FOR IV PUSH (FOR BLOOD PRESSURE SUPPORT)
PREFILLED_SYRINGE | INTRAVENOUS | Status: DC | PRN
  Administered 2019-01-01 (×2): 120 ug via INTRAVENOUS
  Administered 2019-01-01 (×2): 80 ug via INTRAVENOUS

## 2019-01-01 SURGICAL SUPPLY — 13 items
BAG URO CATCHER STRL LF (MISCELLANEOUS) ×3 IMPLANT
CATH INTERMIT  6FR 70CM (CATHETERS) ×3 IMPLANT
CLOTH BEACON ORANGE TIMEOUT ST (SAFETY) ×3 IMPLANT
COVER WAND RF STERILE (DRAPES) IMPLANT
GLOVE BIOGEL M STRL SZ7.5 (GLOVE) ×3 IMPLANT
GOWN STRL REUS W/TWL LRG LVL3 (GOWN DISPOSABLE) ×6 IMPLANT
GUIDEWIRE STR DUAL SENSOR (WIRE) ×3 IMPLANT
KIT TURNOVER KIT A (KITS) IMPLANT
MANIFOLD NEPTUNE II (INSTRUMENTS) ×3 IMPLANT
PACK CYSTO (CUSTOM PROCEDURE TRAY) ×3 IMPLANT
TUBING CONNECTING 10 (TUBING) ×2 IMPLANT
TUBING CONNECTING 10' (TUBING) ×1
TUBING UROLOGY SET (TUBING) IMPLANT

## 2019-01-01 NOTE — Transfer of Care (Signed)
Immediate Anesthesia Transfer of Care Note  Patient: Miguel Gamble  Procedure(s) Performed: CYSTOSCOPY WITH STENT, RETROGRADE PYELOGRAM (Bilateral )  Patient Location: PACU  Anesthesia Type:General  Level of Consciousness: awake, alert , oriented and patient cooperative  Airway & Oxygen Therapy: Patient Spontanous Breathing and Patient connected to face mask oxygen  Post-op Assessment: Report given to RN and Post -op Vital signs reviewed and stable  Post vital signs: Reviewed and stable  Last Vitals:  Vitals Value Taken Time  BP 141/72 01/01/19 1530  Temp    Pulse 79 01/01/19 1531  Resp 7 01/01/19 1531  SpO2 100 % 01/01/19 1531  Vitals shown include unvalidated device data.  Last Pain:  Vitals:   01/01/19 1349  TempSrc:   PainSc: 0-No pain      Patients Stated Pain Goal: 4 ([id number])  Complications: No apparent anesthesia complications

## 2019-01-01 NOTE — Anesthesia Postprocedure Evaluation (Signed)
Anesthesia Post Note  Patient: Miguel Gamble  Procedure(s) Performed: CYSTOSCOPY WITH STENT, RETROGRADE PYELOGRAM (Bilateral )     Patient location during evaluation: PACU Anesthesia Type: General Level of consciousness: awake and alert Pain management: pain level controlled Vital Signs Assessment: post-procedure vital signs reviewed and stable Respiratory status: spontaneous breathing, nonlabored ventilation, respiratory function stable and patient connected to nasal cannula oxygen Cardiovascular status: blood pressure returned to baseline and stable Postop Assessment: no apparent nausea or vomiting Anesthetic complications: no    Last Vitals:  Vitals:   01/01/19 1530 01/01/19 1600  BP: (!) 141/72 137/70  Pulse: 81 76  Resp: 18 10  Temp:  36.9 C  SpO2: 100% 99%    Last Pain:  Vitals:   01/01/19 1600  TempSrc:   PainSc: 0-No pain                 Tiajuana Amass

## 2019-01-01 NOTE — Op Note (Signed)
Preoperative diagnosis:  1. Bilateral ureteral obstruction   Postoperative diagnosis:  1. Bilateral ureteral obstruction   Procedure:  1. Cystoscopy 2. Bilateral ureteral stent placement (6 x 24 - no string)  Surgeon: Roxy Horseman, Brooke Bonito. M.D.  Anesthesia: General  Complications: None  Intraoperative findings: Bilateral retrograde pyelography was performed with omnipaque contrast and 6 Fr ureteral catheters.  This demonstrated significant beading and obstruction of the the right ureter throughout the mid/distal ureter consistent with extrinsic obstruction.  The left ureter was mildly dilated and no hydronephrosis was noted on this side.  Contrast did drain from the left kidney.  EBL: Minimal  Specimens: None  Indication: Miguel Gamble is a 68 y.o. patient with bilateral ureteral obstruction due to colon cancer. After reviewing the management options for treatment, he elected to proceed with the above surgical procedure(s). We have discussed the potential benefits and risks of the procedure, side effects of the proposed treatment, the likelihood of the patient achieving the goals of the procedure, and any potential problems that might occur during the procedure or recuperation. Informed consent has been obtained.  Description of procedure:  The patient was taken to the operating room and general anesthesia was induced.  The patient was placed in the dorsal lithotomy position, prepped and draped in the usual sterile fashion, and preoperative antibiotics were administered. A preoperative time-out was performed.   Cystourethroscopy was performed.  The patient's urethra was examined and was unremarkable. The bladder was then systematically examined in its entirety. There was no evidence for any bladder tumors, stones, or other mucosal pathology.    Attention then turned to the ureteral orifice and the patient's indwelling ureteral stent was identified and brought out to the urethral  meatus with the flexible graspers.  Retrograde pyelography on this side did indicate persistent obstruction.  It was felt that a ureteral stent was necessary and needed replacement.  A 0.38 sensor guidewire was then advanced up the right ureter into the renal pelvis under fluoroscopic guidance.  The wire was then backloaded through the cystoscope and a ureteral stent was advance over the wire using Seldinger technique.  The stent was positioned appropriately under fluoroscopic and cystoscopic guidance.  The wire was then removed with an adequate stent curl noted in the renal pelvis as well as in the bladder.  Attention then turned to the left ureteral orifice and the patient's indwelling ureteral stent was identified and brought out to the urethral meatus with the flexible graspers.  Retrograde pyelography was then performed and there did not appear to be significant obstruction on this side with contrast seen to be draining from the left ureter well.  However, it was felt that replacement of the stent on this side was indicated since he would need a right ureteral stent regardless.  A 0.38 sensor guidewire was then advanced up the left ureter into the renal pelvis under fluoroscopic guidance.  The wire was then backloaded through the cystoscope and a ureteral stent was advance over the wire using Seldinger technique.  The stent was positioned appropriately under fluoroscopic and cystoscopic guidance.  The wire was then removed with an adequate stent curl noted in the renal pelvis as well as in the bladder.  The bladder was then emptied and the procedure ended.  The patient appeared to tolerate the procedure well and without complications.  The patient was able to be awakened and transferred to the recovery unit in satisfactory condition.    Pryor Curia MD

## 2019-01-01 NOTE — Discharge Instructions (Signed)

## 2019-01-01 NOTE — Anesthesia Preprocedure Evaluation (Addendum)
Anesthesia Evaluation  Patient identified by MRN, date of birth, ID band Patient awake    Reviewed: Allergy & Precautions, NPO status , Patient's Chart, lab work & pertinent test results  Airway Mallampati: II  TM Distance: >3 FB Neck ROM: Full    Dental no notable dental hx. (+) Teeth Intact, Dental Advisory Given   Pulmonary former smoker,    Pulmonary exam normal breath sounds clear to auscultation       Cardiovascular + DVT  Normal cardiovascular exam Rhythm:Regular Rate:Normal     Neuro/Psych negative neurological ROS  negative psych ROS   GI/Hepatic negative GI ROS, Neg liver ROS, Colon CA with kidney mets    Endo/Other  negative endocrine ROS  Renal/GU CRFRenal disease     Musculoskeletal negative musculoskeletal ROS (+)   Abdominal   Peds  Hematology  (+) anemia ,   Anesthesia Other Findings Pt on Xarelto  Reproductive/Obstetrics                            Anesthesia Physical  Anesthesia Plan  ASA: III  Anesthesia Plan: General   Post-op Pain Management:    Induction: Intravenous  PONV Risk Score and Plan: 3 and Treatment may vary due to age or medical condition, Ondansetron and Dexamethasone  Airway Management Planned: LMA  Additional Equipment:   Intra-op Plan:   Post-operative Plan:   Informed Consent: I have reviewed the patients History and Physical, chart, labs and discussed the procedure including the risks, benefits and alternatives for the proposed anesthesia with the patient or authorized representative who has indicated his/her understanding and acceptance.     Dental advisory given  Plan Discussed with: CRNA  Anesthesia Plan Comments: (GA)        Anesthesia Quick Evaluation

## 2019-01-01 NOTE — Anesthesia Procedure Notes (Signed)
Procedure Name: LMA Insertion Performed by: West Pugh, CRNA Pre-anesthesia Checklist: Patient identified, Emergency Drugs available, Suction available, Patient being monitored and Timeout performed Patient Re-evaluated:Patient Re-evaluated prior to induction Oxygen Delivery Method: Circle system utilized Preoxygenation: Pre-oxygenation with 100% oxygen Induction Type: IV induction LMA: LMA with gastric port inserted LMA Size: 4.0 Number of attempts: 2 (large air leak noted, reinserted with no airleak.) Placement Confirmation: positive ETCO2 and breath sounds checked- equal and bilateral Tube secured with: Tape Dental Injury: Teeth and Oropharynx as per pre-operative assessment

## 2019-01-03 ENCOUNTER — Encounter (HOSPITAL_COMMUNITY): Payer: Self-pay | Admitting: Urology

## 2019-01-08 ENCOUNTER — Inpatient Hospital Stay: Payer: PRIVATE HEALTH INSURANCE | Admitting: Oncology

## 2019-01-08 ENCOUNTER — Inpatient Hospital Stay: Payer: PRIVATE HEALTH INSURANCE

## 2019-01-08 ENCOUNTER — Other Ambulatory Visit: Payer: Self-pay

## 2019-01-08 VITALS — BP 148/75 | HR 73 | Temp 97.8°F | Resp 17 | Ht 68.0 in | Wt 151.5 lb

## 2019-01-08 DIAGNOSIS — Z5111 Encounter for antineoplastic chemotherapy: Secondary | ICD-10-CM | POA: Diagnosis not present

## 2019-01-08 DIAGNOSIS — C187 Malignant neoplasm of sigmoid colon: Secondary | ICD-10-CM

## 2019-01-08 DIAGNOSIS — Z95828 Presence of other vascular implants and grafts: Secondary | ICD-10-CM

## 2019-01-08 DIAGNOSIS — Z23 Encounter for immunization: Secondary | ICD-10-CM

## 2019-01-08 DIAGNOSIS — C772 Secondary and unspecified malignant neoplasm of intra-abdominal lymph nodes: Secondary | ICD-10-CM | POA: Diagnosis not present

## 2019-01-08 LAB — CMP (CANCER CENTER ONLY)
ALT: 8 U/L (ref 0–44)
AST: 15 U/L (ref 15–41)
Albumin: 3.2 g/dL — ABNORMAL LOW (ref 3.5–5.0)
Alkaline Phosphatase: 104 U/L (ref 38–126)
Anion gap: 6 (ref 5–15)
BUN: 19 mg/dL (ref 8–23)
CO2: 23 mmol/L (ref 22–32)
Calcium: 8.5 mg/dL — ABNORMAL LOW (ref 8.9–10.3)
Chloride: 114 mmol/L — ABNORMAL HIGH (ref 98–111)
Creatinine: 1.73 mg/dL — ABNORMAL HIGH (ref 0.61–1.24)
GFR, Est AFR Am: 46 mL/min — ABNORMAL LOW (ref >60)
GFR, Estimated: 39 mL/min — ABNORMAL LOW (ref >60)
Glucose, Bld: 109 mg/dL — ABNORMAL HIGH (ref 70–99)
Potassium: 3.9 mmol/L (ref 3.5–5.1)
Sodium: 143 mmol/L (ref 135–145)
Total Bilirubin: 0.3 mg/dL (ref 0.3–1.2)
Total Protein: 6.2 g/dL — ABNORMAL LOW (ref 6.5–8.1)

## 2019-01-08 LAB — CBC WITH DIFFERENTIAL (CANCER CENTER ONLY)
Abs Immature Granulocytes: 0.07 10*3/uL (ref 0.00–0.07)
Basophils Absolute: 0.1 10*3/uL (ref 0.0–0.1)
Basophils Relative: 1 %
Eosinophils Absolute: 0.5 10*3/uL (ref 0.0–0.5)
Eosinophils Relative: 6 %
HCT: 25.7 % — ABNORMAL LOW (ref 39.0–52.0)
Hemoglobin: 7.8 g/dL — ABNORMAL LOW (ref 13.0–17.0)
Immature Granulocytes: 1 %
Lymphocytes Relative: 16 %
Lymphs Abs: 1.2 10*3/uL (ref 0.7–4.0)
MCH: 25.7 pg — ABNORMAL LOW (ref 26.0–34.0)
MCHC: 30.4 g/dL (ref 30.0–36.0)
MCV: 84.8 fL (ref 80.0–100.0)
Monocytes Absolute: 1 10*3/uL (ref 0.1–1.0)
Monocytes Relative: 13 %
Neutro Abs: 4.9 10*3/uL (ref 1.7–7.7)
Neutrophils Relative %: 63 %
Platelet Count: 323 10*3/uL (ref 150–400)
RBC: 3.03 MIL/uL — ABNORMAL LOW (ref 4.22–5.81)
RDW: 22.2 % — ABNORMAL HIGH (ref 11.5–15.5)
WBC Count: 7.7 10*3/uL (ref 4.0–10.5)
nRBC: 0 % (ref 0.0–0.2)

## 2019-01-08 LAB — CEA (IN HOUSE-CHCC): CEA (CHCC-In House): 29.35 ng/mL — ABNORMAL HIGH (ref 0.00–5.00)

## 2019-01-08 MED ORDER — SODIUM CHLORIDE 0.9 % IV SOLN
Freq: Once | INTRAVENOUS | Status: AC
  Administered 2019-01-08: 13:00:00 via INTRAVENOUS
  Filled 2019-01-08: qty 250

## 2019-01-08 MED ORDER — DEXAMETHASONE SODIUM PHOSPHATE 10 MG/ML IJ SOLN
10.0000 mg | Freq: Once | INTRAMUSCULAR | Status: AC
  Administered 2019-01-08: 10 mg via INTRAVENOUS

## 2019-01-08 MED ORDER — IRINOTECAN HCL CHEMO INJECTION 100 MG/5ML
180.0000 mg/m2 | Freq: Once | INTRAVENOUS | Status: AC
  Administered 2019-01-08: 320 mg via INTRAVENOUS
  Filled 2019-01-08: qty 15

## 2019-01-08 MED ORDER — ATROPINE SULFATE 1 MG/ML IJ SOLN
0.5000 mg | Freq: Once | INTRAMUSCULAR | Status: AC | PRN
  Administered 2019-01-08: 0.5 mg via INTRAVENOUS

## 2019-01-08 MED ORDER — INFLUENZA VAC A&B SA ADJ QUAD 0.5 ML IM PRSY
PREFILLED_SYRINGE | INTRAMUSCULAR | Status: AC
  Filled 2019-01-08: qty 0.5

## 2019-01-08 MED ORDER — DEXAMETHASONE SODIUM PHOSPHATE 10 MG/ML IJ SOLN
INTRAMUSCULAR | Status: AC
  Filled 2019-01-08: qty 1

## 2019-01-08 MED ORDER — ATROPINE SULFATE 0.4 MG/ML IJ SOLN
INTRAMUSCULAR | Status: AC
  Filled 2019-01-08: qty 1

## 2019-01-08 MED ORDER — SODIUM CHLORIDE 0.9 % IV SOLN
2400.0000 mg/m2 | INTRAVENOUS | Status: DC
  Administered 2019-01-08: 4300 mg via INTRAVENOUS
  Filled 2019-01-08: qty 86

## 2019-01-08 MED ORDER — SODIUM CHLORIDE 0.9% FLUSH
10.0000 mL | INTRAVENOUS | Status: DC | PRN
  Filled 2019-01-08: qty 10

## 2019-01-08 MED ORDER — PALONOSETRON HCL INJECTION 0.25 MG/5ML
0.2500 mg | Freq: Once | INTRAVENOUS | Status: AC
  Administered 2019-01-08: 0.25 mg via INTRAVENOUS

## 2019-01-08 MED ORDER — PALONOSETRON HCL INJECTION 0.25 MG/5ML
INTRAVENOUS | Status: AC
  Filled 2019-01-08: qty 5

## 2019-01-08 MED ORDER — ATROPINE SULFATE 1 MG/ML IJ SOLN
INTRAMUSCULAR | Status: AC
  Filled 2019-01-08: qty 1

## 2019-01-08 MED ORDER — LEUCOVORIN CALCIUM INJECTION 350 MG
400.0000 mg/m2 | Freq: Once | INTRAVENOUS | Status: AC
  Administered 2019-01-08: 14:00:00 716 mg via INTRAVENOUS
  Filled 2019-01-08: qty 35.8

## 2019-01-08 MED ORDER — FLUOROURACIL CHEMO INJECTION 2.5 GM/50ML
400.0000 mg/m2 | Freq: Once | INTRAVENOUS | Status: AC
  Administered 2019-01-08: 16:00:00 700 mg via INTRAVENOUS
  Filled 2019-01-08: qty 14

## 2019-01-08 MED ORDER — SODIUM CHLORIDE 0.9% FLUSH
10.0000 mL | INTRAVENOUS | Status: DC | PRN
  Administered 2019-01-08: 11:00:00 10 mL
  Filled 2019-01-08: qty 10

## 2019-01-08 MED ORDER — HEPARIN SOD (PORK) LOCK FLUSH 100 UNIT/ML IV SOLN
500.0000 [IU] | Freq: Once | INTRAVENOUS | Status: DC | PRN
  Filled 2019-01-08: qty 5

## 2019-01-08 MED ORDER — INFLUENZA VAC A&B SA ADJ QUAD 0.5 ML IM PRSY
0.5000 mL | PREFILLED_SYRINGE | Freq: Once | INTRAMUSCULAR | Status: AC
  Administered 2019-01-08: 0.5 mL via INTRAMUSCULAR

## 2019-01-08 NOTE — Progress Notes (Signed)
[facility] OFFICE PROGRESS NOTE   Diagnosis: Colon cancer  INTERVAL HISTORY:   Miguel Gamble completed another cycle of FOLFIRI on 12/18/2018.  He tolerated the chemotherapy well.  No nausea/vomiting, mouth sores, or diarrhea.  Neuropathy symptoms are improving. He underwent exchange of ureteral stents Dr. Alinda Money on 01/01/2019.  He reports tolerating procedure well.  No bleeding.  Objective:  Vital signs in last 24 hours:  Blood pressure (!) 148/75, pulse 73, temperature 97.8 F (36.6 C), temperature source Oral, resp. rate 17, height _0  (1.727 m), weight 151 lb 8 oz (68.7 kg), SpO2 100 %.    HEENT: No thrush or ulcers GI: No hepatomegaly, no mass, nontender, left lower quadrant colostomy with brown stool Vascular: No leg edema    Portacath/PICC-without erythema  Lab Results:  Lab Results  Component Value Date   WBC 7.7 01/08/2019   HGB 7.8 (L) 01/08/2019   HCT 25.7 (L) 01/08/2019   MCV 84.8 01/08/2019   PLT 323 01/08/2019   NEUTROABS 4.9 01/08/2019    CMP  Lab Results  Component Value Date   NA 143 01/08/2019   K 3.9 01/08/2019   CL 114 (H) 01/08/2019   CO2 23 01/08/2019   GLUCOSE 109 (H) 01/08/2019   BUN 19 01/08/2019   CREATININE 1.73 (H) 01/08/2019   CALCIUM 8.5 (L) 01/08/2019   PROT 6.2 (L) 01/08/2019   ALBUMIN 3.2 (L) 01/08/2019   AST 15 01/08/2019   ALT 8 01/08/2019   ALKPHOS 104 01/08/2019   BILITOT 0.3 01/08/2019   GFRNONAA 39 (L) 01/08/2019   GFRAA 46 (L) 01/08/2019    Lab Results  Component Value Date   CEA1 35.85 (H) 12/18/2018     Medications: I have reviewed the patient's current medications.   Assessment/Plan:  1. Metastatic colon cancer-abdomen/pelvic mass appears to arise from the sigmoid colon  Noncontrast CT 04/14/2018-abdomen/pelvic mass, partial colonic and small bowel obstruction, fistula between the mass and small bowel loops, multiple bladder tumors, left hydronephrosis, bilateral lung metastases, lymphatic  tumor spread in the lungs versus pneumonia, precarinal lymph node, retroperitoneal/mesenteric lymphadenopathy  Resection of multiple bladder masses 04/15/2018-adenocarcinoma consistent with a colonic primary, MSS,tumor mutation burden 10, KRAS G12D, No BRAF mutation  Cycle 1 FOLFOX 05/07/2018  Cycle 2 FOLFOX 05/22/2018  Cycle 3 FOLFOX 06/05/2018 (oxaliplatin dose reduced due to increased creatinine)  Cycle 4 FOLFOX 06/18/2018  Cycle 5 FOLFOX 07/03/2018  CT 07/09/2018-no significant change in lung nodules, resolution ofairspace disease, decreased size of liver metastases, near complete resolution of bowel obstruction, new diverting colostomy  Cycle 6 FOLFOX 07/17/2018  Cycle 7 FOLFOX 07/31/2018  Cycle 8 FOLFOX 08/14/2018  Cycle 9 FOLFOX 08/28/2018 (oxaliplatin held due to mild neutropenia)  Cycle 10 FOLFOX 09/11/2018  CT 09/22/2018-enlargement of lung nodules, stable indeterminate liver lesions, similar mass in the distal sigmoid colon, 17 mm polypoid lesion seen in the left bladder, persistent small bowel distention proximal to an area of small bowel involvement by direct tumor extension  Cycle 1 FOLFIRI 10/09/2018  Cycle 2 FOLFIRI 10/23/2018  Cycle 3 FOLFIRI 11/05/2018  Cycle 4 FOLFIRI 11/20/2018-Udenyca added  Cycle 5 FOLFIRI 12/04/2018  CTs 12/16/2018-mild interval improvement in bilateral pulmonary metastases.  Stable mild right paratracheal lymphadenopathy.  Mild decrease in size of dominant liver metastasis hepatic dome.  Stable subcentimeter lymph node small bowel mesentery and left periaortic region.  No significant change in bulky central pelvic soft tissue mass involving the sigmoid colon.  Stable mild diffuse bladder wall thickening.  Decreased size  of intraluminal soft tissue density involving the left posterior bladder wall.  No new or progressive metastatic disease identified.  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 He will follow-up with Dr. Ninfa Linden. Status post right inguinal hernia repair with mesh 06/23/2018.  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  Oxaliplatin neuropathy-improved    Disposition: Mr. Towry appears stable.  He is tolerating the FOLFIRI well.  Some disease progression.  He will complete another cycle of FOLFIRI today.  He will undergo restaging CTs after cycle 10.  I discussed treatment plans with Mr. Bara and his wife today.  We will consider maintenance therapy with capecitabine plus/minus Avastin depending on the restaging CT result.  He will receive an influenza vaccine today.  Mr. Maull will return for an office visit and chemotherapy in 2 weeks.  Betsy Coder, MD  01/08/2019  12:21 PM

## 2019-01-08 NOTE — Patient Instructions (Signed)
[facility] Discharge Instructions for Patients Receiving Chemotherapy  Today you received the following chemotherapy agents:  Irinotecan, Leucovorin, and 5FU.  To help prevent nausea and vomiting after your treatment, we encourage you to take your nausea medication as directed.   If you develop nausea and vomiting that is not controlled by your nausea medication, call the clinic.   BELOW ARE SYMPTOMS THAT SHOULD BE REPORTED IMMEDIATELY:  *FEVER GREATER THAN 100.5 F  *CHILLS WITH OR WITHOUT FEVER  NAUSEA AND VOMITING THAT IS NOT CONTROLLED WITH YOUR NAUSEA MEDICATION  *UNUSUAL SHORTNESS OF BREATH  *UNUSUAL BRUISING OR BLEEDING  TENDERNESS IN MOUTH AND THROAT WITH OR WITHOUT PRESENCE OF ULCERS  *URINARY PROBLEMS  *BOWEL PROBLEMS  UNUSUAL RASH Items with * indicate a potential emergency and should be followed up as soon as possible.  Feel free to call the clinic should you have any questions or concerns. The clinic phone number is [phone].  Please show the [facility] at check-in to the Emergency Department and triage nurse.  Influenza (Flu) Vaccine (Inactivated or Recombinant): What You Need to Know 1. Why get vaccinated? Influenza vaccine can prevent influenza (flu). Flu is a contagious disease that spreads around the [place] every year, usually between October and May. Anyone can get the flu, but it is more dangerous for some people. Infants and young children, people 58 years of age and older, pregnant women, and people with certain health conditions or a weakened immune system are at greatest risk of flu complications. Pneumonia, bronchitis, sinus infections and ear infections are examples of flu-related complications. If you have a medical condition, such as heart disease, cancer or diabetes, flu can make it worse. Flu can cause fever and chills, sore throat, muscle aches, fatigue, cough, headache, and runny or stuffy nose. Some people may  have vomiting and diarrhea, though this is more common in children than adults. Each year thousands of people in the [place] die from flu, and many more are hospitalized. Flu vaccine prevents millions of illnesses and flu-related visits to the doctor each year. 2. Influenza vaccine CDC recommends everyone 51 months of age and older get vaccinated every flu season. Children 6 months through 4 years of age may need 2 doses during a single flu season. Everyone else needs only 1 dose each flu season. It takes about 2 weeks for protection to develop after vaccination. There are many flu viruses, and they are always changing. Each year a new flu vaccine is made to protect against three or four viruses that are likely to cause disease in the upcoming flu season. Even when the vaccine doesn't exactly match these viruses, it may still provide some protection. Influenza vaccine does not cause flu. Influenza vaccine may be given at the same time as other vaccines. 3. Talk with your health care provider Tell your vaccine provider if the person getting the vaccine:  Has had an allergic reaction after a previous dose of influenza vaccine, or has any severe, life-threatening allergies.  Has ever had Guillain-Barr Syndrome (also called GBS). In some cases, your health care provider may decide to postpone influenza vaccination to a future visit. People with minor illnesses, such as a cold, may be vaccinated. People who are moderately or severely ill should usually wait until they recover before getting influenza vaccine. Your health care provider can give you more information. 4. Risks of a vaccine reaction  Soreness, redness, and swelling where shot is given, fever, muscle aches,  and headache can happen after influenza vaccine.  There may be a very small increased risk of Guillain-Barr Syndrome (GBS) after inactivated influenza vaccine (the flu shot). Young children who get the flu shot along with  pneumococcal vaccine (PCV13), and/or DTaP vaccine at the same time might be slightly more likely to have a seizure caused by fever. Tell your health care provider if a child who is getting flu vaccine has ever had a seizure. People sometimes faint after medical procedures, including vaccination. Tell your provider if you feel dizzy or have vision changes or ringing in the ears. As with any medicine, there is a very remote chance of a vaccine causing a severe allergic reaction, other serious injury, or death. 5. What if there is a serious problem? An allergic reaction could occur after the vaccinated person leaves the clinic. If you see signs of a severe allergic reaction (hives, swelling of the face and throat, difficulty breathing, a fast heartbeat, dizziness, or weakness), call 9-1-1 and get the person to the nearest hospital. For other signs that concern you, call your health care provider. Adverse reactions should be reported to the Vaccine Adverse Event Reporting System (VAERS). Your health care provider will usually file this report, or you can do it yourself. Visit the VAERS website at www.vaers.SamedayNews.es or call 3328850434.VAERS is only for reporting reactions, and VAERS staff do not give medical advice. 6. The National Vaccine Injury Compensation Program The [employer] Vaccine Injury Compensation Program (VICP) is a federal program that was created to compensate people who may have been injured by certain vaccines. Visit the VICP website at GoldCloset.com.ee or call 367-296-8161 to learn about the program and about filing a claim. There is a time limit to file a claim for compensation. 7. How can I learn more?  Ask your healthcare provider.  Call your local or state health department.  Contact the Centers for Disease Control and Prevention (CDC): ? Call (863) 813-3535 (1-800-CDC-INFO) or ? Visit CDC's https://gibson.com/ Vaccine Information Statement (Interim) Inactivated  Influenza Vaccine (11/28/2017) This information is not intended to replace advice given to you by your health care provider. Make sure you discuss any questions you have with your health care provider. Document Released: 01/25/2006 Document Revised: 07/22/2018 Document Reviewed: 12/02/2017 Elsevier Patient Education  2020 [employer].

## 2019-01-08 NOTE — Progress Notes (Signed)
Per Dr. Benay Spice: OK to treat today with Hgb 7.8 and Creatinine 1.73

## 2019-01-09 ENCOUNTER — Telehealth: Payer: Self-pay | Admitting: Oncology

## 2019-01-09 NOTE — Telephone Encounter (Signed)
Called and left msg. Mailed printout  °

## 2019-01-10 ENCOUNTER — Other Ambulatory Visit: Payer: Self-pay

## 2019-01-10 ENCOUNTER — Inpatient Hospital Stay: Payer: PRIVATE HEALTH INSURANCE

## 2019-01-10 VITALS — BP 111/63 | HR 89 | Temp 98.3°F | Resp 18

## 2019-01-10 DIAGNOSIS — Z5111 Encounter for antineoplastic chemotherapy: Secondary | ICD-10-CM | POA: Diagnosis not present

## 2019-01-10 DIAGNOSIS — C187 Malignant neoplasm of sigmoid colon: Secondary | ICD-10-CM

## 2019-01-10 MED ORDER — HEPARIN SOD (PORK) LOCK FLUSH 100 UNIT/ML IV SOLN
500.0000 [IU] | Freq: Once | INTRAVENOUS | Status: AC | PRN
  Administered 2019-01-10: 500 [IU]
  Filled 2019-01-10: qty 5

## 2019-01-10 MED ORDER — SODIUM CHLORIDE 0.9% FLUSH
10.0000 mL | INTRAVENOUS | Status: DC | PRN
  Administered 2019-01-10: 13:00:00 10 mL
  Filled 2019-01-10: qty 10

## 2019-01-10 MED ORDER — PEGFILGRASTIM-CBQV 6 MG/0.6ML ~~LOC~~ SOSY
6.0000 mg | PREFILLED_SYRINGE | Freq: Once | SUBCUTANEOUS | Status: AC
  Administered 2019-01-10: 13:00:00 6 mg via SUBCUTANEOUS

## 2019-01-10 NOTE — Patient Instructions (Signed)

## 2019-01-18 ENCOUNTER — Other Ambulatory Visit: Payer: Self-pay | Admitting: Oncology

## 2019-01-22 ENCOUNTER — Inpatient Hospital Stay: Payer: PRIVATE HEALTH INSURANCE

## 2019-01-22 ENCOUNTER — Other Ambulatory Visit: Payer: Self-pay

## 2019-01-22 ENCOUNTER — Inpatient Hospital Stay: Payer: PRIVATE HEALTH INSURANCE | Attending: Nurse Practitioner | Admitting: Oncology

## 2019-01-22 ENCOUNTER — Telehealth: Payer: Self-pay | Admitting: *Deleted

## 2019-01-22 VITALS — BP 113/76 | HR 109 | Temp 98.2°F | Resp 17 | Ht 68.0 in | Wt 135.4 lb

## 2019-01-22 DIAGNOSIS — Z95828 Presence of other vascular implants and grafts: Secondary | ICD-10-CM

## 2019-01-22 DIAGNOSIS — G620 Drug-induced polyneuropathy: Secondary | ICD-10-CM | POA: Diagnosis not present

## 2019-01-22 DIAGNOSIS — Z7689 Persons encountering health services in other specified circumstances: Secondary | ICD-10-CM | POA: Diagnosis not present

## 2019-01-22 DIAGNOSIS — R197 Diarrhea, unspecified: Secondary | ICD-10-CM | POA: Diagnosis not present

## 2019-01-22 DIAGNOSIS — C187 Malignant neoplasm of sigmoid colon: Secondary | ICD-10-CM | POA: Insufficient documentation

## 2019-01-22 DIAGNOSIS — C772 Secondary and unspecified malignant neoplasm of intra-abdominal lymph nodes: Secondary | ICD-10-CM

## 2019-01-22 DIAGNOSIS — R1900 Intra-abdominal and pelvic swelling, mass and lump, unspecified site: Secondary | ICD-10-CM | POA: Insufficient documentation

## 2019-01-22 DIAGNOSIS — C787 Secondary malignant neoplasm of liver and intrahepatic bile duct: Secondary | ICD-10-CM | POA: Insufficient documentation

## 2019-01-22 DIAGNOSIS — E860 Dehydration: Secondary | ICD-10-CM

## 2019-01-22 DIAGNOSIS — Z5111 Encounter for antineoplastic chemotherapy: Secondary | ICD-10-CM | POA: Insufficient documentation

## 2019-01-22 DIAGNOSIS — R630 Anorexia: Secondary | ICD-10-CM | POA: Insufficient documentation

## 2019-01-22 DIAGNOSIS — Z7901 Long term (current) use of anticoagulants: Secondary | ICD-10-CM | POA: Diagnosis not present

## 2019-01-22 DIAGNOSIS — Z86718 Personal history of other venous thrombosis and embolism: Secondary | ICD-10-CM | POA: Insufficient documentation

## 2019-01-22 LAB — CBC WITH DIFFERENTIAL (CANCER CENTER ONLY)
Abs Immature Granulocytes: 0.04 10*3/uL (ref 0.00–0.07)
Basophils Absolute: 0.1 10*3/uL (ref 0.0–0.1)
Basophils Relative: 1 %
Eosinophils Absolute: 0.2 10*3/uL (ref 0.0–0.5)
Eosinophils Relative: 3 %
HCT: 28.9 % — ABNORMAL LOW (ref 39.0–52.0)
Hemoglobin: 9.1 g/dL — ABNORMAL LOW (ref 13.0–17.0)
Immature Granulocytes: 1 %
Lymphocytes Relative: 16 %
Lymphs Abs: 0.9 10*3/uL (ref 0.7–4.0)
MCH: 25.6 pg — ABNORMAL LOW (ref 26.0–34.0)
MCHC: 31.5 g/dL (ref 30.0–36.0)
MCV: 81.4 fL (ref 80.0–100.0)
Monocytes Absolute: 1.6 10*3/uL — ABNORMAL HIGH (ref 0.1–1.0)
Monocytes Relative: 27 %
Neutro Abs: 3.2 10*3/uL (ref 1.7–7.7)
Neutrophils Relative %: 52 %
Platelet Count: 349 10*3/uL (ref 150–400)
RBC: 3.55 MIL/uL — ABNORMAL LOW (ref 4.22–5.81)
RDW: 20.6 % — ABNORMAL HIGH (ref 11.5–15.5)
WBC Count: 6 10*3/uL (ref 4.0–10.5)
nRBC: 0 % (ref 0.0–0.2)

## 2019-01-22 LAB — CMP (CANCER CENTER ONLY)
ALT: 16 U/L (ref 0–44)
AST: 21 U/L (ref 15–41)
Albumin: 2.9 g/dL — ABNORMAL LOW (ref 3.5–5.0)
Alkaline Phosphatase: 91 U/L (ref 38–126)
Anion gap: 13 (ref 5–15)
BUN: 56 mg/dL — ABNORMAL HIGH (ref 8–23)
CO2: 20 mmol/L — ABNORMAL LOW (ref 22–32)
Calcium: 8.9 mg/dL (ref 8.9–10.3)
Chloride: 103 mmol/L (ref 98–111)
Creatinine: 2.94 mg/dL — ABNORMAL HIGH (ref 0.61–1.24)
GFR, Est AFR Am: 24 mL/min — ABNORMAL LOW (ref >60)
GFR, Estimated: 21 mL/min — ABNORMAL LOW (ref >60)
Glucose, Bld: 127 mg/dL — ABNORMAL HIGH (ref 70–99)
Potassium: 3.8 mmol/L (ref 3.5–5.1)
Sodium: 136 mmol/L (ref 135–145)
Total Bilirubin: 0.4 mg/dL (ref 0.3–1.2)
Total Protein: 6.6 g/dL (ref 6.5–8.1)

## 2019-01-22 LAB — CEA (IN HOUSE-CHCC): CEA (CHCC-In House): 31.51 ng/mL — ABNORMAL HIGH (ref 0.00–5.00)

## 2019-01-22 MED ORDER — HEPARIN SOD (PORK) LOCK FLUSH 100 UNIT/ML IV SOLN
500.0000 [IU] | Freq: Once | INTRAVENOUS | Status: AC | PRN
  Administered 2019-01-22: 500 [IU]
  Filled 2019-01-22: qty 5

## 2019-01-22 MED ORDER — SODIUM CHLORIDE 0.9% FLUSH
10.0000 mL | INTRAVENOUS | Status: DC | PRN
  Administered 2019-01-22: 10 mL
  Filled 2019-01-22: qty 10

## 2019-01-22 MED ORDER — DIPHENOXYLATE-ATROPINE 2.5-0.025 MG PO TABS: 1.0000 | ORAL_TABLET | Freq: Four times a day (QID) | ORAL | 1 refills | Status: AC | PRN

## 2019-01-22 MED ORDER — SODIUM CHLORIDE 0.9 % IV SOLN
Freq: Once | INTRAVENOUS | Status: AC
  Administered 2019-01-22: 11:00:00 via INTRAVENOUS
  Filled 2019-01-22: qty 250

## 2019-01-22 NOTE — Patient Instructions (Signed)
Dehydration, Adult ° °Dehydration is when there is not enough fluid or water in your body. This happens when you lose more fluids than you take in. Dehydration can range from mild to very bad. It should be treated right away to keep it from getting very bad. °Symptoms of mild dehydration may include: °· Thirst. °· Dry lips. °· Slightly dry mouth. °· Dry, warm skin. °· Dizziness. °Symptoms of moderate dehydration may include: °· Very dry mouth. °· Muscle cramps. °· Dark pee (urine). Pee may be the color of tea. °· Your body making less pee. °· Your eyes making fewer tears. °· Heartbeat that is uneven or faster than normal (palpitations). °· Headache. °· Light-headedness, especially when you stand up from sitting. °· Fainting (syncope). °Symptoms of very bad dehydration may include: °· Changes in skin, such as: °? Cold and clammy skin. °? Blotchy (mottled) or pale skin. °? Skin that does not quickly return to normal after being lightly pinched and let go (poor skin turgor). °· Changes in body fluids, such as: °? Feeling very thirsty. °? Your eyes making fewer tears. °? Not sweating when body temperature is high, such as in hot weather. °? Your body making very little pee. °· Changes in vital signs, such as: °? Weak pulse. °? Pulse that is more than 100 beats a minute when you are sitting still. °? Fast breathing. °? Low blood pressure. °· Other changes, such as: °? Sunken eyes. °? Cold hands and feet. °? Confusion. °? Lack of energy (lethargy). °? Trouble waking up from sleep. °? Short-term weight loss. °? Unconsciousness. °Follow these instructions at home: ° °· If told by your doctor, drink an ORS: °? Make an ORS by using instructions on the package. °? Start by drinking small amounts, about ½ cup (120 mL) every 5-10 minutes. °? Slowly drink more until you have had the amount that your doctor said to have. °· Drink enough clear fluid to keep your pee clear or pale yellow. If you were told to drink an ORS, finish the  ORS first, then start slowly drinking clear fluids. Drink fluids such as: °? Water. Do not drink only water by itself. Doing that can make the salt (sodium) level in your body get too low (hyponatremia). °? Ice chips. °? Fruit juice that you have added water to (diluted). °? Low-calorie sports drinks. °· Avoid: °? Alcohol. °? Drinks that have a lot of sugar. These include high-calorie sports drinks, fruit juice that does not have water added, and soda. °? Caffeine. °? Foods that are greasy or have a lot of fat or sugar. °· Take over-the-counter and prescription medicines only as told by your doctor. °· Do not take salt tablets. Doing that can make the salt level in your body get too high (hypernatremia). °· Eat foods that have minerals (electrolytes). Examples include bananas, oranges, potatoes, tomatoes, and spinach. °· Keep all follow-up visits as told by your doctor. This is important. °Contact a doctor if: °· You have belly (abdominal) pain that: °? Gets worse. °? Stays in one area (localizes). °· You have a rash. °· You have a stiff neck. °· You get angry or annoyed more easily than normal (irritability). °· You are more sleepy than normal. °· You have a harder time waking up than normal. °· You feel: °? Weak. °? Dizzy. °? Very thirsty. °· You have peed (urinated) only a small amount of very dark pee during 6-8 hours. °Get help right away if: °· You have   symptoms of very bad dehydration. °· You cannot drink fluids without throwing up (vomiting). °· Your symptoms get worse with treatment. °· You have a fever. °· You have a very bad headache. °· You are throwing up or having watery poop (diarrhea) and it: °? Gets worse. °? Does not go away. °· You have blood or something green (bile) in your throw-up. °· You have blood in your poop (stool). This may cause poop to look black and tarry. °· You have not peed in 6-8 hours. °· You pass out (faint). °· Your heart rate when you are sitting still is more than 100 beats a  minute. °· You have trouble breathing. °This information is not intended to replace advice given to you by your health care provider. Make sure you discuss any questions you have with your health care provider. °Document Released: 01/27/2009 Document Revised: 03/15/2017 Document Reviewed: 05/27/2015 °Elsevier Patient Education © 2020 [employer]Coronavirus (COVID-19) Are you at risk? ° °Are you at risk for the Coronavirus (COVID-19)? ° °To be considered HIGH RISK for Coronavirus (COVID-19), you have to meet the following criteria: ° °• Traveled to [place], [place], [place], [place] or [place]; or in the [place] to [place], [place], [place], or [place]; and have fever, cough, and shortness of breath within the last 2 weeks of travel OR °• Been in close contact with a person diagnosed with COVID-19 within the last 2 weeks and have fever, cough, and shortness of breath °• IF YOU DO NOT MEET THESE CRITERIA, YOU ARE CONSIDERED LOW RISK FOR COVID-19. ° °What to do if you are HIGH RISK for COVID-19? ° °• If you are having a medical emergency, call 911. °• Seek medical care right away. Before you go to a doctor’s office, urgent care or emergency department, call ahead and tell them about your recent travel, contact with someone diagnosed with COVID-19, and your symptoms. You should receive instructions from your physician’s office regarding next steps of care.  °• When you arrive at healthcare provider, tell the healthcare staff immediately you have returned from visiting [place], [place], [place], [place] or [place]; or traveled in the [place] to [place], [place], [place], or [place]; in the last two weeks or you have been in close contact with a person diagnosed with COVID-19 in the last 2 weeks.   °• Tell the health care staff about your symptoms: fever, cough and shortness of breath. °• After you have been seen by a medical provider, you will be either: °o Tested for (COVID-19) and discharged  home on quarantine except to seek medical care if symptoms worsen, and asked to  °- Stay home and avoid contact with others until you get your results (4-5 days)  °- Avoid travel on public transportation if possible (such as bus, train, or airplane) or °o Sent to the Emergency Department by EMS for evaluation, COVID-19 testing, and possible admission depending on your condition and test results. ° °What to do if you are LOW RISK for COVID-19? ° °Reduce your risk of any infection by using the same precautions used for avoiding the common cold or flu:  °• Wash your hands often with soap and warm water for at least 20 seconds.  If soap and water are not readily available, use an alcohol-based hand sanitizer with at least 60% alcohol.  °• If coughing or sneezing, cover your mouth and nose by coughing or sneezing into the elbow areas of your shirt or   coat, into a tissue or into your sleeve (not your hands). °• Avoid shaking hands with others and consider head nods or verbal greetings only. °• Avoid touching your eyes, nose, or mouth with unwashed hands.  °• Avoid close contact with people who are sick. °• Avoid places or events with large numbers of people in one location, like concerts or sporting events. °• Carefully consider travel plans you have or are making. °• If you are planning any travel outside or inside the [place], visit the CDC’s Travelers’ Health webpage for the latest health notices. °• If you have some symptoms but not all symptoms, continue to monitor at home and seek medical attention if your symptoms worsen. °• If you are having a medical emergency, call 911. ° ° °ADDITIONAL HEALTHCARE OPTIONS FOR PATIENTS ° °[facility] e-Visit: [email or web address] °        °[facility]: [phone] ° °[facility]: [phone]          °         °[facility]: [phone]  °

## 2019-01-22 NOTE — Progress Notes (Signed)
[facility] OFFICE PROGRESS NOTE   Diagnosis: Colon cancer  INTERVAL HISTORY:   Ms. Miguel Gamble completed another cycle of FOLFIRI on 01/08/2019.  He reports diarrhea for the past week.  The frequency of emptying the colostomy has not changed, but the stool is loose.  He has felt lightheaded at times.  No other complaint.  Objective:  Vital signs in last 24 hours:  Blood pressure 113/76, pulse (!) 109, temperature 98.2 F (36.8 C), temperature source Temporal, resp. rate 17, height _0  (1.727 m), weight 135 lb 6.4 oz (61.4 kg), SpO2 100 %.    HEENT: The mouth is moist, no thrush or ulcers Resp: Lungs clear bilaterally Cardio: Regular rate and rhythm GI: No hepatomegaly, left lower quadrant-bag empty Vascular: No leg edema, diminished skin turgor    Portacath/PICC-without erythema  Lab Results:  Lab Results  Component Value Date   WBC 6.0 01/22/2019   HGB 9.1 (L) 01/22/2019   HCT 28.9 (L) 01/22/2019   MCV 81.4 01/22/2019   PLT 349 01/22/2019   NEUTROABS 3.2 01/22/2019    CMP  Lab Results  Component Value Date   NA 136 01/22/2019   K 3.8 01/22/2019   CL 103 01/22/2019   CO2 20 (L) 01/22/2019   GLUCOSE 127 (H) 01/22/2019   BUN 56 (H) 01/22/2019   CREATININE 2.94 (H) 01/22/2019   CALCIUM 8.9 01/22/2019   PROT 6.6 01/22/2019   ALBUMIN 2.9 (L) 01/22/2019   AST 21 01/22/2019   ALT 16 01/22/2019   ALKPHOS 91 01/22/2019   BILITOT 0.4 01/22/2019   GFRNONAA 21 (L) 01/22/2019   GFRAA 24 (L) 01/22/2019    Lab Results  Component Value Date   CEA1 31.51 (H) 01/22/2019     Medications: I have reviewed the patient's current medications.   Assessment/Plan: 1.Metastatic colon cancer-abdomen/pelvic mass appears to arise from the sigmoid colon  Noncontrast CT 04/14/2018-abdomen/pelvic mass, partial colonic and small bowel obstruction, fistula between the mass and small bowel loops, multiple bladder tumors, left hydronephrosis, bilateral lung  metastases, lymphatic tumor spread in the lungs versus pneumonia, precarinal lymph node, retroperitoneal/mesenteric lymphadenopathy  Resection of multiple bladder masses 04/15/2018-adenocarcinoma consistent with a colonic primary, MSS,tumor mutation burden 10, KRAS G12D, No BRAF mutation  Cycle 1 FOLFOX 05/07/2018  Cycle 2 FOLFOX 05/22/2018  Cycle 3 FOLFOX 06/05/2018 (oxaliplatin dose reduced due to increased creatinine)  Cycle 4 FOLFOX 06/18/2018  Cycle 5 FOLFOX 07/03/2018  CT 07/09/2018-no significant change in lung nodules, resolution ofairspace disease, decreased size of liver metastases, near complete resolution of bowel obstruction, new diverting colostomy  Cycle 6 FOLFOX 07/17/2018  Cycle 7 FOLFOX 07/31/2018  Cycle 8 FOLFOX 08/14/2018  Cycle 9 FOLFOX 08/28/2018 (oxaliplatin held due to mild neutropenia)  Cycle 10 FOLFOX 09/11/2018  CT 09/22/2018-enlargement of lung nodules, stable indeterminate liver lesions, similar mass in the distal sigmoid colon, 17 mm polypoid lesion seen in the left bladder, persistent small bowel distention proximal to an area of small bowel involvement by direct tumor extension  Cycle 1 FOLFIRI 10/09/2018  Cycle 2 FOLFIRI 10/23/2018  Cycle 3 FOLFIRI 11/05/2018  Cycle 4 FOLFIRI 11/20/2018-Udenyca added  Cycle 5 FOLFIRI 12/04/2018  CTs 12/16/2018-mild interval improvement in bilateral pulmonary metastases.  Stable mild right paratracheal lymphadenopathy.  Mild decrease in size of dominant liver metastasis hepatic dome.  Stable subcentimeter lymph node small bowel mesentery and left periaortic region.  No significant change in bulky central pelvic soft tissue mass involving the sigmoid colon.  Stable mild diffuse bladder wall  thickening.  Decreased size of intraluminal soft tissue density involving the left posterior bladder wall.  No new or progressive metastatic disease identified.  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 He will follow-up with Dr. Ninfa Linden. Status post right inguinal hernia repair with mesh 06/23/2018.  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  Oxaliplatin neuropathy-improved  9.  Diarrhea following cycle 7 FOLFIRI   Disposition: Ms. Miguel Gamble has completed 7 cycles of FOLFIRI.  He had diarrhea following most recent cycle of chemotherapy.  He appears dehydrated today.  He will receive intravenous fluids today.  We prescribed Lomotil for diarrhea.  Chemotherapy will be placed on hold.  He will contact us if the diarrhea has not improved tomorrow and we will arrange for additional intravenous fluids.  He will return for an office visit in the next cycle of chemotherapy in 1 week.  The irinotecan will be dose reduced and the 5-FU bolus eliminated.  Betsy Coder, MD  01/22/2019  2:11 PM

## 2019-01-22 NOTE — Progress Notes (Signed)
Per Dr. Benay Spice: No chemo today. Will hydrate only.

## 2019-01-22 NOTE — Telephone Encounter (Signed)
Called patient w/CEA results and MD feels slight elevation could be due to dehydration. Reminded him to call early in am if he can't get diarrhea under control.

## 2019-01-22 NOTE — Telephone Encounter (Signed)
-----   Message from Ladell Pier, MD sent at 01/22/2019 11:55 AM EDT ----- Please call patient, the CEA is slightly higher, likely related to dehydration

## 2019-01-23 ENCOUNTER — Telehealth: Payer: Self-pay | Admitting: *Deleted

## 2019-01-23 ENCOUNTER — Telehealth: Payer: Self-pay | Admitting: Oncology

## 2019-01-23 NOTE — Telephone Encounter (Signed)
Scheduled per 10/08 los, waiting for infusion add-on to be approved.

## 2019-01-23 NOTE — Telephone Encounter (Signed)
Called to report he has taken #2 Lomotil x 2 since yesterday afternoon and there has been a good reduction in the output from his ostomy. Did not have to get up during the night. Had small loose stool this morning. He says his energy is better today and he is able to drink water and Gatorade. Does not feel dizzy or lightheaded. Instructed him to call if diarrhea resumes.

## 2019-01-28 ENCOUNTER — Telehealth: Payer: Self-pay | Admitting: Oncology

## 2019-01-28 NOTE — Telephone Encounter (Signed)
Called patient to confirm upcoming appointments, calender will be mailed.

## 2019-01-29 ENCOUNTER — Inpatient Hospital Stay: Payer: PRIVATE HEALTH INSURANCE

## 2019-01-29 ENCOUNTER — Inpatient Hospital Stay (HOSPITAL_BASED_OUTPATIENT_CLINIC_OR_DEPARTMENT_OTHER): Payer: PRIVATE HEALTH INSURANCE | Admitting: Nurse Practitioner

## 2019-01-29 ENCOUNTER — Other Ambulatory Visit: Payer: PRIVATE HEALTH INSURANCE

## 2019-01-29 ENCOUNTER — Other Ambulatory Visit: Payer: Self-pay

## 2019-01-29 ENCOUNTER — Encounter: Payer: Self-pay | Admitting: Nurse Practitioner

## 2019-01-29 VITALS — HR 88

## 2019-01-29 VITALS — BP 110/65 | HR 105 | Temp 98.3°F | Resp 18 | Ht 68.0 in | Wt 140.2 lb

## 2019-01-29 DIAGNOSIS — C187 Malignant neoplasm of sigmoid colon: Secondary | ICD-10-CM

## 2019-01-29 DIAGNOSIS — C772 Secondary and unspecified malignant neoplasm of intra-abdominal lymph nodes: Secondary | ICD-10-CM

## 2019-01-29 DIAGNOSIS — Z95828 Presence of other vascular implants and grafts: Secondary | ICD-10-CM

## 2019-01-29 DIAGNOSIS — Z5111 Encounter for antineoplastic chemotherapy: Secondary | ICD-10-CM | POA: Diagnosis not present

## 2019-01-29 LAB — CMP (CANCER CENTER ONLY)
ALT: 12 U/L (ref 0–44)
AST: 19 U/L (ref 15–41)
Albumin: 2.6 g/dL — ABNORMAL LOW (ref 3.5–5.0)
Alkaline Phosphatase: 95 U/L (ref 38–126)
Anion gap: 9 (ref 5–15)
BUN: 19 mg/dL (ref 8–23)
CO2: 22 mmol/L (ref 22–32)
Calcium: 8.3 mg/dL — ABNORMAL LOW (ref 8.9–10.3)
Chloride: 108 mmol/L (ref 98–111)
Creatinine: 2.14 mg/dL — ABNORMAL HIGH (ref 0.61–1.24)
GFR, Est AFR Am: 35 mL/min — ABNORMAL LOW (ref >60)
GFR, Estimated: 30 mL/min — ABNORMAL LOW (ref >60)
Glucose, Bld: 117 mg/dL — ABNORMAL HIGH (ref 70–99)
Potassium: 3.9 mmol/L (ref 3.5–5.1)
Sodium: 139 mmol/L (ref 135–145)
Total Bilirubin: 0.4 mg/dL (ref 0.3–1.2)
Total Protein: 6 g/dL — ABNORMAL LOW (ref 6.5–8.1)

## 2019-01-29 LAB — CBC WITH DIFFERENTIAL (CANCER CENTER ONLY)
Abs Immature Granulocytes: 0.08 10*3/uL — ABNORMAL HIGH (ref 0.00–0.07)
Basophils Absolute: 0.1 10*3/uL (ref 0.0–0.1)
Basophils Relative: 1 %
Eosinophils Absolute: 0.3 10*3/uL (ref 0.0–0.5)
Eosinophils Relative: 2 %
HCT: 25.6 % — ABNORMAL LOW (ref 39.0–52.0)
Hemoglobin: 7.8 g/dL — ABNORMAL LOW (ref 13.0–17.0)
Immature Granulocytes: 1 %
Lymphocytes Relative: 9 %
Lymphs Abs: 1.2 10*3/uL (ref 0.7–4.0)
MCH: 26.1 pg (ref 26.0–34.0)
MCHC: 30.5 g/dL (ref 30.0–36.0)
MCV: 85.6 fL (ref 80.0–100.0)
Monocytes Absolute: 1.4 10*3/uL — ABNORMAL HIGH (ref 0.1–1.0)
Monocytes Relative: 10 %
Neutro Abs: 10.2 10*3/uL — ABNORMAL HIGH (ref 1.7–7.7)
Neutrophils Relative %: 77 %
Platelet Count: 400 10*3/uL (ref 150–400)
RBC: 2.99 MIL/uL — ABNORMAL LOW (ref 4.22–5.81)
RDW: 22.5 % — ABNORMAL HIGH (ref 11.5–15.5)
WBC Count: 13.2 10*3/uL — ABNORMAL HIGH (ref 4.0–10.5)
nRBC: 0 % (ref 0.0–0.2)

## 2019-01-29 LAB — MAGNESIUM: Magnesium: 1.8 mg/dL (ref 1.7–2.4)

## 2019-01-29 MED ORDER — SODIUM CHLORIDE 0.9 % IV SOLN
2400.0000 mg/m2 | INTRAVENOUS | Status: DC
  Administered 2019-01-29: 4300 mg via INTRAVENOUS
  Filled 2019-01-29: qty 86

## 2019-01-29 MED ORDER — IRINOTECAN HCL CHEMO INJECTION 100 MG/5ML
135.0000 mg/m2 | Freq: Once | INTRAVENOUS | Status: AC
  Administered 2019-01-29: 15:00:00 240 mg via INTRAVENOUS
  Filled 2019-01-29: qty 12

## 2019-01-29 MED ORDER — DEXAMETHASONE SODIUM PHOSPHATE 10 MG/ML IJ SOLN
INTRAMUSCULAR | Status: AC
  Filled 2019-01-29: qty 1

## 2019-01-29 MED ORDER — ATROPINE SULFATE 1 MG/ML IJ SOLN
INTRAMUSCULAR | Status: AC
  Filled 2019-01-29: qty 1

## 2019-01-29 MED ORDER — PALONOSETRON HCL INJECTION 0.25 MG/5ML
INTRAVENOUS | Status: AC
  Filled 2019-01-29: qty 5

## 2019-01-29 MED ORDER — DEXAMETHASONE SODIUM PHOSPHATE 10 MG/ML IJ SOLN
10.0000 mg | Freq: Once | INTRAMUSCULAR | Status: AC
  Administered 2019-01-29: 14:00:00 10 mg via INTRAVENOUS

## 2019-01-29 MED ORDER — ATROPINE SULFATE 1 MG/ML IJ SOLN
0.5000 mg | Freq: Once | INTRAMUSCULAR | Status: AC | PRN
  Administered 2019-01-29: 15:00:00 0.5 mg via INTRAVENOUS

## 2019-01-29 MED ORDER — SODIUM CHLORIDE 0.9 % IV SOLN
Freq: Once | INTRAVENOUS | Status: AC
  Administered 2019-01-29: 14:00:00 via INTRAVENOUS
  Filled 2019-01-29: qty 250

## 2019-01-29 MED ORDER — SODIUM CHLORIDE 0.9% FLUSH
10.0000 mL | INTRAVENOUS | Status: DC | PRN
  Filled 2019-01-29: qty 10

## 2019-01-29 MED ORDER — LEUCOVORIN CALCIUM INJECTION 350 MG
400.0000 mg/m2 | Freq: Once | INTRAVENOUS | Status: AC
  Administered 2019-01-29: 15:00:00 716 mg via INTRAVENOUS
  Filled 2019-01-29: qty 35.8

## 2019-01-29 MED ORDER — PALONOSETRON HCL INJECTION 0.25 MG/5ML
0.2500 mg | Freq: Once | INTRAVENOUS | Status: AC
  Administered 2019-01-29: 14:00:00 0.25 mg via INTRAVENOUS

## 2019-01-29 NOTE — Progress Notes (Signed)
Per Dr. Benay Spice, okay to disconnect patient from 5FU pump at 1330 on 01/31/2019 and waste remaining chemo.

## 2019-01-29 NOTE — Progress Notes (Signed)
Per Miguel Gamble ok to treat today with creatinine 2.14

## 2019-01-29 NOTE — Patient Instructions (Signed)

## 2019-01-29 NOTE — Progress Notes (Signed)
[facility] OFFICE PROGRESS NOTE   Diagnosis: Colon cancer  INTERVAL HISTORY:   Miguel Gamble returns as scheduled.  He completed a cycle of FOLFIRI 01/08/2019.  Treatment was held 01/22/2019 due to diarrhea/dehydration.  He is feeling much better.  Stool has returned to previous consistency.  His last dose of Lomotil was 2 days ago.  He denies nausea/vomiting.  No mouth sores.  No hand or foot pain or redness.  Neuropathy symptoms continue to improve.  He reports good fluid intake.  He no longer feels lightheaded.  Objective:  Vital signs in last 24 hours:  Blood pressure 110/65, pulse (!) 105, temperature 98.3 F (36.8 C), resp. rate 18, height 5' 8"  (1.727 m), weight 140 lb 3.2 oz (63.6 kg), SpO2 100 %.    HEENT: No thrush or ulcers.  Mucous membranes appear moist. GI: Abdomen soft and nontender.  No hepatomegaly.  Left lower quadrant colostomy. Vascular: No leg edema. Neuro: Alert and oriented. Skin: Palms without erythema. Port-A-Cath without erythema.   Lab Results:  Lab Results  Component Value Date   WBC 13.2 (H) 01/29/2019   HGB 7.8 (L) 01/29/2019   HCT 25.6 (L) 01/29/2019   MCV 85.6 01/29/2019   PLT 400 01/29/2019   NEUTROABS 10.2 (H) 01/29/2019    Imaging:  No results found.  Medications: I have reviewed the patient's current medications.  Assessment/Plan: 1.Metastatic colon cancer-abdomen/pelvic mass appears to arise from the sigmoid colon  Noncontrast CT 04/14/2018-abdomen/pelvic mass, partial colonic and small bowel obstruction, fistula between the mass and small bowel loops, multiple bladder tumors, left hydronephrosis, bilateral lung metastases, lymphatic tumor spread in the lungs versus pneumonia, precarinal lymph node, retroperitoneal/mesenteric lymphadenopathy  Resection of multiple bladder masses 04/15/2018-adenocarcinoma consistent with a colonic primary, MSS,tumor mutation burden 10, KRAS G12D, No BRAF mutation  Cycle 1 FOLFOX  05/07/2018  Cycle 2 FOLFOX 05/22/2018  Cycle 3 FOLFOX 06/05/2018 (oxaliplatin dose reduced due to increased creatinine)  Cycle 4 FOLFOX 06/18/2018  Cycle 5 FOLFOX 07/03/2018  CT 07/09/2018-no significant change in lung nodules, resolution ofairspace disease, decreased size of liver metastases, near complete resolution of bowel obstruction, new diverting colostomy  Cycle 6 FOLFOX 07/17/2018  Cycle 7 FOLFOX 07/31/2018  Cycle 8 FOLFOX 08/14/2018  Cycle 9 FOLFOX 08/28/2018 (oxaliplatin held due to mild neutropenia)  Cycle 10 FOLFOX 09/11/2018  CT 09/22/2018-enlargement of lung nodules, stable indeterminate liver lesions, similar mass in the distal sigmoid colon, 17 mm polypoid lesion seen in the left bladder, persistent small bowel distention proximal to an area of small bowel involvement by direct tumor extension  Cycle 1 FOLFIRI 10/09/2018  Cycle 2 FOLFIRI 10/23/2018  Cycle 3 FOLFIRI 11/05/2018  Cycle 4 FOLFIRI 11/20/2018-Udenyca added  Cycle 5 FOLFIRI 12/04/2018  CTs 12/16/2018-mild interval improvement in bilateral pulmonary metastases.  Stable mild right paratracheal lymphadenopathy.  Mild decrease in size of dominant liver metastasis hepatic dome.  Stable subcentimeter lymph node small bowel mesentery and left periaortic region.  No significant change in bulky central pelvic soft tissue mass involving the sigmoid colon.  Stable mild diffuse bladder wall thickening.  Decreased size of intraluminal soft tissue density involving the left posterior bladder wall.  No new or progressive metastatic disease identified.  Cycle 6 FOLFIRI 12/18/2018  Cycle 7 FOLFIRI 01/08/2019  Cycle 8 FOLFIRI 01/29/2019 (Irinotecan dose reduced, 5-FU bolus eliminated due to diarrhea)   2.Renal failure secondary to obstructive nephropathy  Placement of bilateral ureter stents 04/15/2018, exchanged 08/04/2018, exchanged  3.Anorexia/weight loss  4.Partial small bowel and  colonic obstruction  Diverting  transverse loop colostomy 04/17/2018  5.Right inguinal hernia. He will follow-up with Dr. Ninfa Linden. Status post right inguinal hernia repair with mesh 06/23/2018.  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 Oxaliplatin neuropathy-improved  9.  Diarrhea following cycle 7 FOLFIRI   Disposition: Miguel Gamble appears improved.  He is no longer having diarrhea and reports adequate fluid intake. The plan is to proceed with cycle 8 FOLFIRI today as scheduled.  Irinotecan has been dose reduced and the 5-FU bolus eliminated.    We reviewed the labs from today.  Counts adequate to proceed with treatment.  Creatinine improved, back to baseline.  He will return for lab, follow-up, FOLFIRI in 2 weeks.  He will contact the office in the interim with any problems.  We specifically discussed recurrent diarrhea.  Plan reviewed with Dr. Benay Spice.  Ned Card ANP/GNP-BC   01/29/2019  1:38 PM

## 2019-01-29 NOTE — Patient Instructions (Signed)
[facility] Discharge Instructions for Patients Receiving Chemotherapy  Today you received the following chemotherapy agents: Irinotecan/Leucovorin/5FU  To help prevent nausea and vomiting after your treatment, we encourage you to take your nausea medication as directed.   If you develop nausea and vomiting that is not controlled by your nausea medication, call the clinic.   BELOW ARE SYMPTOMS THAT SHOULD BE REPORTED IMMEDIATELY:  *FEVER GREATER THAN 100.5 F  *CHILLS WITH OR WITHOUT FEVER  NAUSEA AND VOMITING THAT IS NOT CONTROLLED WITH YOUR NAUSEA MEDICATION  *UNUSUAL SHORTNESS OF BREATH  *UNUSUAL BRUISING OR BLEEDING  TENDERNESS IN MOUTH AND THROAT WITH OR WITHOUT PRESENCE OF ULCERS  *URINARY PROBLEMS  *BOWEL PROBLEMS  UNUSUAL RASH Items with * indicate a potential emergency and should be followed up as soon as possible.  Feel free to call the clinic should you have any questions or concerns. The clinic phone number is [phone].  Please show the CHEMO ALERT CARD at check-in to the Emergency Department and triage nurse.   

## 2019-01-30 ENCOUNTER — Telehealth: Payer: Self-pay | Admitting: Oncology

## 2019-01-30 NOTE — Telephone Encounter (Signed)
I talk with patient he looked at my chart

## 2019-01-31 ENCOUNTER — Inpatient Hospital Stay: Payer: PRIVATE HEALTH INSURANCE

## 2019-01-31 ENCOUNTER — Other Ambulatory Visit: Payer: Self-pay

## 2019-01-31 VITALS — BP 119/71 | HR 71 | Temp 99.1°F | Resp 16

## 2019-01-31 DIAGNOSIS — Z5111 Encounter for antineoplastic chemotherapy: Secondary | ICD-10-CM | POA: Diagnosis not present

## 2019-01-31 DIAGNOSIS — C772 Secondary and unspecified malignant neoplasm of intra-abdominal lymph nodes: Secondary | ICD-10-CM

## 2019-01-31 DIAGNOSIS — C187 Malignant neoplasm of sigmoid colon: Secondary | ICD-10-CM

## 2019-01-31 MED ORDER — SODIUM CHLORIDE 0.9% FLUSH
10.0000 mL | INTRAVENOUS | Status: DC | PRN
  Administered 2019-01-31: 10 mL
  Filled 2019-01-31: qty 10

## 2019-01-31 MED ORDER — HEPARIN SOD (PORK) LOCK FLUSH 100 UNIT/ML IV SOLN
500.0000 [IU] | Freq: Once | INTRAVENOUS | Status: AC | PRN
  Administered 2019-01-31: 500 [IU]
  Filled 2019-01-31: qty 5

## 2019-01-31 MED ORDER — PEGFILGRASTIM-CBQV 6 MG/0.6ML ~~LOC~~ SOSY
6.0000 mg | PREFILLED_SYRINGE | Freq: Once | SUBCUTANEOUS | Status: AC
  Administered 2019-01-31: 6 mg via SUBCUTANEOUS

## 2019-01-31 NOTE — Patient Instructions (Signed)

## 2019-02-05 ENCOUNTER — Other Ambulatory Visit: Payer: PRIVATE HEALTH INSURANCE

## 2019-02-05 ENCOUNTER — Ambulatory Visit: Payer: PRIVATE HEALTH INSURANCE | Admitting: Nurse Practitioner

## 2019-02-05 ENCOUNTER — Ambulatory Visit: Payer: PRIVATE HEALTH INSURANCE

## 2019-02-07 ENCOUNTER — Other Ambulatory Visit: Payer: Self-pay | Admitting: Oncology

## 2019-02-11 ENCOUNTER — Inpatient Hospital Stay: Payer: PRIVATE HEALTH INSURANCE

## 2019-02-11 ENCOUNTER — Other Ambulatory Visit: Payer: Self-pay | Admitting: Emergency Medicine

## 2019-02-11 ENCOUNTER — Inpatient Hospital Stay (HOSPITAL_BASED_OUTPATIENT_CLINIC_OR_DEPARTMENT_OTHER): Payer: PRIVATE HEALTH INSURANCE | Admitting: Medical

## 2019-02-11 ENCOUNTER — Telehealth: Payer: Self-pay | Admitting: *Deleted

## 2019-02-11 ENCOUNTER — Telehealth: Payer: Self-pay | Admitting: Medical

## 2019-02-11 ENCOUNTER — Other Ambulatory Visit: Payer: Self-pay

## 2019-02-11 VITALS — BP 117/65 | HR 115 | Temp 99.9°F | Resp 20 | Ht 68.0 in | Wt 130.4 lb

## 2019-02-11 DIAGNOSIS — Z95828 Presence of other vascular implants and grafts: Secondary | ICD-10-CM

## 2019-02-11 DIAGNOSIS — C772 Secondary and unspecified malignant neoplasm of intra-abdominal lymph nodes: Secondary | ICD-10-CM

## 2019-02-11 DIAGNOSIS — R112 Nausea with vomiting, unspecified: Secondary | ICD-10-CM | POA: Diagnosis not present

## 2019-02-11 DIAGNOSIS — C187 Malignant neoplasm of sigmoid colon: Secondary | ICD-10-CM

## 2019-02-11 DIAGNOSIS — Z5111 Encounter for antineoplastic chemotherapy: Secondary | ICD-10-CM | POA: Diagnosis not present

## 2019-02-11 DIAGNOSIS — E860 Dehydration: Secondary | ICD-10-CM

## 2019-02-11 LAB — CMP (CANCER CENTER ONLY)
ALT: 14 U/L (ref 0–44)
AST: 16 U/L (ref 15–41)
Albumin: 3.1 g/dL — ABNORMAL LOW (ref 3.5–5.0)
Alkaline Phosphatase: 153 U/L — ABNORMAL HIGH (ref 38–126)
Anion gap: 27 — ABNORMAL HIGH (ref 5–15)
BUN: 39 mg/dL — ABNORMAL HIGH (ref 8–23)
CO2: 20 mmol/L — ABNORMAL LOW (ref 22–32)
Calcium: 9.2 mg/dL (ref 8.9–10.3)
Chloride: 100 mmol/L (ref 98–111)
Creatinine: 3.72 mg/dL (ref 0.61–1.24)
GFR, Est AFR Am: 18 mL/min — ABNORMAL LOW (ref >60)
GFR, Estimated: 16 mL/min — ABNORMAL LOW (ref >60)
Glucose, Bld: 177 mg/dL — ABNORMAL HIGH (ref 70–99)
Potassium: 3.9 mmol/L (ref 3.5–5.1)
Sodium: 147 mmol/L — ABNORMAL HIGH (ref 135–145)
Total Bilirubin: 0.8 mg/dL (ref 0.3–1.2)
Total Protein: 7.6 g/dL (ref 6.5–8.1)

## 2019-02-11 LAB — CEA (IN HOUSE-CHCC): CEA (CHCC-In House): 58.41 ng/mL — ABNORMAL HIGH (ref 0.00–5.00)

## 2019-02-11 LAB — CBC WITH DIFFERENTIAL (CANCER CENTER ONLY)
Abs Immature Granulocytes: 0.07 10*3/uL (ref 0.00–0.07)
Basophils Absolute: 0.1 10*3/uL (ref 0.0–0.1)
Basophils Relative: 0 %
Eosinophils Absolute: 0 10*3/uL (ref 0.0–0.5)
Eosinophils Relative: 0 %
HCT: 32.9 % — ABNORMAL LOW (ref 39.0–52.0)
Hemoglobin: 10 g/dL — ABNORMAL LOW (ref 13.0–17.0)
Immature Granulocytes: 1 %
Lymphocytes Relative: 5 %
Lymphs Abs: 0.7 10*3/uL (ref 0.7–4.0)
MCH: 25.8 pg — ABNORMAL LOW (ref 26.0–34.0)
MCHC: 30.4 g/dL (ref 30.0–36.0)
MCV: 84.8 fL (ref 80.0–100.0)
Monocytes Absolute: 1.6 10*3/uL — ABNORMAL HIGH (ref 0.1–1.0)
Monocytes Relative: 11 %
Neutro Abs: 11.9 10*3/uL — ABNORMAL HIGH (ref 1.7–7.7)
Neutrophils Relative %: 83 %
Platelet Count: 474 10*3/uL — ABNORMAL HIGH (ref 150–400)
RBC: 3.88 MIL/uL — ABNORMAL LOW (ref 4.22–5.81)
RDW: 21.5 % — ABNORMAL HIGH (ref 11.5–15.5)
WBC Count: 14.3 10*3/uL — ABNORMAL HIGH (ref 4.0–10.5)
nRBC: 0 % (ref 0.0–0.2)

## 2019-02-11 LAB — MAGNESIUM: Magnesium: 2 mg/dL (ref 1.7–2.4)

## 2019-02-11 MED ORDER — SODIUM CHLORIDE 0.9% FLUSH
10.0000 mL | INTRAVENOUS | Status: DC | PRN
  Administered 2019-02-11: 12:00:00 10 mL
  Filled 2019-02-11: qty 10

## 2019-02-11 MED ORDER — HEPARIN SOD (PORK) LOCK FLUSH 100 UNIT/ML IV SOLN
500.0000 [IU] | Freq: Once | INTRAVENOUS | Status: AC | PRN
  Administered 2019-02-11: 15:00:00 500 [IU]
  Filled 2019-02-11: qty 5

## 2019-02-11 MED ORDER — SODIUM CHLORIDE 0.9% FLUSH
10.0000 mL | INTRAVENOUS | Status: DC | PRN
  Administered 2019-02-11: 10 mL
  Filled 2019-02-11: qty 10

## 2019-02-11 MED ORDER — SODIUM CHLORIDE 0.9 % IV SOLN
INTRAVENOUS | Status: DC
  Administered 2019-02-11: 12:00:00 via INTRAVENOUS
  Filled 2019-02-11 (×2): qty 250

## 2019-02-11 MED ORDER — SODIUM CHLORIDE 0.9 % IV SOLN
INTRAVENOUS | Status: DC
  Filled 2019-02-11: qty 250

## 2019-02-11 NOTE — Telephone Encounter (Signed)
Scheduled apt per 10/28 sch message - pt aware of appt date and time

## 2019-02-11 NOTE — Patient Instructions (Signed)

## 2019-02-11 NOTE — Progress Notes (Signed)
Received critical value from lab at 1229: Creatinine 3.72. PA Lucianne Lei made aware.  Pt received 2L IVF NS today, tolerated well.  Reports feeling better at end of infusion.  PA Lucianne Lei aware of tachycardia and slightly elevated temp at time of d/c, ok to send pt home.  Pt advised to continue pushing home hydration and to check temperature regularly before returning to [facility] tomorrow, and to call back beforehand as needed for any questions or concerns.  Pt verbalized understanding of d/c and f/u instructions and of upcoming appt date/times.

## 2019-02-11 NOTE — Patient Instructions (Signed)

## 2019-02-11 NOTE — Telephone Encounter (Signed)
Called to report onset of uncontrolled N/V beginning yesterday. Has had 5-7 episodes of emesis in last 12 hours. Ostomy output is good--no pain. Denies fever, chills or body aches. Has not responded to compazine. Per Dr. Benay Spice : Have him seen in [facility]. Cleared with [facility] and high priority scheduling message sent w/request to call patient. Patient is aware.

## 2019-02-11 NOTE — Progress Notes (Signed)
[facility] Progress Note   Miguel Miguel:[id number] [birth year] 68 y.o.  Miguel Miguel is managed by Dr. Dominica Severin B. 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 Benay Spice and is status post cycle 8 of FOLFIRI with irinotecan dose reduced and 5-FU bolus eliminated due to diarrhea. He was last treated on 01/29/2019 and is scheduled to receive his next cycle of chemotherapy tomorrow.  Nausea and vomiting: Miguel Miguel was given 2 liters of normal saline IV today with improvement in his blood pressure and pulse rate today. He was encouraged to push fluids.  Please see After Visit Summary for patient specific instructions.  Future Appointments  Date Time Provider Stem  02/11/2019 11:00 AM [facility] LAB 1 [facility] None  02/11/2019 11:15 AM [facility] [place] FLUSH [facility] None  02/11/2019 11:30 AM Bernard Donahoo, Lucianne Lei E., PA-C [facility] None  02/12/2019  8:15 AM [facility] ONLY [facility] None  02/12/2019  8:30 AM [facility] [place] FLUSH [facility] None  02/12/2019  9:00 AM Ladell Pier, MD [facility] None  02/12/2019  9:30 AM [facility] INFUSION [facility] None  02/14/2019  1:30 PM [facility] [place] FLUSH [facility] None    No orders of the defined types were placed in this encounter.      Subjective:   Patient ID:  Miguel Miguel is a 68 y.o. (DOB 1950-09-13) male.  Chief Complaint: No chief complaint on file.   HPI Miguel Miguel  Is a 68 y.o. male with a diagnosis of a metastatic colon cancer. He is followed by Dr.  Benay Spice and is status post cycle 8 of FOLFIRI with irinotecan dose reduced and 5-FU bolus eliminated due to diarrhea which was dosed on 01/29/2019. He is scheduled to receive his next cycle of chemotherapy tomorrow. Miguel Miguel called this morning reporting that he had uncontrolled nausea and vomiting beginning yesterday. Has had 5-7 episodes of emesis in last 12 hours. He reports dizziness, weakness, and lightheadedness.  His ostomy output is good. He denies fever, chills, body aches, and abdominal pain. He has taken compazine without benefit. He has been using Imodium and Lomotil for loose stools. He continues to have soft stools but no diarrhea.  Medications: I have reviewed the patient's current medications.  Allergies:  Allergies  Allergen Reactions  . Other Rash    Wheat straw    Past Medical History:  Diagnosis Date  . Anemia   . Cancer [facility]) dx 15 Mar 2018   colon with kidney mets  . Right leg DVT ([place]) 2020    Past Surgical History:  Procedure Laterality Date  . COLOSTOMY N/A 04/17/2018   Procedure: COLOSTOMY;  Surgeon: [name], MD;  Location: [facility];  Service: General;  Laterality: N/A;  . CYSTOSCOPY W/ URETERAL STENT PLACEMENT Bilateral 04/15/2018   Procedure: CYSTOSCOPY BILATERAL WITH RETROGRADE PYELOGRAM/URETERAL BILATERAL STENT PLACEMENT WITH TRANSURETHRAL RESECTION OF BLADDER TUMOR;  Surgeon: Raynelle Bring, MD;  Location: [facility];  Service: Urology;  Laterality: Bilateral;  . CYSTOSCOPY W/ URETERAL STENT PLACEMENT Bilateral 08/04/2018   Procedure: CYSTOSCOPY WITH BILATERAL STENT EXCHANGE;  Surgeon: Raynelle Bring, MD;  Location: [facility];  Service: Urology;  Laterality: Bilateral;  .  CYSTOSCOPY WITH STENT PLACEMENT Bilateral 01/01/2019   Procedure: CYSTOSCOPY WITH STENT, RETROGRADE PYELOGRAM;  Surgeon: Raynelle Bring, MD;  Location: [facility];  Service: Urology;  Laterality: Bilateral;  . DENTAL SURGERY  2010   permanent full mounth dental implants  . INGUINAL HERNIA  REPAIR Right 06/23/2018   Procedure: RIGHT HERNIA REPAIR INGUINAL WITH MESH;  Surgeon: [name], MD;  Location: [facility];  Service: General;  Laterality: Right;  . IR IMAGING GUIDED PORT INSERTION  04/21/2018  . LAPAROTOMY N/A 04/17/2018   Procedure: EXPLORATORY LAPAROTOMY;  Surgeon: [name], MD;  Location: [facility];  Service: General;  Laterality: N/A;  . PORTA CATH INSERTION  04/2018    Family History  Problem Relation Age of Onset  . Lung cancer Mother   . Emphysema Father   . Heart failure Father     Social History   Socioeconomic History  . Marital status: Married    Spouse name: Not on file  . Number of children: Not on file  . Years of education: Not on file  . Highest education level: Not on file  Occupational History  . Not on file  Social Needs  . Financial resource strain: Not on file  . Food insecurity    Worry: Not on file    Inability: Not on file  . Transportation needs    Medical: Not on file    Non-medical: Not on file  Tobacco Use  . Smoking status: Former Smoker    Quit date: 04/16/1985    Years since quitting: 33.8  . Smokeless tobacco: Never Used  . Tobacco comment: quit 32 years ago  Substance and Sexual Activity  . Alcohol use: Not Currently    Comment: social drinker- no ETOH since December  . Drug use: Never  . Sexual activity: Not on file  Lifestyle  . Physical activity    Days per week: Not on file    Minutes per session: Not on file  . Stress: Not on file  Relationships  . Social Herbalist on phone: Not on file    Gets together: Not on file    Attends religious service: Not on file    Active member of club or organization: Not on file    Attends meetings of clubs or organizations: Not on file    Relationship status: Not on file  . Intimate partner violence    Fear of current or ex partner: Not on file    Emotionally abused: Not on file    Physically abused: Not on file    Forced sexual activity: Not on file   Other Topics Concern  . Not on file  Social History Narrative  . Not on file    Past Medical History, Surgical history, Social history, and Family history were reviewed and updated as appropriate.   Please see review of systems for further details on the patient's review from today.   Review of Systems:  Review of Systems  Constitutional: Positive for appetite change. Negative for chills, diaphoresis and fever.  HENT: Negative for trouble swallowing.   Respiratory: Negative for cough, choking, shortness of breath and wheezing.   Cardiovascular: Negative for chest pain and palpitations.  Gastrointestinal: Positive for nausea and vomiting. Negative for abdominal pain, constipation and diarrhea.       Soft stools  Genitourinary: Negative for decreased urine volume.  Neurological: Positive for dizziness, weakness and light-headedness. Negative for headaches.    Objective:   Physical Exam:  There were no vitals taken for this visit. ECOG: 0  Physical Exam Constitutional:      General: He is not in acute distress.    Appearance: He is not diaphoretic.  HENT:     Head: Normocephalic and atraumatic.  Eyes:     General: No scleral icterus.       Right eye: No discharge.        Left eye: No discharge.     Conjunctiva/sclera: Conjunctivae normal.  Cardiovascular:     Rate and Rhythm: Normal rate and regular rhythm.     Heart sounds: Normal heart sounds. No murmur. No friction rub. No gallop.   Pulmonary:     Effort: Pulmonary effort is normal. No respiratory distress.     Breath sounds: Normal breath sounds. No wheezing or rales.  Abdominal:     General: Bowel sounds are normal. There is no distension.     Palpations: Abdomen is soft. There is no mass.     Tenderness: There is no abdominal tenderness. There is no guarding or rebound.    Skin:    General: Skin is warm and dry.  Neurological:     Mental Status: He is alert.     Gait: Gait abnormal (The patient is ambulating  with a wheelchair. ).  Psychiatric:        Mood and Affect: Mood normal.        Behavior: Behavior normal.        Thought Content: Thought content normal.        Judgment: Judgment normal.     Lab Review:     Component Value Date/Time   NA 139 01/29/2019 1315   K 3.9 01/29/2019 1315   CL 108 01/29/2019 1315   CO2 22 01/29/2019 1315   GLUCOSE 117 (H) 01/29/2019 1315   BUN 19 01/29/2019 1315   CREATININE 2.14 (H) 01/29/2019 1315   CALCIUM 8.3 (L) 01/29/2019 1315   PROT 6.0 (L) 01/29/2019 1315   ALBUMIN 2.6 (L) 01/29/2019 1315   AST 19 01/29/2019 1315   ALT 12 01/29/2019 1315   ALKPHOS 95 01/29/2019 1315   BILITOT 0.4 01/29/2019 1315   GFRNONAA 30 (L) 01/29/2019 1315   GFRAA 35 (L) 01/29/2019 1315       Component Value Date/Time   WBC 13.2 (H) 01/29/2019 1315   WBC 8.1 12/25/2018 1049   RBC 2.99 (L) 01/29/2019 1315   HGB 7.8 (L) 01/29/2019 1315   HCT 25.6 (L) 01/29/2019 1315   PLT 400 01/29/2019 1315   MCV 85.6 01/29/2019 1315   MCH 26.1 01/29/2019 1315   MCHC 30.5 01/29/2019 1315   RDW 22.5 (H) 01/29/2019 1315   LYMPHSABS 1.2 01/29/2019 1315   MONOABS 1.4 (H) 01/29/2019 1315   EOSABS 0.3 01/29/2019 1315   BASOSABS 0.1 01/29/2019 1315   -------------------------------  Imaging from last 24 hours (if applicable):  Radiology interpretation: No results found.     This case was discussed with Dr. Benay Spice. He expressed agreement with my management of this patient.

## 2019-02-12 ENCOUNTER — Other Ambulatory Visit: Payer: Self-pay

## 2019-02-12 ENCOUNTER — Telehealth: Payer: Self-pay | Admitting: *Deleted

## 2019-02-12 ENCOUNTER — Inpatient Hospital Stay: Payer: PRIVATE HEALTH INSURANCE

## 2019-02-12 ENCOUNTER — Other Ambulatory Visit: Payer: Self-pay | Admitting: *Deleted

## 2019-02-12 ENCOUNTER — Inpatient Hospital Stay: Payer: PRIVATE HEALTH INSURANCE | Admitting: Oncology

## 2019-02-12 VITALS — BP 123/89 | HR 119 | Temp 98.2°F | Resp 18 | Ht 68.0 in | Wt 133.1 lb

## 2019-02-12 DIAGNOSIS — Z5111 Encounter for antineoplastic chemotherapy: Secondary | ICD-10-CM | POA: Diagnosis not present

## 2019-02-12 DIAGNOSIS — C187 Malignant neoplasm of sigmoid colon: Secondary | ICD-10-CM

## 2019-02-12 DIAGNOSIS — C772 Secondary and unspecified malignant neoplasm of intra-abdominal lymph nodes: Secondary | ICD-10-CM

## 2019-02-12 DIAGNOSIS — Z95828 Presence of other vascular implants and grafts: Secondary | ICD-10-CM

## 2019-02-12 DIAGNOSIS — E860 Dehydration: Secondary | ICD-10-CM

## 2019-02-12 LAB — CBC WITH DIFFERENTIAL (CANCER CENTER ONLY)
Abs Immature Granulocytes: 0.12 10*3/uL — ABNORMAL HIGH (ref 0.00–0.07)
Basophils Absolute: 0 10*3/uL (ref 0.0–0.1)
Basophils Relative: 0 %
Eosinophils Absolute: 0 10*3/uL (ref 0.0–0.5)
Eosinophils Relative: 0 %
HCT: 29.8 % — ABNORMAL LOW (ref 39.0–52.0)
Hemoglobin: 9.2 g/dL — ABNORMAL LOW (ref 13.0–17.0)
Immature Granulocytes: 1 %
Lymphocytes Relative: 6 %
Lymphs Abs: 0.7 10*3/uL (ref 0.7–4.0)
MCH: 25.6 pg — ABNORMAL LOW (ref 26.0–34.0)
MCHC: 30.9 g/dL (ref 30.0–36.0)
MCV: 83 fL (ref 80.0–100.0)
Monocytes Absolute: 1.5 10*3/uL — ABNORMAL HIGH (ref 0.1–1.0)
Monocytes Relative: 12 %
Neutro Abs: 10.2 10*3/uL — ABNORMAL HIGH (ref 1.7–7.7)
Neutrophils Relative %: 81 %
Platelet Count: 485 10*3/uL — ABNORMAL HIGH (ref 150–400)
RBC: 3.59 MIL/uL — ABNORMAL LOW (ref 4.22–5.81)
RDW: 21.4 % — ABNORMAL HIGH (ref 11.5–15.5)
WBC Count: 12.5 10*3/uL — ABNORMAL HIGH (ref 4.0–10.5)
nRBC: 0 % (ref 0.0–0.2)

## 2019-02-12 LAB — CMP (CANCER CENTER ONLY)
ALT: 15 U/L (ref 0–44)
AST: 20 U/L (ref 15–41)
Albumin: 3.1 g/dL — ABNORMAL LOW (ref 3.5–5.0)
Alkaline Phosphatase: 135 U/L — ABNORMAL HIGH (ref 38–126)
Anion gap: 19 — ABNORMAL HIGH (ref 5–15)
BUN: 52 mg/dL — ABNORMAL HIGH (ref 8–23)
CO2: 23 mmol/L (ref 22–32)
Calcium: 9.5 mg/dL (ref 8.9–10.3)
Chloride: 103 mmol/L (ref 98–111)
Creatinine: 3.32 mg/dL (ref 0.61–1.24)
GFR, Est AFR Am: 21 mL/min — ABNORMAL LOW (ref >60)
GFR, Estimated: 18 mL/min — ABNORMAL LOW (ref >60)
Glucose, Bld: 159 mg/dL — ABNORMAL HIGH (ref 70–99)
Potassium: 3.7 mmol/L (ref 3.5–5.1)
Sodium: 145 mmol/L (ref 135–145)
Total Bilirubin: 1 mg/dL (ref 0.3–1.2)
Total Protein: 7.5 g/dL (ref 6.5–8.1)

## 2019-02-12 LAB — MAGNESIUM: Magnesium: 2.1 mg/dL (ref 1.7–2.4)

## 2019-02-12 MED ORDER — SODIUM CHLORIDE 0.9 % IV SOLN
Freq: Once | INTRAVENOUS | Status: AC
  Administered 2019-02-12: 10:00:00 via INTRAVENOUS
  Filled 2019-02-12: qty 250

## 2019-02-12 MED ORDER — HEPARIN SOD (PORK) LOCK FLUSH 100 UNIT/ML IV SOLN
500.0000 [IU] | Freq: Once | INTRAVENOUS | Status: AC | PRN
  Administered 2019-02-12: 500 [IU]
  Filled 2019-02-12: qty 5

## 2019-02-12 MED ORDER — SODIUM CHLORIDE 0.9% FLUSH
10.0000 mL | INTRAVENOUS | Status: DC | PRN
  Administered 2019-02-12: 10 mL
  Filled 2019-02-12: qty 10

## 2019-02-12 NOTE — Telephone Encounter (Signed)
Called to report he has no appointment for tomorrow. He vomited X 1 when he got home as well. MD notified and scheduler notified as to need for appointments tomorrow.

## 2019-02-12 NOTE — Patient Instructions (Signed)
Dehydration, Adult  Dehydration is when there is not enough fluid or water in your body. This happens when you lose more fluids than you take in. Dehydration can range from mild to very bad. It should be treated right away to keep it from getting very bad. Symptoms of mild dehydration may include:  Thirst.  Dry lips.  Slightly dry mouth.  Dry, warm skin.  Dizziness. Symptoms of moderate dehydration may include:  Very dry mouth.  Muscle cramps.  Dark pee (urine). Pee may be the color of tea.  Your body making less pee.  Your eyes making fewer tears.  Heartbeat that is uneven or faster than normal (palpitations).  Headache.  Light-headedness, especially when you stand up from sitting.  Fainting (syncope). Symptoms of very bad dehydration may include:  Changes in skin, such as: ? Cold and clammy skin. ? Blotchy (mottled) or pale skin. ? Skin that does not quickly return to normal after being lightly pinched and let go (poor skin turgor).  Changes in body fluids, such as: ? Feeling very thirsty. ? Your eyes making fewer tears. ? Not sweating when body temperature is high, such as in hot weather. ? Your body making very little pee.  Changes in vital signs, such as: ? Weak pulse. ? Pulse that is more than 100 beats a minute when you are sitting still. ? Fast breathing. ? Low blood pressure.  Other changes, such as: ? Sunken eyes. ? Cold hands and feet. ? Confusion. ? Lack of energy (lethargy). ? Trouble waking up from sleep. ? Short-term weight loss. ? Unconsciousness. Follow these instructions at home:   If told by your doctor, drink an ORS: ? Make an ORS by using instructions on the package. ? Start by drinking small amounts, about  cup (120 mL) every 5-10 minutes. ? Slowly drink more until you have had the amount that your doctor said to have.  Drink enough clear fluid to keep your pee clear or pale yellow. If you were told to drink an ORS, finish the  ORS first, then start slowly drinking clear fluids. Drink fluids such as: ? Water. Do not drink only water by itself. Doing that can make the salt (sodium) level in your body get too low (hyponatremia). ? Ice chips. ? Fruit juice that you have added water to (diluted). ? Low-calorie sports drinks.  Avoid: ? Alcohol. ? Drinks that have a lot of sugar. These include high-calorie sports drinks, fruit juice that does not have water added, and soda. ? Caffeine. ? Foods that are greasy or have a lot of fat or sugar.  Take over-the-counter and prescription medicines only as told by your doctor.  Do not take salt tablets. Doing that can make the salt level in your body get too high (hypernatremia).  Eat foods that have minerals (electrolytes). Examples include bananas, oranges, potatoes, tomatoes, and spinach.  Keep all follow-up visits as told by your doctor. This is important. Contact a doctor if:  You have belly (abdominal) pain that: ? Gets worse. ? Stays in one area (localizes).  You have a rash.  You have a stiff neck.  You get angry or annoyed more easily than normal (irritability).  You are more sleepy than normal.  You have a harder time waking up than normal.  You feel: ? Weak. ? Dizzy. ? Very thirsty.  You have peed (urinated) only a small amount of very dark pee during 6-8 hours. Get help right away if:  You have   symptoms of very bad dehydration.  You cannot drink fluids without throwing up (vomiting).  Your symptoms get worse with treatment.  You have a fever.  You have a very bad headache.  You are throwing up or having watery poop (diarrhea) and it: ? Gets worse. ? Does not go away.  You have blood or something green (bile) in your throw-up.  You have blood in your poop (stool). This may cause poop to look black and tarry.  You have not peed in 6-8 hours.  You pass out (faint).  Your heart rate when you are sitting still is more than 100 beats a  minute.  You have trouble breathing. This information is not intended to replace advice given to you by your health care provider. Make sure you discuss any questions you have with your health care provider. Document Released: 01/27/2009 Document Revised: 03/15/2017 Document Reviewed: 05/27/2015 Elsevier Patient Education  2020 Elsevier Inc[employer]  

## 2019-02-12 NOTE — Progress Notes (Signed)
[facility] OFFICE PROGRESS NOTE   Diagnosis: Colon cancer  INTERVAL HISTORY:   Miguel Gamble completed another cycle of FOLFIRI on 01/29/2019.  He reports loose stool, but no change in the stool volume from baseline.  He developed nausea and vomiting beginning 2 days ago.  He was seen in the symptom management clinic yesterday with evidence of dehydration.  He was treated with 2 L of intravenous fluids.  He reports the nausea has resolved.  He feels the nausea was related to dehydration.  Objective:  Vital signs in last 24 hours:  Blood pressure 123/89, pulse (!) 119, temperature 98.2 F (36.8 C), temperature source Temporal, resp. rate 18, height 5' 8"  (1.727 m), weight 133 lb 1.6 oz (60.4 kg), SpO2 100 %.    HEENT: No thrush or ulcers Resp: Lungs clear bilaterally Cardio: Regular rate and rhythm GI: No hepatomegaly, left lower quadrant colostomy with a small amount of loose stool Vascular: No leg edema  Skin: Diminished skin turgor  Portacath/PICC-without erythema  Lab Results:  Lab Results  Component Value Date   WBC 12.5 (H) 02/12/2019   HGB 9.2 (L) 02/12/2019   HCT 29.8 (L) 02/12/2019   MCV 83.0 02/12/2019   PLT 485 (H) 02/12/2019   NEUTROABS 10.2 (H) 02/12/2019    CMP  Lab Results  Component Value Date   NA 145 02/12/2019   K 3.7 02/12/2019   CL 103 02/12/2019   CO2 23 02/12/2019   GLUCOSE 159 (H) 02/12/2019   BUN 52 (H) 02/12/2019   CREATININE 3.32 (HH) 02/12/2019   CALCIUM 9.5 02/12/2019   PROT 7.5 02/12/2019   ALBUMIN 3.1 (L) 02/12/2019   AST 20 02/12/2019   ALT 15 02/12/2019   ALKPHOS 135 (H) 02/12/2019   BILITOT 1.0 02/12/2019   GFRNONAA 18 (L) 02/12/2019   GFRAA 21 (L) 02/12/2019    Lab Results  Component Value Date   CEA1 58.41 (H) 02/11/2019    Medications: I have reviewed the patient's current medications.   Assessment/Plan:  1.Metastatic colon cancer-abdomen/pelvic mass appears to arise from the sigmoid  colon  Noncontrast CT 04/14/2018-abdomen/pelvic mass, partial colonic and small bowel obstruction, fistula between the mass and small bowel loops, multiple bladder tumors, left hydronephrosis, bilateral lung metastases, lymphatic tumor spread in the lungs versus pneumonia, precarinal lymph node, retroperitoneal/mesenteric lymphadenopathy  Resection of multiple bladder masses 04/15/2018-adenocarcinoma consistent with a colonic primary, MSS,tumor mutation burden 10, KRAS G12D, No BRAF mutation  Cycle 1 FOLFOX 05/07/2018  Cycle 2 FOLFOX 05/22/2018  Cycle 3 FOLFOX 06/05/2018 (oxaliplatin dose reduced due to increased creatinine)  Cycle 4 FOLFOX 06/18/2018  Cycle 5 FOLFOX 07/03/2018  CT 07/09/2018-no significant change in lung nodules, resolution ofairspace disease, decreased size of liver metastases, near complete resolution of bowel obstruction, new diverting colostomy  Cycle 6 FOLFOX 07/17/2018  Cycle 7 FOLFOX 07/31/2018  Cycle 8 FOLFOX 08/14/2018  Cycle 9 FOLFOX 08/28/2018 (oxaliplatin held due to mild neutropenia)  Cycle 10 FOLFOX 09/11/2018  CT 09/22/2018-enlargement of lung nodules, stable indeterminate liver lesions, similar mass in the distal sigmoid colon, 17 mm polypoid lesion seen in the left bladder, persistent small bowel distention proximal to an area of small bowel involvement by direct tumor extension  Cycle 1 FOLFIRI 10/09/2018  Cycle 2 FOLFIRI 10/23/2018  Cycle 3 FOLFIRI 11/05/2018  Cycle 4 FOLFIRI 11/20/2018-Udenyca added  Cycle 5 FOLFIRI 12/04/2018  CTs 12/16/2018-mild interval improvement in bilateral pulmonary metastases.  Stable mild right paratracheal lymphadenopathy.  Mild decrease in size of dominant liver  metastasis hepatic dome.  Stable subcentimeter lymph node small bowel mesentery and left periaortic region.  No significant change in bulky central pelvic soft tissue mass involving the sigmoid colon.  Stable mild diffuse bladder wall thickening.  Decreased size of  intraluminal soft tissue density involving the left posterior bladder wall.  No new or progressive metastatic disease identified.  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 He will follow-up with Dr. Ninfa Linden. Status post right inguinal hernia repair with mesh 06/23/2018.  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 Oxaliplatin neuropathy-improved  9.  Diarrhea following cycle 7 FOLFIRI and cycle 8 FOLFIRI    Disposition: Miguel Gamble was scheduled for FOLFIRI chemotherapy today.  He presented yesterday with nausea/vomiting dehydration.  He continues to have evidence of dehydration.  Chemotherapy will be held today.  He will receive intravenous fluids today and tomorrow.  We will repeat a chemistry panel is here tomorrow.  Miguel Gamble will be scheduled for an office visit in the next cycle of chemotherapy in 1 week.  The 5-FU and irinotecan will be dose reduced with the next cycle.  Betsy Coder, MD  02/12/2019  2:13 PM

## 2019-02-12 NOTE — Progress Notes (Signed)
CRITICAL VALUE STICKER  CRITICAL VALUE: Creatinine 3.32  RECEIVER (on-site recipient of call): Mera Gunkel,RN   DATE & TIME NOTIFIED: 02/12/19 @ 0928   MESSENGER (representative from lab): Marie  MD NOTIFIED: Dr. Benay Spice  TIME OF NOTIFICATION:0929  RESPONSE: NO chemo today--IV fluids and will repeat on 11/30 with Bmet

## 2019-02-12 NOTE — Progress Notes (Signed)
Per Dr. Benay Spice: No chemo today due to elevated creatinine and performance status. Will receive 1 liter NS and repeat on 11/30 w/BMP

## 2019-02-13 ENCOUNTER — Ambulatory Visit: Payer: PRIVATE HEALTH INSURANCE

## 2019-02-13 ENCOUNTER — Other Ambulatory Visit: Payer: PRIVATE HEALTH INSURANCE

## 2019-02-15 DEATH — deceased

## 2019-02-18 ENCOUNTER — Other Ambulatory Visit: Payer: PRIVATE HEALTH INSURANCE

## 2019-02-18 ENCOUNTER — Ambulatory Visit: Payer: PRIVATE HEALTH INSURANCE

## 2019-02-23 ENCOUNTER — Telehealth: Payer: Self-pay | Admitting: *Deleted

## 2019-02-23 NOTE — Telephone Encounter (Signed)
Records faxed to [facility] - release [id number]

## 2020-10-28 IMAGING — CT CT ABD-PELV W/O CM
2 of 4 series · 11 of 36 positions shown, 13 images · non-contrast
Comparison: None.

CLINICAL DATA: Chronic weight loss. Abnormal radiograph. Further
evaluation requested.

EXAM:
CT CHEST, ABDOMEN AND PELVIS WITHOUT CONTRAST
TECHNIQUE: Multidetector CT imaging of the chest, abdomen and pelvis was
performed following the standard protocol without IV contrast.

[Series 2: cap w/o · axial · non-contrast · 0.66mm/px · z∈[-526,+19]mm · 8 of 129 slices shown, 10 images]
[im 10/129  mediastinal]
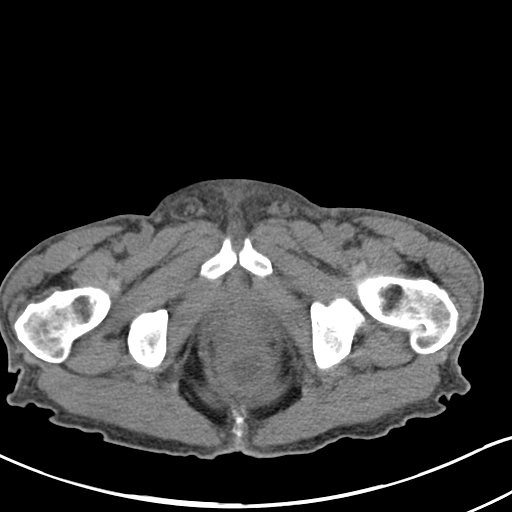
[im 10/129  lung]
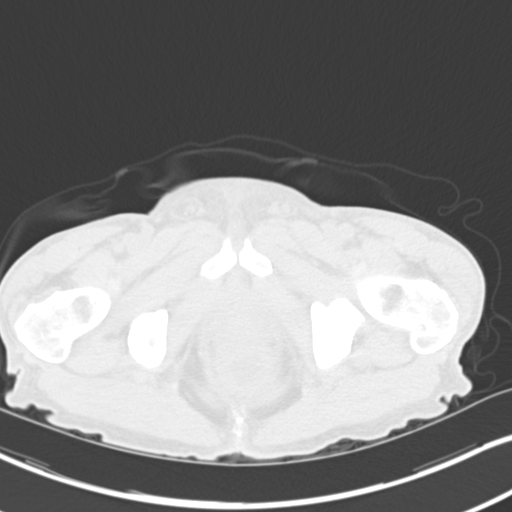
[im 30/129  lung]
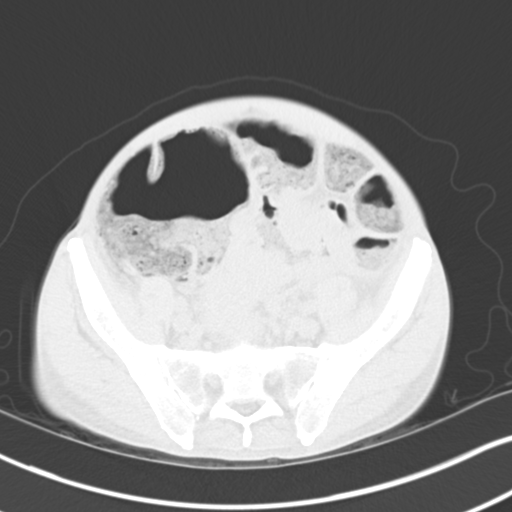
[im 40/129  lung]
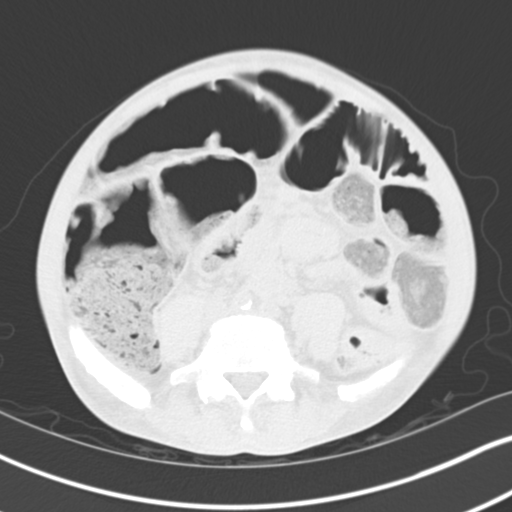
[im 60/129  lung]
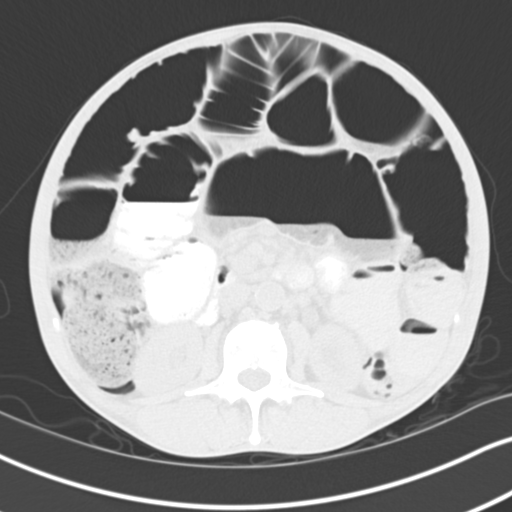
[im 69/129  mediastinal]
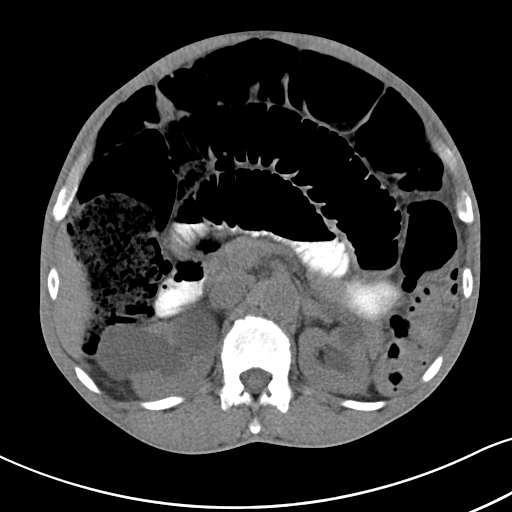
[im 69/129  lung]
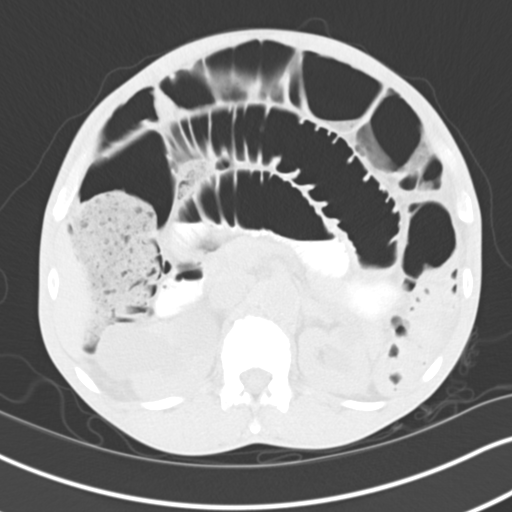
[im 89/129  lung]
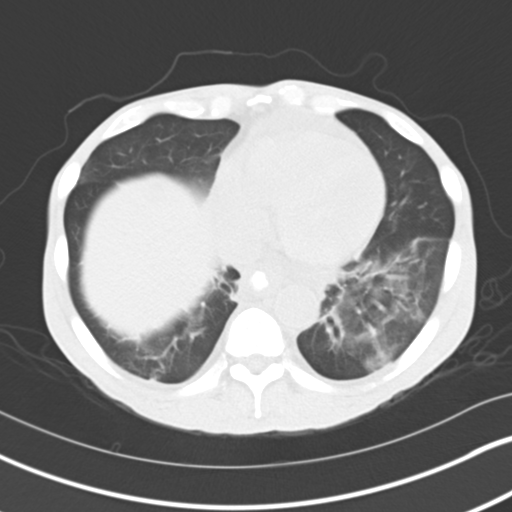
[im 99/129  lung]
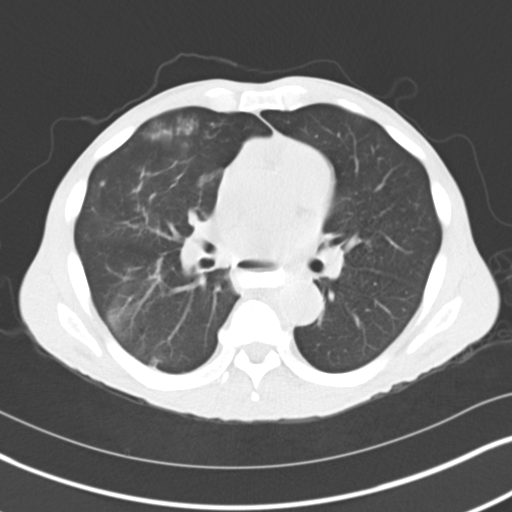
[im 119/129  lung]
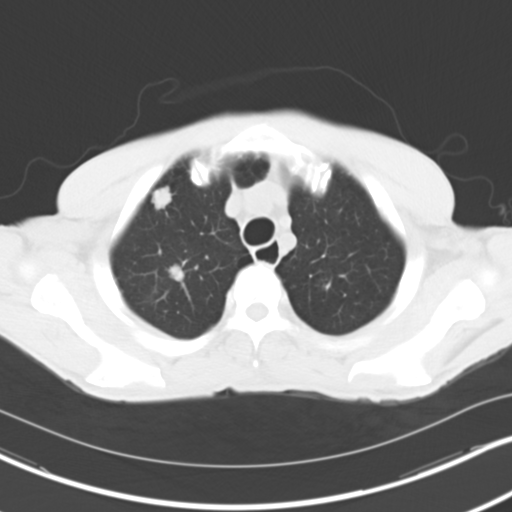

[Series 5: coronals · coronal · 0.69mm/px · 3 of 144 slices shown]
[im 29/144  lung]
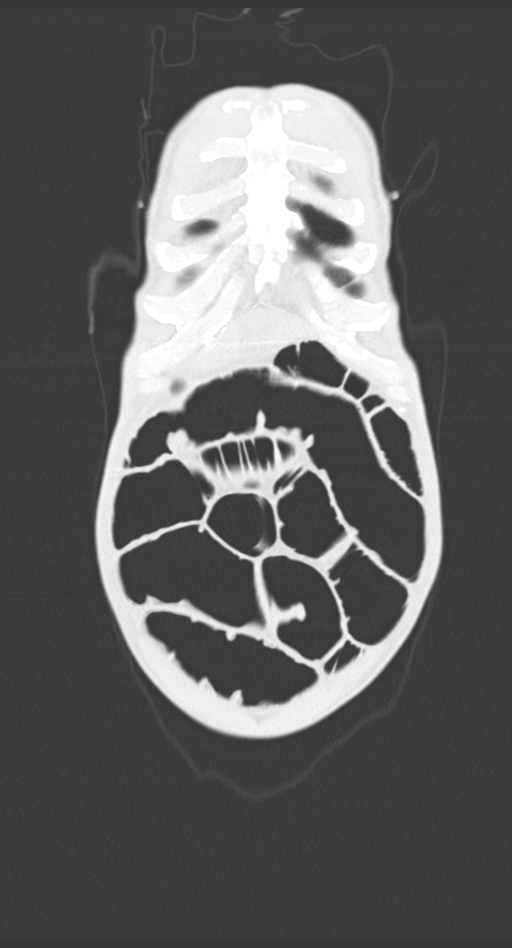
[im 58/144  lung]
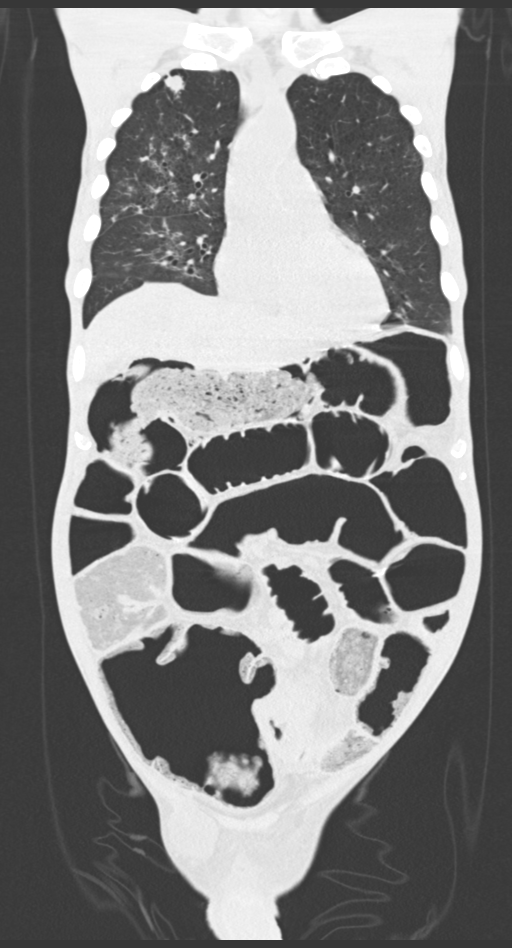
[im 86/144  lung]
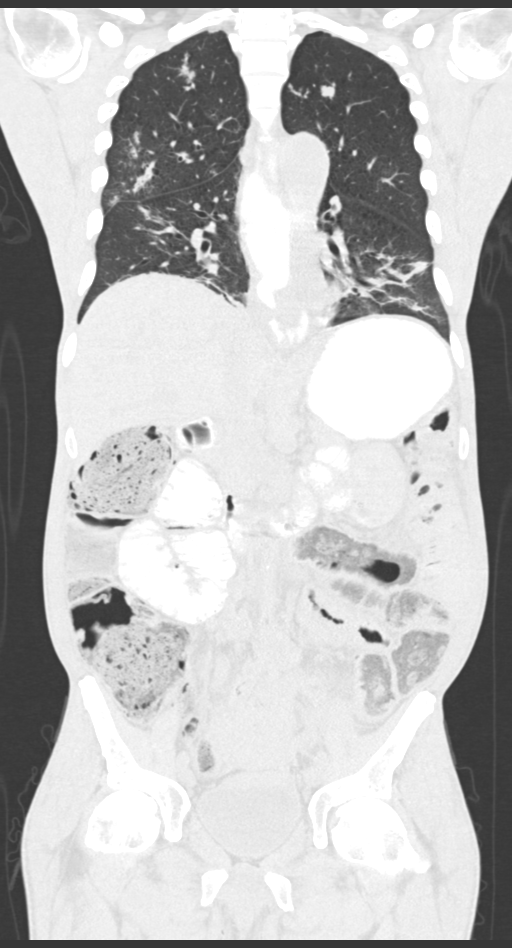

[11 of 36 positions shown; findings below may reference images not displayed]

FINDINGS: CT CHEST FINDINGS

Cardiovascular: The heart is normal in size. Mild calcification is
noted at the aortic arch. The great vessels are unremarkable in
appearance.

Mediastinum/Nodes: The esophagus is filled with contrast, which may
reflect gastroesophageal reflux or esophageal dysmotility. Wall
thickening along the esophagus may reflect esophagitis or possibly
metastatic disease. Mild achalasia cannot be excluded.

Mild coronary artery calcification is seen. An enlarged 1.8 cm
precarinal node is suspicious for metastatic disease. A trace
pericardial effusion is noted. The visualized portions of the
thyroid gland are unremarkable. No axillary lymphadenopathy is seen.

Lungs/Pleura: Patchy bilateral airspace opacities are noted,
predominantly at the right upper lobe and left lower lobe,
concerning for pneumonia. Lymphangitic spread of tumor could
conceivably have a similar appearance. Multiple large pulmonary
nodules are noted bilaterally, measuring up to 1.7 cm in size,
compatible with metastatic disease. No pleural effusion or
pneumothorax is seen.

Musculoskeletal: No acute osseous abnormalities are identified. The
visualized musculature is unremarkable in appearance.

CT ABDOMEN PELVIS FINDINGS

Hepatobiliary: Vague lesions within the liver measure up to 3.5 cm
in size, compatible with metastatic disease. The gallbladder is not
definitely characterized. The common bile duct is normal in caliber.

Pancreas: The pancreas is within normal limits. Peripancreatic nodes
measure up to 1.4 cm in short axis, concerning for metastatic
disease.

Spleen: The spleen is diminutive and unremarkable in appearance.

Adrenals/Urinary Tract: The adrenal glands are grossly unremarkable
in appearance.

Scattered bilateral renal cysts are noted, more prominent on the
right. There is mild chronic left-sided hydronephrosis, with
prominence of the proximal left ureter to the level of the lower
abdomen, reflecting obstruction by the large lower abdominal and
pelvic mass.

No renal or ureteral stones are identified. Mild left-sided
perinephric stranding is noted.

Stomach/Bowel: There is diffuse dilatation of small-bowel loops to
6.0 cm in diameter, and distention of the cecum to 12.0 cm in
diameter. The small and large bowel are diffusely dilated. The
stomach is largely filled with contrast. Distention extends to the
level of the proximal sigmoid colon.

A very large complex mass is noted at the mid to lower abdomen and
pelvis, which appears to include a fistula between multiple loops of
small bowel, the cecum and sigmoid colon, with stool in the upper
aspect of the mass. Wall thickening is noted along the adjacent
small and large bowel loops, and the mass measures approximately 16
x 9 cm. Wall thickening extends to the rectum, with diffuse
presacral stranding. This most likely arises from the sigmoid colon,
though a small bowel or bladder source is also possible.

Vascular/Lymphatic: Scattered calcification is seen along the
abdominal aorta and its branches. The abdominal aorta is otherwise
grossly unremarkable. The inferior vena cava is grossly
unremarkable.

Scattered enlarged mesenteric and retroperitoneal nodes are seen,
measuring up to 1.5 cm in short axis. Vague mass tracks directly
anterior to the aortic bifurcation and along the pelvic sidewall
bilaterally. A 1.9 cm node is seen along the left external iliac
vessels.

Reproductive: Multiple masses are noted within the bladder, with
diffuse irregular bladder wall thickening at the dome of the
bladder, reflecting diffuse spread of disease. The prostate is
borderline enlarged, measuring 4.9 cm in transverse dimension.

Other: Trace fluid is noted at a small right inguinal hernia.

Musculoskeletal: No acute osseous abnormalities are identified. The
visualized musculature is unremarkable in appearance.
IMPRESSION: 1. Very large complex mass at the mid to lower abdomen and pelvis,
which appears to include a fistula between multiple loops of small
bowel, the cecum and sigmoid colon, with stool in the upper aspect
of the mass. The mass measures 16 x 9 cm. Wall thickening extends
along the adjacent small and large bowel loops. This most likely
reflects a sigmoid colonic primary malignancy, though a small bowel
or bladder source is also possible.
2. Marked diffuse dilatation of the small and large bowel loops,
reflecting partial bowel obstruction by the mass.
3. Extensive retroperitoneal, mesenteric and pelvic sidewall
metastatic lymphadenopathy noted. Enlarged mediastinal node also
noted, concerning for metastatic disease.
4. Multiple pulmonary nodules noted bilaterally, compatible with
metastatic disease.
5. Wall thickening along the esophagus may reflect esophagitis or
possibly metastatic disease. Esophagus filled with contrast,
possibly reflecting gastroesophageal reflux or esophageal
dysmotility. Mild achalasia cannot be excluded.
6. Mild chronic left-sided hydronephrosis, reflecting obstruction by
the large lower abdominal and pelvic mass.
7. Multiple masses within the bladder, with diffuse irregular
bladder wall thickening at the dome of the bladder, reflecting
diffuse spread of malignancy.
8. Scattered hepatic metastases noted.
9. Patchy bilateral airspace opacities, predominantly at the right
upper lobe and left lower lobe, concerning for pneumonia.
Lymphangitic spread of tumor could conceivably have a similar
appearance.
10. Bilateral renal cysts.
11. Trace pericardial effusion noted.

Aortic Atherosclerosis ([id number]-FQA.A).

These results were called by telephone at the time of interpretation
on 04/14/2018 at [DATE] to NICCI ROHDE PA, who verbally
acknowledged these results.

## 2020-10-29 IMAGING — DX DG ABDOMEN 1V
1 series · 1 of 1 positions shown · non-contrast
Comparison: CT abdomen pelvis April 14, 2018

CLINICAL DATA: Encounter for nasogastric tube placement.

EXAM:
ABDOMEN - 1 VIEW

[abdomen kub]
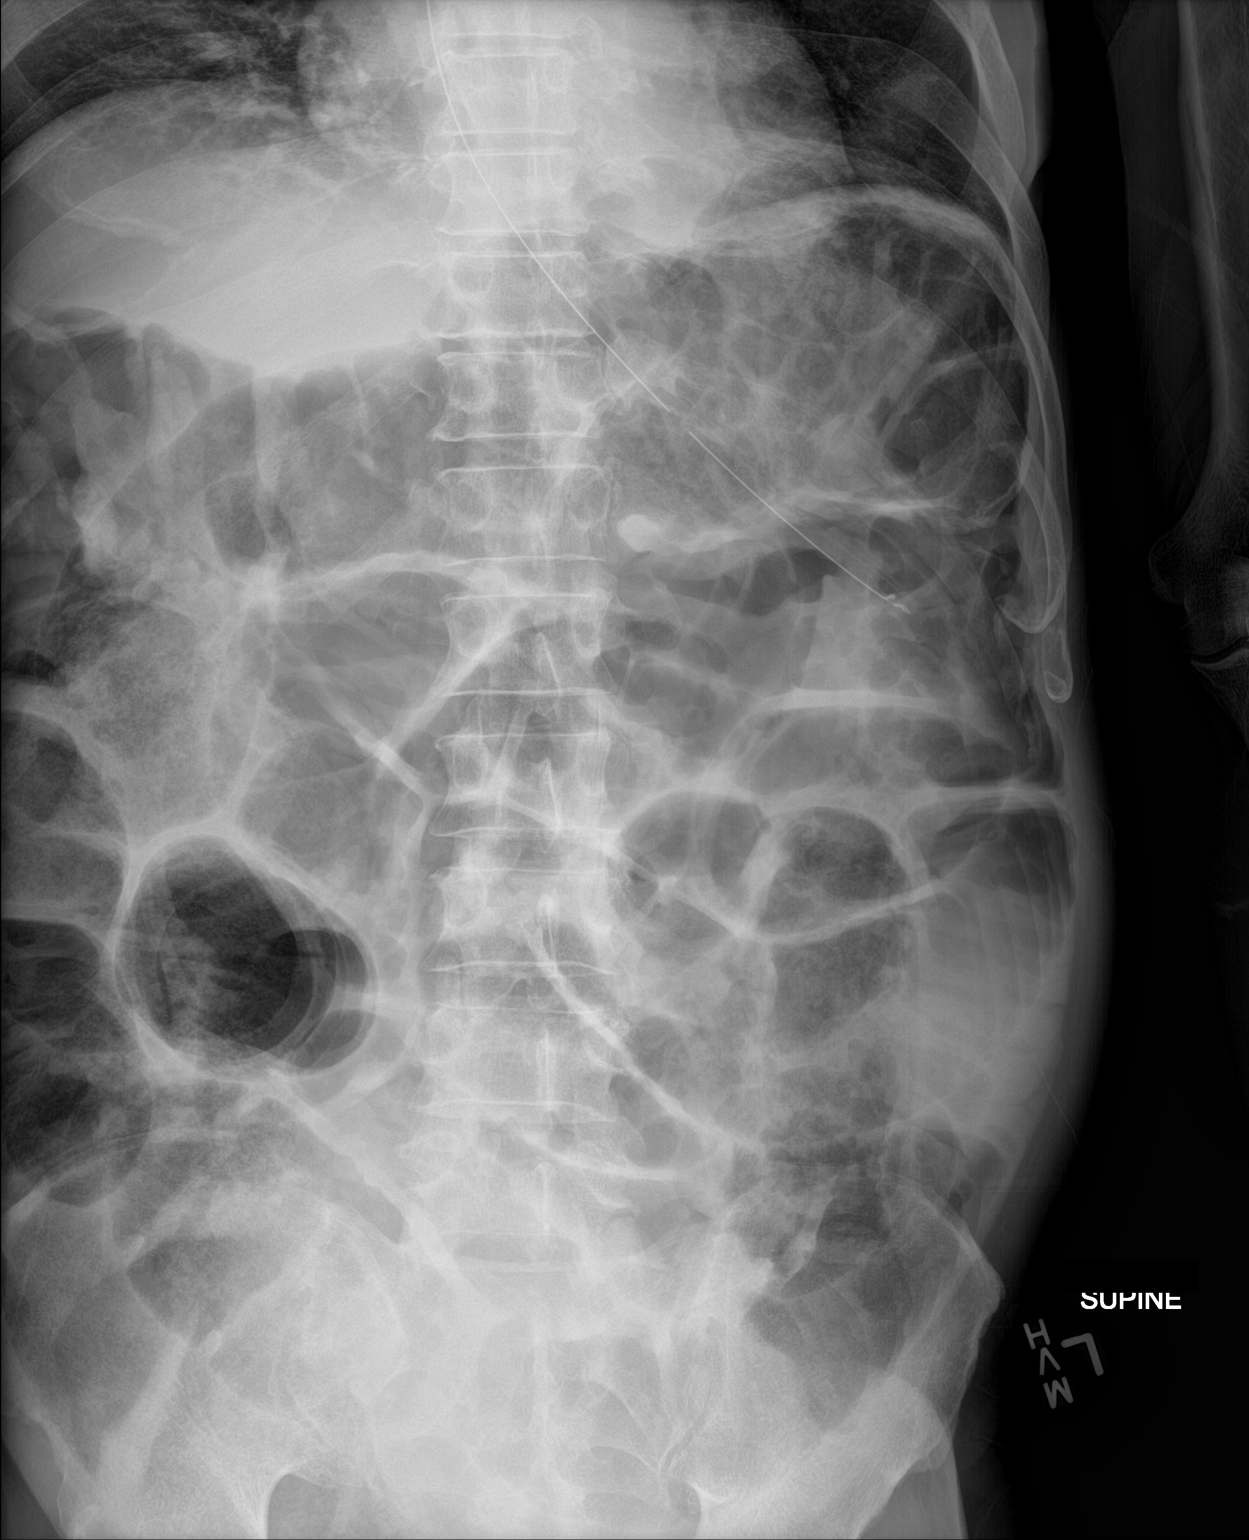

[1 of 1 positions shown; findings below may reference images not displayed]

FINDINGS: Multiple dilated small bowel loops are identified throughout abdomen
and pelvis. Nasogastric tube is identified with distal tip in the
proximal stomach.
IMPRESSION: Nasogastric tube is identified with distal tip in the proximal
stomach.

Multiple dilated small bowel loops are identified throughout abdomen
and pelvis as seen on prior recent CT.
